# Patient Record
Sex: Male | Born: 1940
Health system: Southern US, Community
[De-identification: ages and names within clinical notes are randomized; demographics above are authoritative.]

## PROBLEM LIST (undated history)

## (undated) DIAGNOSIS — Z9889 Other specified postprocedural states: Secondary | ICD-10-CM

## (undated) DIAGNOSIS — K222 Esophageal obstruction: Secondary | ICD-10-CM

## (undated) DIAGNOSIS — T4145XA Adverse effect of unspecified anesthetic, initial encounter: Secondary | ICD-10-CM

## (undated) DIAGNOSIS — Z8719 Personal history of other diseases of the digestive system: Secondary | ICD-10-CM

## (undated) DIAGNOSIS — I4510 Unspecified right bundle-branch block: Secondary | ICD-10-CM

## (undated) DIAGNOSIS — Z955 Presence of coronary angioplasty implant and graft: Secondary | ICD-10-CM

## (undated) DIAGNOSIS — K219 Gastro-esophageal reflux disease without esophagitis: Secondary | ICD-10-CM

## (undated) DIAGNOSIS — Z860100 Personal history of colon polyps, unspecified: Secondary | ICD-10-CM

## (undated) DIAGNOSIS — G4733 Obstructive sleep apnea (adult) (pediatric): Secondary | ICD-10-CM

## (undated) DIAGNOSIS — T8859XA Other complications of anesthesia, initial encounter: Secondary | ICD-10-CM

## (undated) DIAGNOSIS — I10 Essential (primary) hypertension: Secondary | ICD-10-CM

## (undated) DIAGNOSIS — R112 Nausea with vomiting, unspecified: Secondary | ICD-10-CM

## (undated) DIAGNOSIS — R399 Unspecified symptoms and signs involving the genitourinary system: Secondary | ICD-10-CM

## (undated) DIAGNOSIS — I2510 Atherosclerotic heart disease of native coronary artery without angina pectoris: Secondary | ICD-10-CM

## (undated) DIAGNOSIS — Z8679 Personal history of other diseases of the circulatory system: Secondary | ICD-10-CM

## (undated) DIAGNOSIS — N400 Enlarged prostate without lower urinary tract symptoms: Secondary | ICD-10-CM

## (undated) DIAGNOSIS — K589 Irritable bowel syndrome without diarrhea: Secondary | ICD-10-CM

## (undated) DIAGNOSIS — I2109 ST elevation (STEMI) myocardial infarction involving other coronary artery of anterior wall: Secondary | ICD-10-CM

## (undated) DIAGNOSIS — M1990 Unspecified osteoarthritis, unspecified site: Secondary | ICD-10-CM

## (undated) DIAGNOSIS — E785 Hyperlipidemia, unspecified: Secondary | ICD-10-CM

## (undated) HISTORY — DX: Obstructive sleep apnea (adult) (pediatric): G47.33

## (undated) HISTORY — PX: CORONARY ANGIOPLASTY WITH STENT PLACEMENT: SHX49

## (undated) HISTORY — PX: CARDIOVASCULAR STRESS TEST: SHX262

## (undated) HISTORY — DX: Atherosclerotic heart disease of native coronary artery without angina pectoris: I25.10

## (undated) HISTORY — DX: Essential (primary) hypertension: I10

## (undated) HISTORY — DX: Unspecified osteoarthritis, unspecified site: M19.90

## (undated) HISTORY — DX: Gastro-esophageal reflux disease without esophagitis: K21.9

## (undated) HISTORY — PX: CHOLECYSTECTOMY: SHX55

## (undated) HISTORY — PX: APPENDECTOMY: SHX54

---

## 2000-11-05 ENCOUNTER — Encounter: Payer: Self-pay | Admitting: *Deleted

## 2000-11-05 ENCOUNTER — Emergency Department (HOSPITAL_COMMUNITY): Admission: EM | Admit: 2000-11-05 | Discharge: 2000-11-05 | Payer: Self-pay | Admitting: *Deleted

## 2000-12-28 ENCOUNTER — Encounter: Payer: Self-pay | Admitting: Neurosurgery

## 2000-12-28 ENCOUNTER — Encounter: Admission: RE | Admit: 2000-12-28 | Discharge: 2000-12-28 | Payer: Self-pay | Admitting: Neurosurgery

## 2001-01-22 ENCOUNTER — Encounter: Payer: Self-pay | Admitting: Neurosurgery

## 2001-01-22 ENCOUNTER — Encounter: Admission: RE | Admit: 2001-01-22 | Discharge: 2001-01-22 | Payer: Self-pay | Admitting: Neurosurgery

## 2001-02-04 ENCOUNTER — Encounter: Payer: Self-pay | Admitting: Neurosurgery

## 2001-02-04 ENCOUNTER — Encounter: Admission: RE | Admit: 2001-02-04 | Discharge: 2001-02-04 | Payer: Self-pay | Admitting: Neurosurgery

## 2001-04-28 ENCOUNTER — Ambulatory Visit (HOSPITAL_COMMUNITY): Admission: RE | Admit: 2001-04-28 | Discharge: 2001-04-28 | Payer: Self-pay | Admitting: Internal Medicine

## 2001-11-24 ENCOUNTER — Encounter: Payer: Self-pay | Admitting: Emergency Medicine

## 2001-11-24 ENCOUNTER — Inpatient Hospital Stay (HOSPITAL_COMMUNITY): Admission: EM | Admit: 2001-11-24 | Discharge: 2001-11-27 | Payer: Self-pay | Admitting: Emergency Medicine

## 2001-11-25 ENCOUNTER — Encounter: Payer: Self-pay | Admitting: *Deleted

## 2002-07-20 ENCOUNTER — Encounter: Payer: Self-pay | Admitting: Orthopedic Surgery

## 2002-07-27 ENCOUNTER — Encounter: Payer: Self-pay | Admitting: Orthopedic Surgery

## 2002-07-27 ENCOUNTER — Inpatient Hospital Stay (HOSPITAL_COMMUNITY): Admission: RE | Admit: 2002-07-27 | Discharge: 2002-08-01 | Payer: Self-pay | Admitting: Orthopedic Surgery

## 2002-07-27 HISTORY — PX: TOTAL KNEE ARTHROPLASTY: SHX125

## 2003-09-07 ENCOUNTER — Inpatient Hospital Stay (HOSPITAL_COMMUNITY): Admission: RE | Admit: 2003-09-07 | Discharge: 2003-09-12 | Payer: Self-pay | Admitting: Orthopedic Surgery

## 2003-09-07 HISTORY — PX: TOTAL HIP ARTHROPLASTY: SHX124

## 2003-12-26 ENCOUNTER — Encounter: Admission: RE | Admit: 2003-12-26 | Discharge: 2003-12-26 | Payer: Self-pay | Admitting: Neurosurgery

## 2004-01-14 ENCOUNTER — Encounter: Admission: RE | Admit: 2004-01-14 | Discharge: 2004-01-14 | Payer: Self-pay | Admitting: Neurosurgery

## 2004-01-28 ENCOUNTER — Encounter: Admission: RE | Admit: 2004-01-28 | Discharge: 2004-01-28 | Payer: Self-pay | Admitting: Neurosurgery

## 2004-02-01 ENCOUNTER — Ambulatory Visit (HOSPITAL_COMMUNITY): Admission: RE | Admit: 2004-02-01 | Discharge: 2004-02-01 | Payer: Self-pay | Admitting: Pulmonary Disease

## 2004-02-09 ENCOUNTER — Ambulatory Visit (HOSPITAL_COMMUNITY): Admission: RE | Admit: 2004-02-09 | Discharge: 2004-02-09 | Payer: Self-pay | Admitting: Pulmonary Disease

## 2004-04-05 ENCOUNTER — Ambulatory Visit (HOSPITAL_COMMUNITY): Admission: RE | Admit: 2004-04-05 | Discharge: 2004-04-05 | Payer: Self-pay | Admitting: Internal Medicine

## 2004-04-05 HISTORY — PX: ESOPHAGOGASTRODUODENOSCOPY: SHX1529

## 2005-08-01 ENCOUNTER — Ambulatory Visit: Payer: Self-pay | Admitting: Cardiology

## 2005-09-13 ENCOUNTER — Ambulatory Visit: Payer: Self-pay

## 2006-03-31 ENCOUNTER — Encounter: Admission: RE | Admit: 2006-03-31 | Discharge: 2006-03-31 | Payer: Self-pay | Admitting: Neurosurgery

## 2006-07-17 ENCOUNTER — Ambulatory Visit: Payer: Self-pay | Admitting: Cardiology

## 2006-07-30 ENCOUNTER — Ambulatory Visit: Payer: Self-pay

## 2006-07-30 LAB — CONVERTED CEMR LAB
ALT: 24 units/L (ref 0–40)
AST: 28 units/L (ref 0–37)
Albumin: 3.5 g/dL (ref 3.5–5.2)
Alkaline Phosphatase: 93 units/L (ref 39–117)
BUN: 14 mg/dL (ref 6–23)
Bilirubin, Direct: 0.1 mg/dL (ref 0.0–0.3)
CO2: 28 meq/L (ref 19–32)
Calcium: 9.1 mg/dL (ref 8.4–10.5)
Chloride: 108 meq/L (ref 96–112)
Cholesterol: 109 mg/dL (ref 0–200)
Creatinine, Ser: 1.4 mg/dL (ref 0.4–1.5)
GFR calc Af Amer: 66 mL/min
GFR calc non Af Amer: 54 mL/min
Glucose, Bld: 103 mg/dL — ABNORMAL HIGH (ref 70–99)
HDL: 38 mg/dL — ABNORMAL LOW (ref >39.0)
LDL Cholesterol: 59 mg/dL (ref 0–99)
Potassium: 4.2 meq/L (ref 3.5–5.1)
Sodium: 140 meq/L (ref 135–145)
Total Bilirubin: 0.4 mg/dL (ref 0.3–1.2)
Total CHOL/HDL Ratio: 2.9
Total Protein: 6.9 g/dL (ref 6.0–8.3)
Triglycerides: 59 mg/dL (ref 0–149)
VLDL: 12 mg/dL (ref 0–40)

## 2006-07-31 ENCOUNTER — Ambulatory Visit: Payer: Self-pay

## 2006-12-05 ENCOUNTER — Ambulatory Visit (HOSPITAL_COMMUNITY): Admission: RE | Admit: 2006-12-05 | Discharge: 2006-12-05 | Payer: Self-pay | Admitting: Pulmonary Disease

## 2006-12-16 ENCOUNTER — Ambulatory Visit: Payer: Self-pay | Admitting: Orthopedic Surgery

## 2007-01-27 ENCOUNTER — Ambulatory Visit (HOSPITAL_COMMUNITY): Admission: RE | Admit: 2007-01-27 | Discharge: 2007-01-27 | Payer: Self-pay | Admitting: Pulmonary Disease

## 2007-01-29 ENCOUNTER — Ambulatory Visit: Payer: Self-pay | Admitting: Internal Medicine

## 2008-09-12 ENCOUNTER — Emergency Department (HOSPITAL_COMMUNITY): Admission: EM | Admit: 2008-09-12 | Discharge: 2008-09-12 | Payer: Self-pay | Admitting: Emergency Medicine

## 2008-10-20 DIAGNOSIS — I2510 Atherosclerotic heart disease of native coronary artery without angina pectoris: Secondary | ICD-10-CM

## 2008-10-20 DIAGNOSIS — Z9861 Coronary angioplasty status: Secondary | ICD-10-CM

## 2008-10-20 DIAGNOSIS — I4892 Unspecified atrial flutter: Secondary | ICD-10-CM

## 2008-10-20 DIAGNOSIS — Z87898 Personal history of other specified conditions: Secondary | ICD-10-CM | POA: Insufficient documentation

## 2008-10-20 DIAGNOSIS — M129 Arthropathy, unspecified: Secondary | ICD-10-CM | POA: Insufficient documentation

## 2008-10-20 DIAGNOSIS — E7800 Pure hypercholesterolemia, unspecified: Secondary | ICD-10-CM

## 2008-10-20 DIAGNOSIS — E785 Hyperlipidemia, unspecified: Secondary | ICD-10-CM | POA: Insufficient documentation

## 2008-10-20 DIAGNOSIS — M4800 Spinal stenosis, site unspecified: Secondary | ICD-10-CM

## 2008-10-20 DIAGNOSIS — I10 Essential (primary) hypertension: Secondary | ICD-10-CM | POA: Insufficient documentation

## 2008-10-20 DIAGNOSIS — R1319 Other dysphagia: Secondary | ICD-10-CM

## 2008-10-20 DIAGNOSIS — K449 Diaphragmatic hernia without obstruction or gangrene: Secondary | ICD-10-CM | POA: Insufficient documentation

## 2008-10-20 DIAGNOSIS — K219 Gastro-esophageal reflux disease without esophagitis: Secondary | ICD-10-CM

## 2008-10-21 ENCOUNTER — Ambulatory Visit: Payer: Self-pay | Admitting: Cardiology

## 2008-10-21 ENCOUNTER — Encounter: Payer: Self-pay | Admitting: Cardiology

## 2008-10-21 DIAGNOSIS — R0602 Shortness of breath: Secondary | ICD-10-CM

## 2008-10-21 LAB — CONVERTED CEMR LAB
ALT: 27 units/L (ref 0–53)
AST: 29 units/L (ref 0–37)
Albumin: 3.4 g/dL — ABNORMAL LOW (ref 3.5–5.2)
Alkaline Phosphatase: 100 units/L (ref 39–117)
BUN: 12 mg/dL (ref 6–23)
Bilirubin, Direct: 0.1 mg/dL (ref 0.0–0.3)
CO2: 28 meq/L (ref 19–32)
Calcium: 9 mg/dL (ref 8.4–10.5)
Chloride: 106 meq/L (ref 96–112)
Cholesterol: 229 mg/dL — ABNORMAL HIGH (ref 0–200)
Creatinine, Ser: 1.3 mg/dL (ref 0.4–1.5)
Direct LDL: 161.3 mg/dL
GFR calc non Af Amer: 70.85 mL/min (ref >60)
Glucose, Bld: 88 mg/dL (ref 70–99)
HDL: 47.3 mg/dL (ref >39.00)
Potassium: 4.7 meq/L (ref 3.5–5.1)
Sodium: 139 meq/L (ref 135–145)
Total Bilirubin: 0.6 mg/dL (ref 0.3–1.2)
Total CHOL/HDL Ratio: 5
Total Protein: 6.7 g/dL (ref 6.0–8.3)
Triglycerides: 83 mg/dL (ref 0.0–149.0)
VLDL: 16.6 mg/dL (ref 0.0–40.0)

## 2008-10-27 ENCOUNTER — Telehealth (INDEPENDENT_AMBULATORY_CARE_PROVIDER_SITE_OTHER): Payer: Self-pay | Admitting: *Deleted

## 2008-10-28 ENCOUNTER — Ambulatory Visit: Payer: Self-pay

## 2008-11-01 ENCOUNTER — Encounter: Payer: Self-pay | Admitting: Cardiology

## 2008-11-11 ENCOUNTER — Ambulatory Visit: Payer: Self-pay | Admitting: Internal Medicine

## 2008-11-12 DIAGNOSIS — R1013 Epigastric pain: Secondary | ICD-10-CM

## 2008-11-12 DIAGNOSIS — K3189 Other diseases of stomach and duodenum: Secondary | ICD-10-CM | POA: Insufficient documentation

## 2009-02-10 ENCOUNTER — Telehealth (INDEPENDENT_AMBULATORY_CARE_PROVIDER_SITE_OTHER): Payer: Self-pay

## 2009-03-10 ENCOUNTER — Ambulatory Visit: Payer: Self-pay | Admitting: Internal Medicine

## 2009-03-28 ENCOUNTER — Encounter: Payer: Self-pay | Admitting: Internal Medicine

## 2009-06-02 ENCOUNTER — Telehealth (INDEPENDENT_AMBULATORY_CARE_PROVIDER_SITE_OTHER): Payer: Self-pay

## 2009-06-02 ENCOUNTER — Encounter: Payer: Self-pay | Admitting: Internal Medicine

## 2009-06-30 ENCOUNTER — Telehealth (INDEPENDENT_AMBULATORY_CARE_PROVIDER_SITE_OTHER): Payer: Self-pay

## 2009-07-08 ENCOUNTER — Telehealth: Payer: Self-pay | Admitting: Internal Medicine

## 2009-07-11 ENCOUNTER — Ambulatory Visit: Payer: Self-pay | Admitting: Internal Medicine

## 2009-07-11 DIAGNOSIS — R109 Unspecified abdominal pain: Secondary | ICD-10-CM | POA: Insufficient documentation

## 2009-07-14 ENCOUNTER — Ambulatory Visit: Payer: Self-pay | Admitting: Internal Medicine

## 2009-07-21 ENCOUNTER — Ambulatory Visit (HOSPITAL_COMMUNITY): Admission: RE | Admit: 2009-07-21 | Discharge: 2009-07-21 | Payer: Self-pay | Admitting: Pulmonary Disease

## 2009-08-16 ENCOUNTER — Telehealth (INDEPENDENT_AMBULATORY_CARE_PROVIDER_SITE_OTHER): Payer: Self-pay | Admitting: *Deleted

## 2009-10-06 ENCOUNTER — Telehealth (INDEPENDENT_AMBULATORY_CARE_PROVIDER_SITE_OTHER): Payer: Self-pay

## 2009-11-21 ENCOUNTER — Telehealth (INDEPENDENT_AMBULATORY_CARE_PROVIDER_SITE_OTHER): Payer: Self-pay | Admitting: *Deleted

## 2010-01-16 ENCOUNTER — Telehealth (INDEPENDENT_AMBULATORY_CARE_PROVIDER_SITE_OTHER): Payer: Self-pay | Admitting: *Deleted

## 2010-03-21 ENCOUNTER — Telehealth (INDEPENDENT_AMBULATORY_CARE_PROVIDER_SITE_OTHER): Payer: Self-pay

## 2010-04-20 ENCOUNTER — Telehealth (INDEPENDENT_AMBULATORY_CARE_PROVIDER_SITE_OTHER): Payer: Self-pay

## 2010-06-15 ENCOUNTER — Telehealth (INDEPENDENT_AMBULATORY_CARE_PROVIDER_SITE_OTHER): Payer: Self-pay

## 2010-06-27 ENCOUNTER — Telehealth (INDEPENDENT_AMBULATORY_CARE_PROVIDER_SITE_OTHER): Payer: Self-pay

## 2010-07-14 ENCOUNTER — Emergency Department (HOSPITAL_COMMUNITY)
Admission: EM | Admit: 2010-07-14 | Discharge: 2010-07-14 | Payer: Self-pay | Source: Home / Self Care | Admitting: Emergency Medicine

## 2010-07-17 LAB — DIFFERENTIAL
Basophils Absolute: 0 10*3/uL (ref 0.0–0.1)
Basophils Relative: 0 % (ref 0–1)
Eosinophils Absolute: 0.2 10*3/uL (ref 0.0–0.7)
Eosinophils Relative: 5 % (ref 0–5)
Lymphocytes Relative: 26 % (ref 12–46)
Lymphs Abs: 1.2 10*3/uL (ref 0.7–4.0)
Monocytes Absolute: 0.4 10*3/uL (ref 0.1–1.0)
Monocytes Relative: 9 % (ref 3–12)
Neutro Abs: 2.7 10*3/uL (ref 1.7–7.7)
Neutrophils Relative %: 59 % (ref 43–77)

## 2010-07-17 LAB — POCT CARDIAC MARKERS
CKMB, poc: 10.2 ng/mL (ref 1.0–8.0)
CKMB, poc: 6.8 ng/mL (ref 1.0–8.0)
Myoglobin, poc: 291 ng/mL (ref 12–200)
Myoglobin, poc: 215 ng/mL (ref 12–200)
Troponin i, poc: <0.05 ng/mL (ref 0.00–0.09)
Troponin i, poc: <0.05 ng/mL (ref 0.00–0.09)

## 2010-07-17 LAB — CBC
HCT: 44.2 % (ref 39.0–52.0)
Hemoglobin: 14.9 g/dL (ref 13.0–17.0)
MCH: 27.1 pg (ref 26.0–34.0)
MCHC: 33.7 g/dL (ref 30.0–36.0)
MCV: 80.5 fL (ref 78.0–100.0)
Platelets: 166 10*3/uL (ref 150–400)
RBC: 5.49 MIL/uL (ref 4.22–5.81)
RDW: 17.3 % — ABNORMAL HIGH (ref 11.5–15.5)
WBC: 4.5 10*3/uL (ref 4.0–10.5)

## 2010-07-17 LAB — BASIC METABOLIC PANEL
BUN: 10 mg/dL (ref 6–23)
CO2: 25 mEq/L (ref 19–32)
Calcium: 8.9 mg/dL (ref 8.4–10.5)
Chloride: 103 mEq/L (ref 96–112)
Creatinine, Ser: 1.26 mg/dL (ref 0.4–1.5)
GFR calc Af Amer: >60 mL/min (ref >60)
GFR calc non Af Amer: 57 mL/min — ABNORMAL LOW (ref >60)
Glucose, Bld: 125 mg/dL — ABNORMAL HIGH (ref 70–99)
Potassium: 4 mEq/L (ref 3.5–5.1)
Sodium: 135 mEq/L (ref 135–145)

## 2010-07-20 ENCOUNTER — Encounter (INDEPENDENT_AMBULATORY_CARE_PROVIDER_SITE_OTHER): Payer: Self-pay | Admitting: *Deleted

## 2010-08-01 NOTE — Assessment & Plan Note (Signed)
Summary: CRD/GU  pt returned iftobt and it was negative  Allergies: 1)  ! Codeine  Other Orders: Immuno-chemical Fecal Occult (16109)

## 2010-08-01 NOTE — Progress Notes (Signed)
Summary: request for Nexium  Phone Note Call from Patient   Caller: Patient Summary of Call: Pt left VM requesting samples of Nexium. #30 samples at front for pick-up. Initial call taken by: Cloria Spring LPN,  October 06, 2009 1:43 PM

## 2010-08-01 NOTE — Progress Notes (Signed)
Summary: samples for pt  Phone Note Call from Patient   Summary of Call: Gregory Wilson said the RMR told him he could come by to pick up Nexium samples. I put 6 boxes of 5 in the sample drawer up front. Pt will come by in the morning to pick up. Initial call taken by: Diana Eves,  August 16, 2009 2:52 PM

## 2010-08-01 NOTE — Assessment & Plan Note (Signed)
Summary: STOMACH PROBLEMS/SS   Visit Type:  Follow-up Visit Primary Care Provider:  hawkins  Chief Complaint:  follow up- doing good as long as he takes nexium.  History of Present Illness: Mr. Neisler has done well on Nexium for gastroesophageal reflux disease. If he  misses even a single day he has recurrent symptoms. He remained significantly over his ideal body weight at 311 pounds but he is down 8 pounds from what he weighed in 2012.  Is not having any dysphagia symptoms. He was found to have a Schatzki's ring back in 2005 which was dilated. He status post triple drug therapy for H. pylori. His last colonoscopy was in 2002. He is due for routine screening in 2012. He recently turned returned a stool sample which was negative on the immunofecal occult blood test test.  Current Problems (verified): 1)  Abdominal Pain  (ICD-789.00) 2)  Dyspepsia  (ICD-536.8) 3)  Hiatal Hernia  (ICD-553.3) 4)  Hypercholesterolemia  (ICD-272.0) 5)  Acid Reflux Disease  (ICD-530.81) 6)  Dyspnea  (ICD-786.05) 7)  Arthritis  (ICD-716.90) 8)  Hiatal Hernia  (ICD-553.3) 9)  Benign Prostatic Hypertrophy, Hx of  (ICD-V13.8) 10)  Hypercholesterolemia  (ICD-272.0) 11)  Other Dysphagia  (ICD-787.29) 12)  Gerd  (ICD-530.81) 13)  Hyperlipidemia  (ICD-272.4) 14)  Hypertension  (ICD-401.9) 15)  Atrial Flutter  (ICD-427.32) 16)  Cad  (ICD-414.00) 17)  Degenerative Joint Disease  (ICD-715.90)  Current Medications (verified): 1)  Arthrotec 75 75-200 Mg-Mcg Tabs (Diclofenac-Misoprostol) .Marland Kitchen.. 1 Tab By Mouth Once Daily 2)  Lipitor 80 Mg Tabs (Atorvastatin Calcium) .... Take One Tablet By Mouth Daily. 3)  Metoprolol Succinate 100 Mg Xr24h-Tab (Metoprolol Succinate) .... Take One Tablet By Mouth Daily 4)  Avapro 150 Mg Tabs (Irbesartan) .... Once Daily 5)  Antivert 25mg  .... As Needed 6)  Darvocet 100mg  .... As Needed 7)  Ultram 100mg  .... As Needed 8)  Hydrochlorothiazide 25 Mg Tabs (Hydrochlorothiazide) .... Take  1/2 Tablet Every Day 9)  Aspirin Es Back and Body .... Take 1 To 2 Tablets Every Day 10)  Nexium 40 Mg Cpdr (Esomeprazole Magnesium) .... Take 1 Tablet By Mouth Once A Day 11)  Beta Prostate .... Take 1 Tablet By Mouth Two Times A Day  Allergies (verified): 1)  ! Codeine  Past History:  Past Medical History: Last updated: 10/21/2008 Current Problems:  ARTHRITIS (ICD-716.90) HIATAL HERNIA (ICD-553.3) BENIGN PROSTATIC HYPERTROPHY, HX OF (ICD-V13.8) HYPERCHOLESTEROLEMIA (ICD-272.0) OTHER DYSPHAGIA (VHQ-469.62) GERD (ICD-530.81) HYPERTENSION (ICD-401.9) Brief ATRIAL FLUTTER on treadmill(ICD-427.32) CAD (ICD-414.00) DEGENERATIVE JOINT DISEASE (ICD-715.90)  Past Surgical History: Last updated: 11/10/2008 status post left hip 2005 left knee replacement 2004  cholecystectomy.   bilateral shoulder replacements.  EGD and Maloney dilation followed by biopsy.  colonoscopy   status post triple-drug therapy.  Family History: Last updated: 10/20/2008  No known family history of colorectal carcinoma, no liver  or chronic GI problems.  Mother deceased at age 74 secondary to  hypertension, coronary artery disease, and renal failure.  Father deceased  at age 39 with history significant for lung carcinoma, although he was a  smoker.  He has two live sisters, one with history of breast carcinoma and  one with history of hypertension.  Social History: Last updated: 10/20/2008 retired from [employer]. Tobacco Use - No.  Alcohol Use - yes  Risk Factors: Smoking Status: never (10/20/2008)  Vital Signs:  Patient profile:   70 year old male Height:      74 inches Weight:  311 pounds BMI:     40.07 Temp:     97.7 degrees F oral Pulse rate:   76 / minute BP sitting:   138 / 88  (right arm) Cuff size:   large  Vitals Entered By: Hendricks Limes LPN (July 14, 2009 9:41 AM)  Physical Exam  General:  very pleasant obese gentleman resting comfortably Eyes:  no  scleral icterus or pallor Lungs:  clear to auscultation Heart:  regular rate and rhythm without murmur gallop rub Abdomen:  nondistended positive bowel sounds soft nontender without mass or organomegaly  Impression & Recommendations: Impression: 70 year old morbidly obese gentleman with gastroesophageal reflux disease. Symptoms well-controlled on Nexium 40 mg once daily. Benefits of this approach outweighed any theoretical risks.  Recommendations: Continue Nexium 40 mg orally daily. Samples provided. Prescription provided. Reviewed antireflux lifestyle/diet. I suggested that if Mr. Stafford could lose 30 pounds over the next 12-15 months this would go along way combating  his reflux symptoms as well would   benefit his health over all.  Colorectal cancer screening. He'll be due for routine screening 2012. Plan to see her back in office in one year for recheck and to set up a colonoscopy.  Appended Document: Orders Update-charge    Clinical Lists Changes  Orders: Added new Service order of Est. Patient Level III (59563) - Signed

## 2010-08-01 NOTE — Progress Notes (Signed)
Summary: samples  Phone Note Call from Patient   Summary of Call: pt called to get Nexium samples, Samule Dry for 5 boxes of 5. Pt coming tomorrow morning. Initial call taken by: Diana Eves,  January 16, 2010 11:49 AM

## 2010-08-01 NOTE — Progress Notes (Signed)
Summary: phone note/ samples of Nexium  Phone Note Call from Patient   Caller: Patient Summary of Call: Pt requested samples of Nexium. # 20 samples of Nexium 40 mg at front for pick-up. Initial call taken by: Cloria Spring LPN,  April 20, 2010 3:22 PM

## 2010-08-01 NOTE — Progress Notes (Signed)
Summary: nexium samples  Phone Note Call from Patient   Caller: Patient Summary of Call: Pt called and requested samples of Nexium. # 20 left at front for pick up. Initial call taken by: Cloria Spring LPN,  March 21, 2010 11:09 AM

## 2010-08-01 NOTE — Progress Notes (Signed)
Summary: samples  Phone Note Call from Patient   Summary of Call: Pt called requesting Nexium samples, I have 6 boxes of 5 up front for pt/

## 2010-08-01 NOTE — Progress Notes (Signed)
Summary: LMOM we need the ifob sample, please call  Phone Note Outgoing Call   Call placed by: Doris Call placed to: Patient Summary of Call: [facility] we need the sample, please call at once. Initial call taken by: Cloria Spring LPN,  July 08, 2009 11:45 AM     Appended Document: LMOM we need the ifob sample, please call Letter mailed for pt to return iFOB also.

## 2010-08-03 NOTE — Progress Notes (Signed)
Summary: pt requests samples of Nexium  Phone Note Call from Patient   Caller: Patient Summary of Call: Pt called and requested samples of Nexium. Left # 20 at the front desk for him.  Initial call taken by: Cloria Spring LPN,  June 27, 2010 1:48 PM

## 2010-08-03 NOTE — Progress Notes (Signed)
Summary: phone note/pt requests Nexium  Phone Note Call from Patient   Caller: Patient Summary of Call: Pt called and asked for samples of Nexium. I told him that we do not have any. He wants to know if we have samples of anything we could let him have. I told him i will check and see what provider recommends. Initial call taken by: Cloria Spring LPN,  June 15, 2010 11:45 AM     Appended Document: phone note/pt requests Nexium he could try dexilant. two week trial  Appended Document: phone note/pt requests Nexium Called and no answer.  Appended Document: phone note/pt requests Nexium Pt informed. Dexilant #15 at front for pick-up.

## 2010-08-03 NOTE — Letter (Signed)
Summary: Recall Office Visit  [facility]  [address]   [place], [place] [address]   Phone: [phone]  Fax: [phone]      July 20, 2010   PHILOPATEER STRINE [address] [place], [place]  [address] October 20, 1940   Dear Mr. BARTUNEK,   According to our records, it is time for you to schedule a follow-up office visit with [place].   At your convenience, please call [phone] to schedule an office visit. If you have any questions, concerns, or feel that this letter is in error, we would appreciate your call.   Sincerely,    Diana Eves  [facility] Ph: [phone]   Fax: [phone]

## 2010-08-09 ENCOUNTER — Ambulatory Visit: Payer: Self-pay | Admitting: Cardiology

## 2010-08-17 ENCOUNTER — Telehealth (INDEPENDENT_AMBULATORY_CARE_PROVIDER_SITE_OTHER): Payer: Self-pay

## 2010-08-23 NOTE — Progress Notes (Signed)
Summary: nexium samples  Phone Note Call from Patient Call back at Home Phone [phone]   Caller: Patient Summary of Call: pt called requesting Nexium samples- left #20 at front desk and informed pt that he needed to schedule a return OV. He agreed to speak with front office and schedule.  Initial call taken by: Hendricks Limes LPN,  August 17, 2010 10:47 AM     Appended Document: nexium samples APPOINTMENT MADE FOR [facility] 09/12/10 WITH ANNA

## 2010-09-05 ENCOUNTER — Encounter: Payer: Self-pay | Admitting: Cardiology

## 2010-09-06 ENCOUNTER — Encounter: Payer: Self-pay | Admitting: Gastroenterology

## 2010-09-12 ENCOUNTER — Encounter: Payer: Self-pay | Admitting: Gastroenterology

## 2010-09-12 ENCOUNTER — Encounter: Payer: Self-pay | Admitting: Internal Medicine

## 2010-09-12 ENCOUNTER — Ambulatory Visit (INDEPENDENT_AMBULATORY_CARE_PROVIDER_SITE_OTHER): Payer: Medicare PPO | Admitting: Gastroenterology

## 2010-09-12 DIAGNOSIS — R1319 Other dysphagia: Secondary | ICD-10-CM

## 2010-09-12 DIAGNOSIS — Z1211 Encounter for screening for malignant neoplasm of colon: Secondary | ICD-10-CM

## 2010-09-12 DIAGNOSIS — K219 Gastro-esophageal reflux disease without esophagitis: Secondary | ICD-10-CM

## 2010-09-19 ENCOUNTER — Encounter: Payer: Self-pay | Admitting: Cardiology

## 2010-09-19 ENCOUNTER — Ambulatory Visit (INDEPENDENT_AMBULATORY_CARE_PROVIDER_SITE_OTHER): Payer: Medicare PPO | Admitting: Cardiology

## 2010-09-19 DIAGNOSIS — I2510 Atherosclerotic heart disease of native coronary artery without angina pectoris: Secondary | ICD-10-CM

## 2010-09-19 DIAGNOSIS — I4892 Unspecified atrial flutter: Secondary | ICD-10-CM

## 2010-09-19 DIAGNOSIS — E785 Hyperlipidemia, unspecified: Secondary | ICD-10-CM

## 2010-09-19 DIAGNOSIS — I10 Essential (primary) hypertension: Secondary | ICD-10-CM

## 2010-09-19 MED ORDER — IRBESARTAN 300 MG PO TABS: 300.0000 mg | ORAL_TABLET | Freq: Every day | ORAL | Status: DC

## 2010-09-19 NOTE — Assessment & Plan Note (Signed)
Summary: 1 YR F/U GERD/GU/LAW   Vital Signs:  Patient profile:   70 year old male Height:      71.5 inches Weight:      311 pounds BMI:     42.93 Temp:     98.2 degrees F oral Pulse rate:   68 / minute BP sitting:   132 / 92  (left arm)  Vitals Entered By: Carolan Clines LPN (September 12, 2010 9:36 AM)  Visit Type:  Follow-up Visit Primary Care Provider:  hawkins   History of Present Illness: Gregory Wilson is a pleasant 69 year old male who presents today in f/u for GERD, as well as to make an appt for routine screening colonoscopy. Last was in 2002, normal. Also hx of H.pylori, s/p treatment. His insurance does not cover Nexium, yet this is the only PPI that works for him. Taking daily. Does have hx of Schatzki's ring s/p dilation in 2005. Reports intermittent dysphagia for about 6-8 mos, not severe. No abdominal pain.  Has a BM 2x/week. Not on high fiber diet. Not really on bowel regimen. No melena or brbpr.  Wt is stable at 311, same as Jan 2011.   Current Medications (verified): 1)  Meloxicam 7.5 Mg Tabs (Meloxicam) .... Take One Once Daily 2)  Lipitor 80 Mg Tabs (Atorvastatin Calcium) .... Take One Tablet By Mouth Daily. 3)  Avapro 150 Mg Tabs (Irbesartan) .... Once Daily 4)  Antivert 25mg  .... As Needed 5)  Darvocet 100mg  .... As Needed 6)  Ultram 100mg  .... As Needed 7)  Hydrochlorothiazide 25 Mg Tabs (Hydrochlorothiazide) .... Take 1/2 Tablet Every Day 8)  Aspirin Es Back and Body .... Take 1 To 2 Tablets Every Day 9)  Nexium 40 Mg Cpdr (Esomeprazole Magnesium) .... Take 1 Tablet By Mouth Once A Day 10)  Beta Prostate .... Take 1 Tablet By Mouth Two Times A Day 11)  Tamsulosin Hcl 0.4 Mg Caps (Tamsulosin Hcl) .... Take One Two Times A Day  Allergies: 1)  ! Codeine  Past History:  Family History: Last updated: 10/20/2008  No known family history of colorectal carcinoma, no liver  or chronic GI problems.  Mother deceased at age 32 secondary to  hypertension, coronary artery  disease, and renal failure.  Father deceased  at age 67 with history significant for lung carcinoma, although he was a  smoker.  He has two live sisters, one with history of breast carcinoma and  one with history of hypertension.  Past Medical History: Reviewed history from 10/21/2008 and no changes required. Current Problems:  ARTHRITIS (ICD-716.90) HIATAL HERNIA (ICD-553.3) BENIGN PROSTATIC HYPERTROPHY, HX OF (ICD-V13.8) HYPERCHOLESTEROLEMIA (ICD-272.0) OTHER DYSPHAGIA (ZOX-096.04) GERD (ICD-530.81) HYPERTENSION (ICD-401.9) Brief ATRIAL FLUTTER on treadmill(ICD-427.32) CAD (ICD-414.00) DEGENERATIVE JOINT DISEASE (ICD-715.90)  Past Surgical History: status post left hip 2005 left knee replacement 2004  cholecystectomy.  EGD and Maloney dilation followed by biopsy.  colonoscopy   status post triple-drug therapy.  Social History: Married, 2 children retired from [employer]. Tobacco Use - No.  Alcohol Use - yes, holidays  Review of Systems General:  Denies fever, chills, and anorexia. Eyes:  Denies blurring, irritation, and discharge. ENT:  Complains of difficulty swallowing; denies sore throat and hoarseness. CV:  Denies chest pains and syncope. Resp:  Denies dyspnea at rest and wheezing. GI:  See HPI. GU:  Denies urinary burning and urinary frequency. MS:  Denies joint pain / LOM, joint swelling, and joint stiffness. Derm:  Denies rash, itching, and dry skin. Neuro:  Denies  weakness and syncope. Psych:  Denies depression and anxiety. Endo:  Denies cold intolerance and heat intolerance.  Physical Exam  General:  Well developed, well nourished, no acute distress. Head:  Normocephalic and atraumatic. Eyes:  sclera without icterus Mouth:  No deformity or lesions, dentition normal. Neck:  Supple; no masses or thyromegaly. Lungs:  Clear throughout to auscultation. Heart:  Regular rate and rhythm; no murmurs, rubs,  or bruits. Abdomen:  obese, +BS, soft,  non-tender, non-distended. No HSM, no rebound or guarding noted.  Msk:  Symmetrical with no gross deformities. Normal posture. Pulses:  Normal pulses noted. Extremities:  No clubbing, cyanosis, edema or deformities noted. Neurologic:  Alert and  oriented x4;  grossly normal neurologically. Skin:  Intact without significant lesions or rashes. Psych:  Alert and cooperative. Normal mood and affect.   Impression & Recommendations:  Problem # 1:  ACID REFLUX DISEASE (ICD-65.36)  70 year old African-American male with hx of chronic GERD, Schatzki's ring s/p dilation in 2005. On nexium daily with good control of reflux-symptoms. recurrent dysphagia x 6-8 mos, likely r/t Schatzki's ring.    Continue Nexium, samples given EGD/ED with Dr. Jena Gauss in near future: the R/B/A have been discussed with pt in detail; stated understanding and has no further questions  Orders: Est. Patient Level II (46962)  Problem # 2:  OTHER DYSPHAGIA (XBM-841.32)  See # 1  Orders: Est. Patient Level II (44010)  Problem # 3:  SCREENING COLORECTAL-CANCER (ICD-V76.51)  due for screening colonoscopy, last in 2002-->nl. c/o constipation. No melena or brbpr.   Fiber supplementation Miralax as needed TCS with Dr. Jena Gauss in near future: the R/B/A have been discussed in detail; pt states understanding and desires to proceed. EGD/ED at same time.   Orders: Est. Patient Level II (27253)  Patient Instructions: 1)  Fiber supplement (such as metamucil, benefiber) 2)  Miralax as needed for constipation 3)  The medication list was reviewed and reconciled.  All changed / newly prescribed medications were explained.  A complete medication list was provided to the patient / caregiver.    Orders Added: 1)  Est. Patient Level II [66440]

## 2010-09-19 NOTE — Assessment & Plan Note (Signed)
Continue statin. Check lipids and liver. 

## 2010-09-19 NOTE — Assessment & Plan Note (Signed)
Blood pressure elevated. Increase Avapro to 300 mg daily. Check potassium and renal function in one week.

## 2010-09-19 NOTE — Assessment & Plan Note (Signed)
Previous bout of atrial flutter on treadmill. Continue aspirin. He declined Coumadin in the past.

## 2010-09-19 NOTE — Letter (Signed)
Summary: TCS/EGD/ED ORDER  TCS/EGD/ED ORDER   Imported By: Ave Filter 09/12/2010 11:43:28  _____________________________________________________________________  External Attachment:    Type:   Image     Comment:   External Document

## 2010-09-19 NOTE — Patient Instructions (Signed)
INCREASE AVAPRO TO 300MG  ONCE DAILY  RETURN TO [address] IN ONE WEEK FASTING FOR LAB WORK  FOLLOW UP APPOINTMENT WITH DR [name] IN ONE YEAR.YOU WILL RECEIVE A LETTER WHEN THAT SCHEDULE IS AVAILABLE

## 2010-09-19 NOTE — Progress Notes (Signed)
HPI: 70 year old male with past medical history of coronary disease who returns for followup. I last saw him in April of 2010. The patient has had prior PCI of his LAD. Last myoview in April of 2010 showed an ejection fraction of 65% and normal perfusion. Since I last saw him, the patient denies any dyspnea on exertion, orthopnea, PND, pedal edema, palpitations, syncope or chest pain.   Current Outpatient Prescriptions  Medication Sig Dispense Refill  . aspirin-acetaminophen-caffeine (EXCEDRIN MIGRAINE) 250-250-65 MG per tablet Take 1 tablet by mouth. 1-2 tablets every day       . atorvastatin (LIPITOR) 80 MG tablet Take 80 mg by mouth daily.        . diclofenac-misoprostol (ARTHROTEC 75) 75-0.2 MG per tablet Take 1 tablet by mouth daily.        Marland Kitchen esomeprazole (NEXIUM) 40 MG capsule Take 40 mg by mouth daily before breakfast.        . hydrochlorothiazide 25 MG tablet Take 25 mg by mouth. Take 1/2 tablet every day       . irbesartan (AVAPRO) 150 MG tablet Take 150 mg by mouth daily.        . meclizine (ANTIVERT) 50 MG tablet Take 50 mg by mouth. 1/2 tablet (25mg ) as needed       . metoprolol (TOPROL-XL) 100 MG 24 hr tablet Take 100 mg by mouth daily.        . Propoxyphene N-Acetaminophen (DARVOCET-N 100 PO) Take by mouth as needed.        Marland Kitchen Specialty Vitamins Products ([employer] PROSTATE HEALTH FORMULA PO) Take by mouth 2 (two) times daily.        . traMADol (ULTRAM-ER) 100 MG 24 hr tablet Take 100 mg by mouth daily as needed.           Past Medical History  Diagnosis Date  . Arthritis   . Hiatal hernia   . History of benign prostatic hypertrophy   . Hypercholesterolemia   . Dysphagia   . GERD (gastroesophageal reflux disease)   . Hypertension   . Atrial flutter     BRIEF/ON TREADMILL  . CAD (coronary artery disease)   . Degenerative joint disease     Past Surgical History  Procedure Date  . Hip surgery 2005  . Replacement total knee 2004    Left  . Cholecystectomy   . Total shoulder  replacement     bilateral  . Upper gastrointestinal endoscopy     amd Maloney dilation followed by biopsy  . Status post triple drug therapy     History   Social History  . Marital Status: Married    Spouse Name: N/A    Number of Children: N/A  . Years of Education: N/A   Occupational History  . Not on file.   Social History Main Topics  . Smoking status: Never Smoker   . Smokeless tobacco: Not on file  . Alcohol Use: No  . Drug Use: No  . Sexually Active: Not on file   Other Topics Concern  . Not on file   Social History Narrative   Retired from [employer]    ROS: Problems with vertigo but no fevers or chills, productive cough, hemoptysis, dysphasia, odynophagia, melena, hematochezia, dysuria, hematuria, rash, seizure activity, orthopnea, PND, pedal edema, claudication. Remaining systems are negative.  Physical Exam: Well-developed well-nourished in no acute distress.  Skin is warm and dry.  HEENT is normal.  Neck is supple. No thyromegaly.  Chest is  clear to auscultation with normal expansion.  Cardiovascular exam is regular rate and rhythm.  Abdominal exam nontender or distended. No masses palpated. Extremities show trace edema. neuro grossly intact  ECG Normal sinus rhythm, right bundle branch block.

## 2010-09-19 NOTE — Assessment & Plan Note (Signed)
Continue present medications. Plan myoview when he returns in one year.

## 2010-09-25 ENCOUNTER — Encounter: Payer: Self-pay | Admitting: Cardiology

## 2010-09-28 ENCOUNTER — Other Ambulatory Visit (INDEPENDENT_AMBULATORY_CARE_PROVIDER_SITE_OTHER): Payer: Medicare PPO | Admitting: *Deleted

## 2010-09-28 DIAGNOSIS — I10 Essential (primary) hypertension: Secondary | ICD-10-CM

## 2010-09-28 DIAGNOSIS — Z79899 Other long term (current) drug therapy: Secondary | ICD-10-CM

## 2010-09-28 DIAGNOSIS — E7800 Pure hypercholesterolemia, unspecified: Secondary | ICD-10-CM

## 2010-09-28 LAB — LIPID PANEL
Cholesterol: 185 mg/dL (ref 0–200)
HDL: 51.4 mg/dL (ref >39.00)
VLDL: 15 mg/dL (ref 0.0–40.0)

## 2010-09-28 LAB — BASIC METABOLIC PANEL
BUN: 11 mg/dL (ref 6–23)
Calcium: 9 mg/dL (ref 8.4–10.5)
GFR: 78 mL/min (ref >60.00)
Glucose, Bld: 87 mg/dL (ref 70–99)
Potassium: 4.2 mEq/L (ref 3.5–5.1)
Sodium: 144 mEq/L (ref 135–145)

## 2010-09-28 LAB — HEPATIC FUNCTION PANEL
ALT: 21 U/L (ref 0–53)
AST: 29 U/L (ref 0–37)
Bilirubin, Direct: 0.1 mg/dL (ref 0.0–0.3)
Total Protein: 6.8 g/dL (ref 6.0–8.3)

## 2010-10-02 ENCOUNTER — Encounter: Payer: Self-pay | Admitting: *Deleted

## 2010-10-23 ENCOUNTER — Telehealth: Payer: Self-pay | Admitting: Cardiology

## 2010-10-23 NOTE — Telephone Encounter (Signed)
Spoke with pt, he called to report his insurance changed his avapro to irbesartan 300mg  once daily. Will make dr [name] aware. Deliah Goody

## 2010-10-23 NOTE — Telephone Encounter (Signed)
Pt insurance company changed his meds and he wants to make sure Dr. [name] is aware of this and ok with it

## 2010-10-23 NOTE — Telephone Encounter (Signed)
Ok  Gregory Wilson  

## 2010-11-07 ENCOUNTER — Telehealth: Payer: Self-pay

## 2010-11-07 NOTE — Telephone Encounter (Signed)
agree

## 2010-11-07 NOTE — Telephone Encounter (Signed)
Pt called requesting nexium samples #30 given

## 2010-11-14 NOTE — Assessment & Plan Note (Signed)
NAME:  Gregory Wilson, Gregory Wilson               CHART#:  [id number]   DATE:  01/29/2007                       DOB:  02-17-1941   The patient is a 70 year old African-American male who returns to see me  today after not having been seen for nearly 3 years to complain of  intermittent abdominal bloating quite a bit.  He does have some reflux  symptoms which Nexium 40 mg daily significant improves but does not  completely abolish.  He is constipated, he has 1 bowel movement every 3  days to weekly.  He has not had any blood per rectum or melena.  He has  gained 20 pounds in the past 6 years.  He does not have any odynophagia  or dysphagia.  No true early satiety.  No nausea or vomiting.  No melena  or hematemesis.  His last colonoscopy was in 2002, this was a normal  exam.  He is due for routine screening in 2012.  He tells me that he has  been complaining of a left flank knot for which he is seeing Dr.  Juanetta Gosling.  In fact, he has had an abdominal and chest CT back on the July  28 which demonstrates no chest abnormalities.  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  He does rarely consumes  alcohol and he does not use tobacco.  Last EGD was in October 2005 which  at the time he was found to have a Schatzki's ring which was dilated.  He is again is not having any dysphagia.   PAST MEDICAL HISTORY:  Degenerative joint disease status post left hip,  left knee replacement.  Status post cholecystectomy.  He tells me he has  seen his neurosurgeon and orthopedist and they both recommended  bilateral shoulder replacements.   CURRENT MEDICATIONS:  1. Toprol XL 100 mg daily  2. Avapro 150 mg daily  3. Lipitor 80 mg daily  4. Arthrotec 75 mg b.i.d.  5. HCTZ 25 mg one-half tablet daily  6. Nexium 40 mg daily  7. Aspirin 81 mg daily   ALLERGIES:  Codeine.   FAMILY HISTORY:  Father died of lung cancer,  mother died with MI.  Two  sisters had breast cancer.   SOCIAL HISTORY:  The patient is married, retired from [employer].  Again no tobacco and only occasional alcohol.   REVIEW OF SYSTEMS:  No chest pain or dyspnea on exertion.  No fever,  chills.  Otherwise as in history of present illness.   PHYSICAL EXAMINATION:  GENERAL:  A pleasant, obese, 70 year old  gentleman resting comfortably.  VITAL SIGNS:  Weight 319, height 6 foot 1 inch.  Temp 99, BP is 120/90,  pulse 60.  SKIN:  Warm and dry.  HEENT:  No sclerae icterus.  Conjunctivae are pink.  Oral cavity: No  lesions.  CHEST/LUNGS:  Clear to auscultation.  CARDIAC:  Regular rate and rhythm, without murmur, gallop or rub.  ABDOMEN:  Obese, positive bowel sounds, soft, nontender.  No obvious  mass or organomegaly.  EXTREMITY EXAM:  No edema.  RECTAL EXAM:  Good sphincter tone.  No mass in rectal vault.  Brown  stool is hemoccult negative.   IMPRESSION:  The patient is a pleasant 70 year old gentleman with  nonspecific symptoms of abdominal bloating and constipation in a setting  of progressive weight gain.  He has gastroesophageal reflux disease  poorly controlled and he is relatively constipated.  Not mentioned  above, he showed me the knot he was complaining of and this is no more  than a bony protuberance along his left costal margin.   RECOMMENDATIONS:  I recommended weight loss.  We will start him on some  Aciphex 20 mg orally daily in lieu of Nexium samples given. We will also  ask him to start some Benefiber 1 tablespoon daily and start MiraLax 17  grams orally at bedtime.  He is to keep a stool diary.  In addition, we  will give him some Mylanta, probiotic supplement once daily and plan to  see [place] back in 1 month.       Jonathon Bellows, M.D.  Electronically Signed     RMR/MEDQ  D:  01/29/2007  T:  01/30/2007  Job:  [id number]   cc:   Ramon Dredge L. Juanetta Gosling, M.D.

## 2010-11-17 NOTE — Cardiovascular Report (Signed)
. [facility]  Patient:    EDU, ON Visit Number: [id number] MRN: [id number]          Service Type: MED Location: [phone] Attending Physician:  Junious Silk Dictated by:   Everardo Beals Juanda Chance, M.D. [facility] Proc. Date: 11/26/01 Admit Date:  11/24/2001 Discharge Date: 11/27/2001   CC:         Madolyn Frieze. [name], M.D. [facility]  Damaris Schooner, M.D.   Cardiac Catheterization  INDICATIONS:  Mr. Bertha is 70 years old and was admitted to the hospital with neck and chest pain felt to have been unstable angina.  He was studied by Rollene Rotunda, M.D. [facility] yesterday and found to have a 70% lesion in the LAD and what was felt to be an 80% lesion in the diagonal branch.  After reviewing the films with Dr. Riley Kill, they felt coronary intervention was indicated.  DESCRIPTION OF PROCEDURE:  The procedure was performed via the left femoral artery using the arterial sheath and a JL4 7 [place] guiding catheter. We initially used a Forte marker wire.  The patient was given Angiomax bolus and infusion.  We passed the marker wire down the LAD without difficulty.  We decided to stent beyond the diagonal branch with a 3.0 x 13 mm Cypher stent and we deployed this with one inflation of 15 atmospheres for 40 seconds.  We then postdilated the distal and midaspects of this stent with a 3.5 x 8 mm Quantum Maverick performing two inflations at 14 and 10 atmospheres for 38 and 27 seconds. Following stenting and postdilatation, the flow decreased from TIMI 3 to TIMI 2 flow and this was treated with intracoronary Verapamil.  We then approached the diagonal branch.  We rewired the branch with the same wire and used a 2.75 x 6 mm cutting balloon.  We performed three inflations at 10 atmospheres for 47 seconds.  This resulted in a suboptimal opening of the diagonal branch to the LAD and also degredated the flow from TIMI 3 back to TIMI 2 flow requiring intracoronary  Verapamil.  We made the decision to stent the ostium and used a 2.75 x 8 mm Express and deployed this with one inflation of 14 atmospheres for 60 seconds.  The patient also received repeated doses of intracoronary Verapamil.  He tolerated the procedure well and left the laboratory in satisfactory condition.  RESULTS:  Initially the stenosis in the proximal LAD was estimated at 70%. Following stenting this improved to less than 10%.  Initially the stenosis in the diagonal branch of the LAD was estimated at 70 to 80%.  Following stenting, this improved to less than 10%.  CONCLUSION:  Successful percutaneous coronary intervention of the LAD and diagonal branch with improvement in the LAD stenosis from 70% to less than 10% with a Cypher stent and improvement in the ostial lesion in the diagonal branch of the LAD from 80% to less than 10% with an Express stent.  DISPOSITION:  The patient was returned to the postanesthetic unit for further observation. Dictated by:   Everardo Beals Juanda Chance, M.D. [facility] Attending Physician:  Junious Silk DD:  11/26/01 TD:  11/27/01 Job: [id number]

## 2010-11-17 NOTE — Discharge Summary (Signed)
NAME:  Gregory Wilson, Gregory Wilson NO.:  [id number]   MEDICAL RECORD NO.:  [id number]                   PATIENT TYPE:  INP   LOCATION:  0468                                 FACILITY:  [facility]   PHYSICIAN:  Georges Lynch. Darrelyn Hillock, M.D.             DATE OF BIRTH:  1941/04/27   DATE OF ADMISSION:  07/27/2002  DATE OF DISCHARGE:  08/01/2002                                 DISCHARGE SUMMARY   ADMISSION DIAGNOSES:  1. Osteoarthritis left knee.  2. Hypertension.  3. Cardiovascular disease.  4. Gastroesophageal reflux disease.   DISCHARGE DIAGNOSES:  1. Osteoarthritis left knee status post left total knee arthroplasty.  2. Hypertension.  3. Cardiovascular disease.  4. Gastroesophageal reflux disease.   PROCEDURE:  This patient was taken to the operating room on July 27, 2002  to undergo a left total knee arthroplasty.  Surgeon Georges Lynch. Darrelyn Hillock, M.D.  Assistant Ronnell Guadalajara, M.D. and Ebbie Ridge. Paitsel, P.A.  Hemovac drain was  placed at the time of surgery.  The surgery was performed under general  anesthesia.   CONSULTS:  Jamison Neighbor, M.D.   BRIEF HISTORY:  This patient is a 70 year old male who had been having left  knee pain for the past four to five years.  The patient's symptoms have  progressed to the point that conservative treatment with nonsteroidal anti-  inflammatory pain medications no longer relieve his pain.  His pain has  become more severe over the past several months.  He is having difficulty  ambulating and the patient has been aware that he needs a knee replacement  for a few years.  The patient has elected to proceed with a left total knee  arthroplasty.  The patient was admitted to [facility] for same.   LABORATORY DATA:  Pre admission CBC:  WBC 5.3, RBC 5.48, hemoglobin 15.5,  hematocrit 45.8, platelet count 207,000.  Pre admission routine chemistry  completely normal.  Pre admission urinalysis was normal.  Blood type B+.  Negative antibody screen.  The patient's urinalysis was repeated on July 30, 2002.  Large amount of blood, small amount of leukocyte esterase.  Pre  admission chest x-ray negative for active disease.  Pre admission x-ray of  left knee revealed marked degenerative changes with the greatest joint space  narrowing noted in the medial compartment, marked degenerative changes noted  in the patellofemoral region.  Postoperative x-ray of left knee revealed  satisfactory placement of left total knee prosthesis.   HOSPITAL COURSE:  The patient was admitted to [facility] and taken  to the operating room.  He underwent the above stated procedure without  complication.  The patient tolerated the procedure well and was allowed to  return to the recovery room and then to the orthopedic floor to continue his  postoperative care.  Hemovac drain was placed at the time of surgery.  The  drain was pulled  on postoperative day one.  The patient was placed on PC  analgesia for pain control, weaned over to p.o. analgesics over the next few  days.  Hemoglobin and hematocrit were followed throughout hospitalization.  The patient did have a decrease in his hemoglobin to 9.6, but stabilized at  10.0 prior to discharge.  The patient did not require blood transfusions.  The patient progressed very well.  Was going to be discharged home on  postoperative day four but he developed urinary retention.  Catheter had to  be replaced.  The following day the catheter was removed once again with  difficulty in the patient unable to void so the catheter was replaced.  A  consult was called with Jamison Neighbor, M.D.  The patient's urinary  retention resolved and the patient was discharged home on postoperative day  number five.   DISPOSITION:  The patient discharged home on August 01, 2002.   DISCHARGE MEDICATIONS:  1. Mepergan Fortis one to two q.4-6h. as needed for pain.  2. Robaxin 500 mg one q.4-6h. as  needed for muscle spasm.  3. Coumadin 10 mg tablets one and a half tablets daily.  4. Flomax 0.4 mg daily after a meal for urinary retention.   DIET:  As tolerated.   ACTIVITY:  Total knee precautions.  Full weightbearing with walker.  [facility] for home therapy and nursing.   WOUND CARE:  The patient is to keep the wound dry and clean.   FOLLOW UP:  The patient scheduled to follow up with Windy Fast A. Gioffre, M.D.  two weeks from the date of surgery.  The patient was discharged home with a  Foley catheter in place.  Schedule a follow-up appointment with Jamison Neighbor, M.D. in two days.   CONDITION ON DISCHARGE:  Improved.     Ebbie Ridge. Paitsel, P.A.                     Ronald A. Darrelyn Hillock, M.D.    Tilden Dome  D:  09/02/2002  T:  09/02/2002  Job:  [id number]

## 2010-11-17 NOTE — Discharge Summary (Signed)
NAME:  EUAL, LINDSTROM                        ACCOUNT NO.:  [id number]   MEDICAL RECORD NO.:  [id number]                   PATIENT TYPE:  INP   LOCATION:  0482                                 FACILITY:  [facility]   PHYSICIAN:  Georges Lynch. Darrelyn Hillock, M.D.             DATE OF BIRTH:  Jun 09, 1941   DATE OF ADMISSION:  09/07/2003  DATE OF DISCHARGE:  09/12/2003                                 DISCHARGE SUMMARY   ADMISSION DIAGNOSES:  1. Degenerative arthritis, left hip.  2. Hypertension.  3. Gastroesophageal reflux disease.  4. Hypercholesterolemia.   DISCHARGE DIAGNOSES:  1. Degenerative arthritis, left hip.  2. Status post left total hip arthroplasty.  3. Hypertension.  4. Gastroesophageal reflux disease.  5. Hypercholesterolemia.   PROCEDURE:  The patient was taken to the operating room on September 07, 2003, to  undergo a left total hip arthroplasty. Surgeon was Windy Fast A. Darrelyn Hillock, M.D.;  assistant was Madlyn Frankel. Charlann Boxer, M.D.; surgery was performed under general  anesthesia.   CONSULTATIONS:  PT, OT.   BRIEF HISTORY:  This patient is a 70 year old male who has had left hip pain  for the past two years. He has had increasing pain over the past two months.  He is having difficulty ambulating. He is now requiring a cane for support.  He has had used Arthrotec in the past, but this is no longer helping his  symptoms. The patient was evaluated in the office and his x-ray revealed  severe degenerative changes of the left hip and the patient elects to  proceed with left total hip arthroplasty.   LABORATORY DATA:  Admission CBC revealed WBC of 5.6, RBC 5.04, hemoglobin  13.9, hematocrit 42.4, platelet count 230,000, PT 13.3, INR 1.0, PTT 31.  Preadmission chemistry revealed sodium of 141, potassium 3.9, chloride 109,  CO2 25, glucose 95, BUN 13, creatinine 1.2, calcium 8.8, and albumin 3.9.  Preadmission urinalysis was normal. Patient's blood type is B positive,  negative antibody screen. I am  unable to locate preadmission chest x-ray or  EKG in the patient's medical record. The patient's preoperative x-ray of his  left hip reveals severe degenerative changes.   HOSPITAL COURSE:  The patient was taken to the operating room and underwent  the above stated procedure without complications. He tolerated the procedure  well and was allowed to return to the recovery room and then to the  postoperative floor to continue his postoperative care.  The patient's  hemoglobin and hematocrit were followed throughout his hospitalization. He  did have postoperative decrease in his hemoglobin of 29.1, but that  stabilized prior to discharge and he did not require blood transfusion. PT  and OT were consulted for gait training ambulation. The patient was able to  ambulate with the aid of a walker greater than 100 feet by postoperative day  five, and the patient was discharged home.   DISCHARGE MEDICATIONS:  1. Trinsicon one tablet daily.  2. Coumadin 7.5 mg daily.  3. Percocet 10/650 one or two q.6h. p.r.n. pain.  4. Robaxin 500 mg one q.6h. p.r.n. muscle spasms. The patient is to resume     his routine medications.   ACTIVITY:  Touchdown weightbearing with walker.   WOUND CARE:  He is to keep the wound dry and clean.   SPECIAL INSTRUCTIONS:  The patient is to observe total hip precautions.  [facility] for home care. Monitoring of PT-INR.   FOLLOWUP:  The patient is scheduled to follow up with Dr. Darrelyn Hillock in two  weeks from the date of surgery.   CONDITION ON DISCHARGE:  Improved.     Ebbie Ridge. Paitsel, P.A.                     Ronald A. Darrelyn Hillock, M.D.    Tilden Dome  D:  10/11/2003  T:  10/11/2003  Job:  [id number]

## 2010-11-17 NOTE — Op Note (Signed)
NAME:  Gregory Wilson, Gregory Wilson              ACCOUNT NO.:  [id number]   MEDICAL RECORD NO.:  [id number]          PATIENT TYPE:  AMB   LOCATION:  DAY                           FACILITY:  [facility]   PHYSICIAN:  R. Roetta Sessions, M.D. DATE OF BIRTH:  1941/04/29   DATE OF PROCEDURE:  04/05/2004  DATE OF DISCHARGE:                                 OPERATIVE REPORT   PROCEDURE:  EGD and Maloney dilation followed by biopsy.   INDICATIONS FOR PROCEDURE:  The patient is a 70 year old gentleman with  chronic gastroesophageal reflux disease and new onset of esophageal  dysphagia.  EGD is now being done.  This approach has been discussed with  the patient at length.  Potential risks, benefits, and alternatives have  been reviewed, questions answered.  He is agreeable.   PROCEDURE NOTE:  O2 saturations, blood pressure, pulse, and respirations  were monitored throughout the entire procedure.   CONSCIOUS SEDATION:  1.  Versed 4 mg IV.  2.  Demerol 100 mg IV in divided doses.  3.  Prophylactic antibiotics, ampicillin 2 g, IV gentamicin 120 mg IV.   INSTRUMENT:  Olympus video chip system.   FINDINGS:  Examination of the tubular esophagus revealed a questionable  subtle Schatzki's ring at the EG junction.  Otherwise, esophageal mucosa  appeared normal.  EG junction easily traversed.   STOMACH:  The gastric cavity was emptied, insufflated well with air.  Thorough examination of the gastric mucosa, including retroflexed view of  the proximal stomach and esophagogastric junction demonstrated only a small  hiatal hernia.  Pylorus patent and easily traversed.  Examination of the  bulb revealed multiple adenomatous-appearing nodules of the bulb (see  photos).  Otherwise, bulb and second portion appeared normal.   THERAPY/DIAGNOSTIC MANEUVERS PERFORMED:  1.  A 58 French Maloney dilator was passed to full insertion.  A look back      revealed no apparent complication related to passage of the dilator.  2.  Biopsies  of the nodules in the bulb were taken for histologic study.   The patient tolerated the procedure well and was reacted in endoscopy.   IMPRESSION:  1.  Questionable subtle Schatzki's ring, status post dilation as described      above.  Otherwise, normal esophagus.  2.  Small hiatal hernia, otherwise normal stomach.  3.  Multiple edematous-appearing bulbar nodules of uncertain significance,      biopsied.  4.  Otherwise, the bulb and second portion appeared normal.   RECOMMENDATIONS:  1.  Continue Nexium 40 mg orally b.i.d.  2.  Follow up on path.  3.  Plan to see him back in 3-4 weeks for a recheck.     Gregory Wilson   RMR/MEDQ  D:  04/05/2004  T:  04/05/2004  Job:  [id number]   cc:   Ramon Dredge L. Juanetta Gosling, M.D.  [address]  [place]  [place] [address]  Fax: [phone]

## 2010-11-17 NOTE — Assessment & Plan Note (Signed)
[facility]                            CARDIOLOGY OFFICE NOTE   NAME:Bjorklund, DAMIEAN LUKES                     MRN:          [id number]  DATE:07/17/2006                            DOB:          Aug 09, 1940    Mr. Schollmeyer returns for followup today. He has a history of coronary  disease. Since I last saw him, he is doing well. There is no significant  dyspnea on exertion, orthopnea or PND. There is occasional mild pedal  edema. He has not had exertional chest pain. He does occasionally feel a  discomfort in his chest when he moves his left upper extremity. He is  exercising routinely.   MEDICATIONS:  1. Arthrotec.  2. Nexium 40 mg p.o. daily.  3. Hydrochlorothiazide 12.5 mg p.o. daily.  4. Avapro 150 mg p.o. daily.  5. Lipitor 80 mg p.o. daily.  6. Toprol 100 mg p.o. daily.  7. Aspirin.   PHYSICAL EXAMINATION:  Shows a blood pressure of 130/92 and his pulse is  61.  NECK: Is supple with no bruits.  CHEST: Is clear.  CARDIOVASCULAR: Regular rate and rhythm.  EXTREMITIES: Show no edema.   His electrocardiogram shows a sinus rhythm at a rate of 61. The axis is  normal. There is a right bundle branch block. There are no ST changes  noted.   DIAGNOSES:  1. Coronary artery disease.  2. History of short runs of atrial flutter on prior stress test.  3. Hypertension.  4. Hyperlipidemia.  5. Gastroesophageal reflux disease.   PLAN:  Mr. Auld is doing well since I last saw him. He occasionally  has vague left chest pain that sounds more musculoskeletal. However, it  has been approximately 2-1/2 years since his previous stress test. We  will proceed with a stress Myoview for risk stratification. I will also  increase his Avapro to 300 mg p.o. daily for better blood pressure  control. When he returns for a stress test, we will check a CMET to  follow his liver and renal function as well as lipids and  adjust as indicated. He will continue with risk  factor modification  including diet and exercise. He does not smoke. I will see him back in  12 months.     Madolyn Frieze [name], MD, [facility]  Electronically Signed    BSC/MedQ  DD: 07/17/2006  DT: 07/17/2006  Job #: [id number]   cc:   Ramon Dredge L. Juanetta Gosling, M.D.

## 2010-11-17 NOTE — H&P (Signed)
NAME:  Gregory Wilson, Gregory Wilson                        ACCOUNT NO.:  [id number]   MEDICAL RECORD NO.:  [id number]                   PATIENT TYPE:  INP   LOCATION:  NA                                   FACILITY:  [facility]   PHYSICIAN:  Georges Lynch. Gioffre, M.D.             DATE OF BIRTH:  [birth year]   DATE OF ADMISSION:  DATE OF DISCHARGE:                                HISTORY & PHYSICAL   CHIEF COMPLAINT:  Left knee pain x4 to 5 years.   HISTORY OF PRESENT ILLNESS:  The patient has had knee pain for about four to  five years.  He has had progression of his symptoms.  He has failed  treatment with non-steroidal anti-inflammatory medications.  His pain has  become more severe over the past several months.  He is having difficulty  ambulating, and has been aware that he was in need for a knee replacement  for the past two years.  The patient has elected to proceed with the total  knee replacement.   ALLERGIES:  CODEINE causes nausea.   MEDICATIONS:  1. Altace 10 mg daily.  2. Hydrochlorothiazide 25 mg 1/2 tablet daily.  3. Toprol XL 100 mg daily.  4. Arthrotek 75 mg b.i.d.  5. Nexium 40 mg daily.  6. Lipitor 40 mg daily.  7. Aspirin one daily.   PRIMARY CARE PHYSICIAN:  1. Cardiologist is Dr. [name].  The patient has had medical surgical     clearance by Dr. [name].  2. Primary care physician is Dr. Kari Baars in [place].   PAST MEDICAL HISTORY:  1. Hypertension.  2. Cardiovascular disease.  3. Gastroesophageal reflux disease.  4. Arthritis.   FAMILY HISTORY:  Significant for mother who had heart disease, hypertension.  Father who had lung cancer.   SOCIAL HISTORY:  The patient is married.  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  Does not  smoke or drink alcohol.  He has two children.  His wife will be his primary  caregiver after surgery.  He lives in a single story dwelling with no  stairs.   REVIEW OF SYMPTOMS:  GENERAL:  Denies weight change, fever, chills, fatigue.  HEENT:  Denies  headache, visual changes, tinnitus, hearing loss, sore  throat, hoarseness.  CARDIOVASCULAR:  Denies chest pain, palpitations,  shortness of breath, orthopnea.  PULMONARY:  Denies dyspnea, wheezing,  cough, sputum production, or hemoptysis.  GASTROINTESTINAL:  Denies  dysphagia, nausea, vomiting, hematemesis, or abdominal pain.  GENITOURINARY:  Denies dysuria, frequency, urgency, incontinence, hematuria.  ENDOCRINE:  Denies polyuria, polydipsia, appetite change, heat or cold intolerance.  MUSCULOSKELETAL:  The patient does have left knee pain mostly with range of  motion.  NEUROLOGIC:  Denies dizziness, vertigo, syncope, seizures.  SKIN:  Denies itching, rashes, masses, or moles.   PHYSICAL EXAMINATION:  VITAL SIGNS:  Temperature 97.1, pulse 58,  respirations 18, blood pressure 124/78 in right arm sitting.  GENERAL:  Well-developed, well-nourished  70 year old black male in no acute  distress.  HEENT:  Pupils equal, round, reactive to light.  Extraocular movements were  intact.  Pharynx clear.  Tympanic membranes intact.  NECK:  Supple without masses, no carotid bruits detected.  CHEST:  Clear to auscultation bilaterally.  No wheezes, rhonchi, or rales  noted.  HEART:  Regular rate and rhythm without murmurs, rubs, or gallops.  ABDOMEN:  Positive bowel sounds, soft, flat, nontender, no organomegaly or  abnormal masses.  EXTREMITIES:  Left knee reveals decreased range of motion, pain with extreme  range of motion, a lot of crepitus with flexion and extension.  SKIN:  Warm and dry.   LABORATORY DATA:  X-ray of his left knee shows complete collapse of the  medial joint space.   IMPRESSION:  Severe degenerative arthritis of the left knee.   PLAN:  The patient is to be admitted to [facility] on 07/[facility], to  undergo a left total knee arthroplasty.      Ebbie Ridge. Paitsel, P.A.                     Ronald A. Darrelyn Hillock, M.D.    ZOX/WRUE  D:  07/23/2002  T:  07/23/2002  Job:   [id number]

## 2010-11-17 NOTE — Discharge Summary (Signed)
[place]  Patient:    Gregory Wilson, Gregory Wilson Visit Number: [id number] MRN: [id number]          Service Type: MED Location: [id number] Attending Physician:  Junious Silk Dictated by:   Brita Romp, P.A.C. Admit Date:  11/24/2001 Discharge Date: 11/27/2001   CC:         Damaris Schooner, M.D.  Madolyn Frieze [name], M.D. [facility]   Discharge Summary  DISCHARGE DIAGNOSES: 1. Coronary artery disease, status post cardiac catheterization with    percutaneous coronary intervention x2. 2. Hypertension. 3. Hyperlipidemia. 4. Arthritis. 5. Gastroesophageal reflux disease.  HISTORY OF PRESENT ILLNESS:  [name] is a 70 year old male with a past history of right bundle branch block.  He had been seen in the office the prior week for an episode of chest pain and was scheduled for an outpatient Cardiolite.  However, he had recurrent symptoms and presented to [facility] emergency room.  He was seen and admitted by Madolyn Frieze. [name], M.D. [facility]  He felt that while the patient had a mixed picture of both typical and atypical symptoms of coronary artery disease, that it was best to plan for cardiac catheterization.  HOSPITAL COURSE:  On May 27, the patient was taken to the catheterization lab by Rollene Rotunda, M.D. [facility]  Catheterization results:  1) Left main coronary artery; normal.  2) Left anterior descending; 70% lesion in the midvessel after the first diagonal, diagonal large ostial 80%.  3) Circumflex normal. 4) Right coronary artery; large and normal.  5) Left ventriculogram; ejection fraction 65%, no wall motion abnormalities.  Rollene Rotunda, M.D. [facility] planned to review the films with his colleagues and likely get an intervention on the lesion to the LAD as well as circumflex later that day.  The following day, the patient was taken back to the catheterization lab by Dr. Juanda Chance.  The lesion in the left anterior descending was  reduced down to less than 10% residual and covered with a Cypher stent.  The lesion in the diagonal was reduced down to 7% and also covered with a stent.  Dr. Juanda Chance recommended continued treatment with Plavix for approximately six months.  The next day, the patient was doing well and had no chest pain or shortness of breath.  His groin was stable and there was no hematoma or bruit.  Dr. [name] felt he was then stable for discharge.  DISCHARGE MEDICATIONS: 1. Enteric-coated aspirin 325 mg q.d. 2. Nexium 40 mg q.d. 3. Toprol XL 100 mg q.d. 4. Plavix 75 mg q.d. 5. Altace 5 mg q.d. 6. Hydrochlorothiazide 12.5 mg q.d. 7. Zocor 40 mg q.p.m. 8. Arthrotec as previously taken. 9. The patient was instructed to discontinue taking Procardia as well as    Lopressor HCT.  LABORATORY DATA:  Total cholesterol 210, triglycerides 90, HDL 54, LDL 138, total cholesterol to HDL ratio 3.9.  White count 5.2, hemoglobin 14.4, hematocrit 41.8, platelets 192, sodium 136, potassium 3.3 (repleted), chloride 102, CO2 28, BUN 7, creatinine 1.2, glucose 97.  AST 27, ALT 24, alkaline phosphatase 72, total bilirubin 0.5, magnesium 1.9.  Chest x-ray showed a tortuous thoracic aorta, but no acute disease. Electrocardiogram shows normal sinus rhythm at 69 with right bundle branch block.  PR 198, QRS 168, QTC 460, axis 32.  There are T wave inversions in the lateral leads.  DISCHARGE INSTRUCTIONS:  The patient is to avoid driving, heavy lifting, or tub baths for two days.  He  is to follow a low fat, low cholesterol diet.  He is to watch the catheterization site for any pain, bleeding, or swelling, and call the [facility] for any of these problems.  FOLLOW-UP:  He is to follow up with a P.A. visit in the office on June 12, at 10:30 a.m.  He is to follow up with Madolyn Frieze. [name], M.D. [facility] on July 17, at 9:15 a.m. Dictated by:   Brita Romp, P.A.C. Attending Physician:  Junious Silk DD:   11/27/01 TD:  11/28/01 Job: [id number]

## 2010-11-17 NOTE — Op Note (Signed)
NAME:  CHRISTOPHE, RISING NO.:  [id number]   MEDICAL RECORD NO.:  [id number]                   PATIENT TYPE:  INP   LOCATION:  X006                                 FACILITY:  [facility]   PHYSICIAN:  Georges Lynch. Darrelyn Hillock, M.D.             DATE OF BIRTH:  1940-08-25   DATE OF PROCEDURE:  09/07/2003  DATE OF DISCHARGE:                                 OPERATIVE REPORT   SURGEON:  Georges Lynch. Darrelyn Hillock, M.D.   ASSISTANT:  Madlyn Frankel. Charlann Boxer, M.D.   PREOPERATIVE DIAGNOSIS:  Severe degenerative arthritis, left hip.   POSTOPERATIVE DIAGNOSIS:  Severe degenerative arthritis, left hip.   OPERATION/PROCEDURE:  Left total hip arthroplasty utilizing the [employer]ics  ceramic total hip, porous-coated.   COMPONENTS:  The sizes used was a 54 mm Trident cup.  The insert was a 32 mm  ceramic 0-degree insert.  The femoral component was a size 9 Secur-Fit stem  and the acetabular shell was a affixed to the acetabulum with one screw.  We  also utilized a +0 32 mm diameter ceramic ball.   DESCRIPTION OF PROCEDURE:  Under general anesthesia, with the patient on the  right side, left side up, she first had 1 g of IV Ancef.  Routine orthopedic  prep and draping of the left hip was carried out.  Incision was made over  the lateral aspect of the left hip, bleeders identified and cauterized.  At  this time self-retaining retractors were inserted and the incision was  carried down through greater trochanteric region.  The iliotibial band was  released.  I then dissected a gluteal muscle by blunt dissection.  Great  care was taken to protect the sciatic nerve at all times.  At this time I  detached the external rotators.  I then incised the capsule, dislocated the  hip and amputated the femoral head at the appropriate neck length.  Following that, we then reamed and rasped the femoral canal up to a size 9  Secur-Fit stem.  The distal tip was reamed to a 15.5 mm diameter.  After the  femoral side  was completed, then we went on and reamed the acetabulum up to  54 mm cup.  I then utilized the bur to bur down the inner wall of the  acetabulum so that we could have a better fit for our cup insert.  Thoroughly irrigated out the area.  We inserted our permanent cup and then  our trial insert.  Went through a range of motion and had excellent  stability and excellent motion.  We then affixed the permanent PSL cup with  one cancellous screw.  Following this, we then inserted our permanent  ceramic insert, a 0-degree, 32 mm diameter insert.  We then inserted our  femoral component and had a nice fixation of the porous-coated Secur-Fit,  size 9 femoral component.  After this was done, we then inserted our +0  ceramic head and then reduced the hip, took the hip through motion and again  we had excellent stability and excellent leg lengths.  Great care was taken  during the procedure.  We tried to give him appropriate neck length.  We  thoroughly irrigated out the area.  We then closed the wound with in layered  usual fashion.  Sterile dressings were applied.                                               Ronald A. Darrelyn Hillock, M.D.    RAG/MEDQ  D:  09/07/2003  T:  09/07/2003  Job:  [id number]

## 2010-11-17 NOTE — H&P (Signed)
[place]  Patient:    Gregory Wilson, HAFF Visit Number: [id number] MRN: [id number]          Service Type: MED Location: 2000 2032 01 Attending Physician:  Junious Silk Dictated by:   Madolyn Frieze. [name], M.D. [facility] Admit Date:  11/24/2001                           History and Physical  HISTORY OF PRESENT ILLNESS:  Gregory Wilson is a 70 year old male with a past medical history of right bundle branch block, hypertension, gastroesophageal reflux disease, and status post cholecystectomy, who presents for chest pain. The patient was seen by myself in the [place] office in 1999 secondary to an abnormal electrocardiogram.  He had a stress Cardiolite at that time by report that was normal, although I do not have those records available.  Over the past six months, he describes occasional chest pain.  These symptoms typically occur early in the morning.  He describes it as a sharp sensation and it radiates to the neck.  There is associated nausea, but there is no diaphoresis or shortness of breath.  The pain is not pleuritic or positional and not related to food.  Of note, he does not have exertional symptoms.  He was seen in the office last week for an episode and a Cardiolite was scheduled. However, he had recurrent symptoms this morning and presented to the emergency room.  He is presently pain-free.  Of note, he does not have dyspnea on exertion, orthopnea, PND, pedal edema, palpitations, or syncope.  PAST MEDICAL HISTORY:  Significant for hypertension, but he denies any diabetes mellitus or hyperlipidemia.  He does have a history of arthritis.  He is status post cholecystectomy, as well as appendectomy.  He has a history of gastroesophageal reflux disease and takes Nexium.  PRESENT MEDICATIONS:  Procardia, Lopressor, Nexium, Arthrotec, and aspirin, doses unknown.  ALLERGIES:  He is allergic to CODEINE.  SOCIAL HISTORY:  He does not smoke  nor does he consume alcohol.  FAMILY HISTORY:  Negative for coronary artery disease (he did have a father who had myocardial infarction at age 2).  REVIEW OF SYSTEMS:  There are no headaches, fevers, or chills.  There is no productive cough or hemoptysis.  There is no dysphagia, odynophagia, melena, or hematochezia.  There is no dysuria or hematuria.  There are no rashes or seizure activity.  There is no orthopnea, PND, or pedal edema.  The remaining systems are negative.  PHYSICAL EXAMINATION:  Blood pressure 142/82, pulse 57.  He is afebrile.  GENERAL APPEARANCE:  He is well developed, well nourished, and in no acute distress.  SKIN:  Warm and dry.  HEENT:  Unremarkable with normal eyelids.  NECK:  Supple with normal upstrokes bilaterally.  There are no bruits noted. There is no jugular venous distention and no thyromegaly noted.  CHEST:  Clear to auscultation.  Normal expansion.  CARDIOVASCULAR:  Regular rate and rhythm with a normal S1 and S2.  There are no murmurs, rubs, or gallops noted.  ABDOMEN:  Nontender and nondistended.  Positive bowel sounds.  No hepatosplenomegaly.  No masses appreciated.  There is no abdominal bruit.  The patient does have a prior incision from his cholecystectomy.  EXTREMITIES:  He has 2+ femoral pulses bilaterally with no bruits.  No edema. No can palpate no cords.  He has 2+ dorsalis pedis pulses bilaterally.  NEUROLOGIC:  Grossly  intact.  LABORATORY DATA:  His electrocardiogram shows a sinus bradycardia at a rate of 51.  There are no significant ST changes.  His chest x-ray shows no acute disease, although it does have a tortuous aorta.  His white blood cell count is 5.9 with a hemoglobin of 14.3, a hematocrit of 42.6, and a platelet count of 206.  His sodium is 137 with a potassium of 3.5, chloride 106, and CO2 25.  His BUN and creatinine are 16 and 1.3, respectively.  His liver functions are normal.  His initial CK is 419 with an MB  of 7.2.  His troponin I is normal at 0.03.  DIAGNOSES: 1. Chest pain. 2. Hypertension. 3. History of gastroesophageal reflux disease. 4. Arthritis.  PLAN:  [name] presents for evaluation of chest pain.  The etiology is uncertain.  He has some typical and atypical features for coronary artery disease.  We will admit and continue to cycle enzymes.  I do not feel that his CK-MB at present is diagnostic as the ratio is small and his troponin is negative.  We will treat with aspirin and continue with his beta blockade, as well as heparin.  He is also being treated with IV nitroglycerin.  The risks and benefits of cardiac catheterization have been explained to Mr. Ernster. He agrees to proceed.  We will plan on proceeding on Nov 25, 2001.  We will continue with his Nexium for the possibility of GI etiology.  Of note, the patient does not know the doses of his medications and we will ask his wife to bring those in for review. Dictated by:   Madolyn Frieze. [name], M.D. [facility] Attending Physician:  Junious Silk DD:  11/24/01 TD:  11/24/01 Job: [id number]

## 2010-11-17 NOTE — Op Note (Signed)
[facility]  Patient:    Gregory Wilson, Gregory Wilson Visit Number: [id number] MRN: [id number]          Service Type: END Location: DAY Attending Physician:  Jonathon Bellows Dictated by:   Roetta Sessions, M.D. Proc. Date: 04/28/01 Admit Date:  04/28/2001   CC:         Damaris Schooner, M.D.                           Operative Report  PROCEDURE:  Esophagogastroduodenoscopy with biopsy followed by screening colonoscopy.  ENDOSCOPIST:  Roetta Sessions, M.D.  INDICATIONS FOR PROCEDURE:  Patient is a a 70 year old gentleman with vague upper abdominal discomfort, belching, flatulence, chronically constipated. Upper GI series recently demonstrated a polypoid lesion of the duodenal bulb. EGD is now being done to further evaluate the abnormality on upper GI series; also, colonoscopy is now being done as a screening maneuver.  This approach has been discussed with [name] previously.  Potential risks, benefits and alternatives have been reviewed and questions answered; he is agreeable. Please see the documentation in the medical record for more information.  PROCEDURE NOTE:  O2 saturation, blood pressure, pulse and respirations were monitored throughout the entirety of both procedures.  CONSCIOUS SEDATION FOR BOTH PROCEDURES:  Versed 4 mg IV, Demerol 75 mg IV in divided doses.  INSTRUMENTS:  Olympus video chip gastroscope and colonoscope.  ESOPHAGOGASTRODUODENOSCOPY FINDINGS:  Examination of the tubular esophagus revealed no mucosal abnormalities.  EG junction was easily traversed.  Stomach:  The gastric cavity was empty and insufflated well with air. Thorough examination of the gastric mucosa including a retroflexed view of the proximal stomach and esophagogastric junction demonstrated no abnormalities.  Pyloric channel:  Patent and easily traversed.  Duodenum:  Examination of the bulb revealed over a dozen sessile polyps, the smallest being 1 to 2 mm, the  largest being approximately 5 cm; please see photos.  These appeared to be benign lesions.  The duodenum at this level was widely patent.  Mucosa otherwise appeared normal.  Second portion appeared normal.  Therapeutic/diagnostic maneuvers performed:  These polyps were biopsied for histologic study.  The patient tolerated the procedure well and was prepared for colonoscopy.  Digital rectal exam revealed no abnormalities.  ENDOSCOPIC FINDINGS:  Prep was good.  Rectum:  Examination of the rectal mucosa including a retroflexed view of the anal verge revealed no abnormalities.  Colon:  Colonic mucosa was surveyed from the rectosigmoid junction through the low transverse, right colon, to the area of the appendiceal orifice, ileocecal valve and cecum.  These structures were well-seen and photographed.  No colonic mucosal abnormalities were noted upon the advancing the scope to the cecum.  From the level of the cecum and ileocecal valve, the scope was slowly withdrawn and all previously mentioned mucosal surfaces were again seen and no abnormalities were observed.  The patient tolerated both procedures well and was reacted at endoscopy.  IMPRESSION: 1. Esophagogastroduodenoscopy:  Multiple duodenal bulbar polyps of doubtful    clinical significance, biopsied.  The remainder of upper gastrointestinal    tract appeared normal. 2. Colonoscopy findings:  Normal rectum, normal colon.  RECOMMENDATIONS: 1. Will follow up on pathology, draw H. pylori serologies today. 2. Further recommendations to follow. Dictated by:   Roetta Sessions, M.D. Attending Physician:  Jonathon Bellows DD:  04/28/01 TD:  04/29/01 Job: [id number]

## 2010-11-17 NOTE — H&P (Signed)
NAME:  Gregory Wilson, Gregory Wilson NO.:  [id number]   MEDICAL RECORD NO.:  [id number]           PATIENT TYPE:   LOCATION:                                FACILITY:  [facility]   PHYSICIAN:  R. Roetta Sessions, M.D. DATE OF BIRTH:  1941/02/28   DATE OF ADMISSION:  DATE OF DISCHARGE:  LH                                HISTORY & PHYSICAL   REASON FOR CONSULTATION:  EGD with possible esophageal dilatation,  dysphagia, refractory GERD.   HISTORY OF PRESENT ILLNESS:  Gregory Wilson is a 70 year old African-Americanan  male who was last seen in our office December 2002 by Gregory Wilson.  He has had  a history of GERD and underwent EGD prior to that in October, which was  normal except for over a dozen sessile polyps anywhere from 1 mm to 5 cm.  Polyp was biopsied, which was negative.  He also underwent screening  colonoscopy at that time, which was normal.  He was found to be H. pylori  positive and is status post triple-drug therapy.  He has been on Nexium 40  mg daily since EGD and done quite well except for over the last few months,  he has noted left upper quadrant pressure, which radiates around to the left  midback and left shoulder blade.  He states it feels like gas pains.  He  saw his primary care physician.  He has undergone negative chest x-ray as  well as a negative stress test from his cardiologist, Gregory Wilson.  Pressure typically lasts a few minutes.  He notes pain changes with  movement.  He denies any November.  He also notes dysphagia over the last  month.  He notes he is having to chew his food into finer pieces to prevent  choking sensation.  He also notes frequent throat-clearing.  He underwent  esophagogram on February 09, 2004.  There was mild diffuse impairment of the  esophageal motility and an otherwise normal exam.  He also had CT of the  chest and neck on February 01, 2004, which revealed a small hiatal hernia,  evidence for previous granulomatous disease with a calcified  granuloma seen  within the right lower lung lobe.  Otherwise normal.   He does report history of constipation, uses enemas approximately once a  month to facilitate defecation.  Typically has bowel movements twice a week.  Denies any rectal bleeding, melena, or history of diarrhea.   PAST MEDICAL HISTORY:  1.  Hypertension.  2.  H. pylori positive, status post triple-drug therapy.  3.  EGD as described in HPI in 2002.  4.  Hypercholesterolemia.  5.  Benign prostatic hypertrophy.  6.  Small hiatal hernia.  7.  Arthritis and degenerative joint disease.  8.  Coronary artery disease, status post stent placement in May 2003.  9.  Left hip replacement in 2005.  10. Left knee replacement in 2004.   CURRENT MEDICATIONS:  1.  Arthrotec 75 mg b.i.d.  2.  Nexium 40 mg daily.  3.  ECASA 500 mg daily.  4.  Avapro 150 mg daily.  5.  Toprol XL 100 mg daily.  6.  HCTZ 25 mg one-half tablet daily.  7.  Endocet 10/650 mg daily.  8.  Lipitor 40 mg daily.  9.  Flomax 0.4 mg daily.  10. Avodart unknown dose daily.   ALLERGIES:  CODEINE.   FAMILY HISTORY:  No known family history of colorectal carcinoma, no liver  or chronic GI problems.  Mother deceased at age 39 secondary to  hypertension, coronary artery disease, and renal failure.  Father deceased  at age 78 with history significant for lung carcinoma, although he was a  smoker.  He has two live sisters, one with history of breast carcinoma and  one with history of hypertension.   SOCIAL HISTORY:  Gregory Wilson has been married for 40 years.  He history two  grown, healthy children.  He has been retired since June.  He denies any  tobacco or alcohol or drug use.   REVIEW OF SYSTEMS:  CONSTITUTIONAL:  Weight is up 10 pounds in the last  three months since retirement.  Appetite has been good.  He is complaining  of some early satiety.  He denies any fever or chills.  CARDIOVASCULAR:  Denies any chest pain or palpitations.  He is being  followed by  cardiologist, Gregory Wilson in [place], with recent negative cardiac  workup.  PULMONARY:  Denies any shortness of breath, dyspnea, cough, or  hemoptysis.  GASTROINTESTINAL:  See HPI.   PHYSICAL EXAMINATION:  VITAL SIGNS:  Weight 307.5 pounds, height 74 inches.  Temperature 98.3, blood pressure 120/80, pulse 60.  GENERAL:  Gregory Wilson is an obese 70 year old African-American male who is  alert and oriented and pleasant, cooperative, in no acute distress.  HEENT:  Sclerae are clear, anicteric.  Conjunctivae pink.  Oropharynx pink  and moist without any lesions.  NECK:  Supple without any mass or thyromegaly.  CARDIAC:  Regular rate and rhythm without any murmurs, thrills, rubs, or  gallops.  CHEST:  Lungs clear to auscultation bilaterally.  ABDOMEN:  Protuberant.  He does have a well-healed midline scar from  previous cholecystectomy.  Positive bowel sounds x4.  No bruits auscultated.  Soft, nontender, nondistended, without palpable mass or hepatosplenomegaly.  No rebound tenderness or guarding.  EXTREMITIES:  Good pedal pulses bilaterally, no edema.  SKIN:  Brown, warm and dry, without rash or jaundice, except for annular  hyperpigmented area to right forehead.   ASSESSMENT:  Gregory Wilson is a 70 year old African-American male with  history of chronic gastroesophageal reflux disease, who now presents with  new-onset solid food dysphagia.  He is also having abdominal bloating and  gas as well as some left upper quadrant pressure which radiates through to  his back, most likely falls in the realm of atypical dyspepsia.  He also  notes history significant for constipation as well.  As far as his dysphagia  is concerned, further workup is definitely warranted to rule out development  of esophageal web, ring, or stricture, which could be manipulated.  His left upper quadrant tenderness may be related to refractory gastroesophageal  reflux disease symptoms.  He has been on  Nexium for awhile, and this may  need to be changed.  It is interesting that his pain decreases with movement  and only lasts a few minutes.  Would wonder whether this pain is related to  musculoskeletal origin as well.   RECOMMENDATIONS:  1.  He is to increase his Nexium 40 mg to b.i.d.  We have given him #14  samples.  2.  Will schedule EGD with possible esophageal dilatation by Gregory Wilson in      the near future.  I have discussed this procedure including risks and      benefits, which include but are not limited to bleeding, infection,      perforation, and drug reaction.  He agrees with this plan and consent      will be obtained.  He is asked to hold his aspirin three days prior to      the procedure.  3.  He can used Gas-X over-the-counter p.r.n.  4.  Recommended Colace stool softener one to two capsules daily for      constipation.  5.  Further recommendations pending procedure.   We would like to thank Dr. Juanetta Gosling for allowing [place] to participate in the  care of Mr. Bougie.      ________________________________________  Nicholas Lose, N.P.  ___________________________________________  R. Roetta Sessions, M.D.    KC/MEDQ  D:  03/27/2004  T:  03/27/2004  Job:  [id number]   cc:   Ramon Dredge L. Juanetta Gosling, M.D.  [address]  [place]  [place] [address]  Fax: [phone]

## 2010-11-17 NOTE — Op Note (Signed)
NAME:  BOOKERT, GUZZI NO.:  [id number]   MEDICAL RECORD NO.:  [id number]                   PATIENT TYPE:  INP   LOCATION:  X002                                 FACILITY:  [facility]   PHYSICIAN:  Georges Lynch. Gioffre, M.D.             DATE OF BIRTH:  Feb 20, 1941   DATE OF PROCEDURE:  07/27/2002  DATE OF DISCHARGE:                                 OPERATIVE REPORT   PREOPERATIVE DIAGNOSIS:  Severe degenerative arthritis of the left knee.   POSTOPERATIVE DIAGNOSIS:  Severe degenerative arthritis of the left knee.   SURGEON:  Georges Lynch. Darrelyn Hillock, M.D.   ASSISTANT:  Ronnell Guadalajara, M.D. and Ebbie Ridge. Paitsel, P.A.   OPERATION:  Left total knee arthroplasty.  The size used was a size 26  patella, size 9 left posterior cruciate sacrificing femoral component, size  11 tibial tray with a 12-mm thickness flex insert.  I did use vancomycin in  the cement.   PROCEDURE:  Under general anesthesia, routine orthopedic prep and draping of  the left lower extremity was carried out.  The leg was exsanguinated with an  Esmarch technique and was elevated at 375 mmHg.  The incision was made over  the anterior aspect of the left knee, bleeders identified and cauterized.  Two flaps were created and sutured in place.  I then carried out a knee and  parapatellar incision reflected bilaterally.  We removed a large spur from  the patella.  We also removed all the spurs from the femur and tibia.  We  removed the anterior and posterior cruciate ligaments and the mediolateral  menisci and we did a thorough capsulectomy as well.  We did also did a  synovectomy.  The capsule was extremely tight posteriorly.  We released that  as well.  Following this, we introduced our drill into the intercondylar  notch and made our initial drill hole.  We inserted a guide rod and then  removed 10-mm thickness off the distal femur.  Following this, a 2  Jake__________ size 9 left femur was inserted and we  carried out our  anterior, posterior, and chamfer cuts in the usual fashion.  We then cut our  tibia, removed 6-mm thickness off the affected medial side of the tibia.  We  utilized intramedullary guide rod to do this.  Following that, we then went  on and cut our patellar groove cut and our intercondylar notch cut from the  femur and went through trials at this point.  We selected a size 9 left  femoral component and a size 11 femoral tray with a 12-mm thickness flex  insert.  We had good stability, good function.  We then cut our patella,  removed 10-mm thickness off the articular surface of the patella, and three  drill holes were made in the articular surface of the patella.  We cut the  patella for a size 26 patella.  Following this, we then removed the trial  components and then cut our keel cut in the proximal tibia.  We thoroughly  waterpicked out the knee, dried the knee out, and entered all three  components in simultaneously.  Once the cement was hardened we removed all  loose pieces of cement.  We then waterpicked the knee out again and inserted  our 12-mm thickness tibial flex tibial insert and then went through range of  motion of the knee.  We released the technique prior to inserting our  permanent tibial insert.  We then cauterized all bleeders that were visible.  We thoroughly irrigated out the knee again and then inserted our 12-mm  thickness tibial insert.  The knee was taken through motion and had excellent stability.  The patella  tracked well.  We inserted a Hemovac drain, closed the knee in layers in the  usual fashion.  Sterile dressings were applied.  The patient had 1 gram of  IV Ancef preop.  Left the operating room in satisfactory condition.                                               Ronald A. Darrelyn Hillock, M.D.    RAG/MEDQ  D:  07/27/2002  T:  07/27/2002  Job:  [id number]   cc:   Ramon Dredge L. Juanetta Gosling, M.D.  [address]  [place]  [place] [address]  Fax:  [phone]   Coletta Memos, M.D.  [address]  [place]  [place] [address]  Fax: [phone]

## 2010-11-17 NOTE — Cardiovascular Report (Signed)
[place]  Patient:    Gregory Wilson, Gregory Wilson Visit Number: [id number] MRN: [id number]          Service Type: MED Location: [id number] Attending Physician:  Junious Silk Dictated by:   Rollene Rotunda, M.D. [facility] Proc. Date: 11/25/01 Admit Date:  11/24/2001   CC:         Damaris Schooner, M.D.   Cardiac Catheterization  PROCEDURE:  Left heart catheterization/coronary arteriography.  CARDIOLOGIST:  Rollene Rotunda, M.D. [facility]  INDICATION:  Evaluate patient with chest pain suggestive of unstable angina (411.1)  PROCEDURAL NOTE:  Left heart catheterization was performed via the right femoral artery.  The artery was cannulated using anterior wall puncture.  WEe chose this access because the patient has difficulty with his right knee and thought he might have to move his leg.  A #6 French arterial sheath was inserted via the modified Seldinger technique.  Preformed Judkins and a pigtail catheter were utilized.  The patient tolerated the procedure well and left the lab in stable condition.  Of note, he had a very anterior takeoff of the right coronary artery, and this was cannulated using a left Amplatz 1 catheter.  RESULTS:  HEMODYNAMICS:  LV: 117/20, AO: 117/69.  CORONARIES:  The left main was normal.  Left anterior descending artery:  The LAD had mid 70% stenosis after a large first diagonal.  This first diagonal had ostial 80% stenosis.  Circumflex:  The circumflex was normal.  Right coronary artery:  The right coronary artery was large and normal.  LEFT VENTRICULOGRAM:  A left ventriculogram was obtained in the RAO projection.  The EF was 65% with normal wall motion.  CONCLUSION:  High-grade single-vessel coronary artery disease.  PLAN:  I will review the films with Dr. Juanda Chance in the a.m.  I would suggest percutaneous revascularization of both the ostial diagonal and LAD lesions if he agrees.  The patient will continue with  aggressive secondary risk factor modification. Dictated by:   Rollene Rotunda, M.D. [facility] Attending Physician:  Junious Silk DD:  11/25/01 TD:  11/26/01 Job: [id number]

## 2010-11-17 NOTE — H&P (Signed)
NAME:  Gregory Wilson, Gregory Wilson                        ACCOUNT NO.:  [id number]   MEDICAL RECORD NO.:  [id number]                   PATIENT TYPE:  INP   LOCATION:  NA                                   FACILITY:  [facility]   PHYSICIAN:  Georges Lynch. Gioffre, M.D.             DATE OF BIRTH:  05/30/1941   DATE OF ADMISSION:  09/07/2003  DATE OF DISCHARGE:                                HISTORY & PHYSICAL   HISTORY:  The patient has had left hip pain for the past 2 years.  The pain  has increased over the past 2 months.  He is having difficulty ambulating.  He is requiring a cane for support.  The Arthrotec is no longer helping his  symptoms.  The patient was evaluated in the office, and his x-ray revealed  severe degenerative changes of his left hip, and the patient elects to  proceed with a left total hip arthroplasty for pain control.   ALLERGIES:  CODEINE.   PRIMARY CARE Levis Nazir:  Dr. Kari Baars in [place].   PAST MEDICAL HISTORY:  1. Hypertension.  2. Gastroesophageal reflux disease.  3. Hypercholesterolemia.  4. Degenerative arthritis.   CURRENT MEDICATIONS:  1. Lipitor 40 mg daily.  2. Avapro 150 mg daily.  3. Arthrotec 75 mg b.i.d.  4. Nexium 40 mg daily.  5. Toprol-XL 100 mg daily.  6. Aspirin 325 mg daily.   PREVIOUS SURGICAL HISTORY:  The patient had a left total knee arthroplasty  in January 2004.   FAMILY HISTORY:  Mother died of lung cancer.   REVIEW OF SYSTEMS:  GENERAL:  Denies weight change, fever, chills, fatigue.  HEENT:  Denies headache, visual changes, tinnitus, hearing loss, sore  throat.  CARDIOVASCULAR:  Denies chest pain, palpitations, shortness of  breath, orthopnea.  PULMONARY:  Denies dyspnea, wheezing, cough, sputum  production, hemoptysis.  GENITOURINARY:  Denies dysuria, frequency, urgency,  hematuria.  ENDOCRINE:  Denies polyuria, polydipsia, appetite change, heat  or cold intolerance.  MUSCULOSKELETAL:  The patient has pain in his left  hip.   NEUROLOGIC:  Denies dizziness, vertigo, syncope, seizures.  SKIN:  Denies itching, rashes, masses, or moles.   PHYSICAL EXAM:  VITAL SIGNS:  Temperature 98.4, pulse 78, respirations 15,  blood pressure 120/80 right arm sitting.  GENERAL:  This is a 70 year old male in no acute distress.  HEENT:  PERRL.  EOMs intact.  Pharynx clear.  TMs intact.  NECK:  Supple.  Without masses.  CHEST:  Clear to auscultation bilaterally.  No wheezing, rales, or rhonchi  noted.  HEART:  Regular rate and rhythm.  Without murmur, gallop, or rub.  ABDOMEN:  Positive bowel sounds.  Soft and nontender.  No organomegaly or  abnormal masses.  EXTREMITIES:  Examination of the his left hip reveals he has an antalgic  gait, decreased range of motion, painful range of motion.  SKIN:  Warm and dry.   X-ray of his left  hip reveals degenerative arthritis, positive crescent  sign.   IMPRESSION:  Degenerative arthritis left hip.   PLAN:  The patient is to be admitted to [facility] on September 07, 2003, to undergo a left total hip arthroplasty.     Ebbie Ridge. Paitsel, P.A.                     Ronald A. Darrelyn Hillock, M.D.    Tilden Dome  D:  09/03/2003  T:  09/03/2003  Job:  [id number]

## 2010-12-25 ENCOUNTER — Telehealth: Payer: Self-pay

## 2010-12-25 NOTE — Telephone Encounter (Signed)
Patient called want samples of nexium left 20 at front desk

## 2011-01-19 ENCOUNTER — Telehealth: Payer: Self-pay

## 2011-01-19 NOTE — Telephone Encounter (Signed)
Patient called wanting samples on Nexium 40 mg. Left 4 boxes up front for him.

## 2011-02-26 NOTE — Progress Notes (Signed)
Patient called wanting samples of nexium. Put some up front for him.

## 2011-03-06 ENCOUNTER — Telehealth: Payer: Self-pay | Admitting: Cardiology

## 2011-03-06 MED ORDER — ATORVASTATIN CALCIUM 80 MG PO TABS: 80.0000 mg | ORAL_TABLET | Freq: Every day | ORAL | Status: DC

## 2011-03-06 NOTE — Telephone Encounter (Signed)
Pt was wondering if he can take generic lipitor

## 2011-03-06 NOTE — Telephone Encounter (Signed)
Spoke with pt, okay given for pt to take generic. Script sent to [facility]

## 2011-04-03 ENCOUNTER — Telehealth: Payer: Self-pay | Admitting: Cardiology

## 2011-04-03 NOTE — Telephone Encounter (Signed)
Generic lipitor refill, pt out , fax to [phone] right source asap

## 2011-04-04 ENCOUNTER — Other Ambulatory Visit: Payer: Self-pay | Admitting: *Deleted

## 2011-04-04 MED ORDER — ATORVASTATIN CALCIUM 80 MG PO TABS: 80.0000 mg | ORAL_TABLET | Freq: Every day | ORAL | Status: DC

## 2011-04-12 ENCOUNTER — Other Ambulatory Visit: Payer: Self-pay | Admitting: *Deleted

## 2011-04-12 MED ORDER — ATORVASTATIN CALCIUM 80 MG PO TABS: 80.0000 mg | ORAL_TABLET | Freq: Every day | ORAL | Status: DC

## 2011-04-16 ENCOUNTER — Encounter: Payer: Self-pay | Admitting: Physician Assistant

## 2011-04-16 ENCOUNTER — Ambulatory Visit (INDEPENDENT_AMBULATORY_CARE_PROVIDER_SITE_OTHER): Payer: Medicare PPO | Admitting: Physician Assistant

## 2011-04-16 DIAGNOSIS — R5381 Other malaise: Secondary | ICD-10-CM

## 2011-04-16 DIAGNOSIS — R002 Palpitations: Secondary | ICD-10-CM

## 2011-04-16 DIAGNOSIS — I4892 Unspecified atrial flutter: Secondary | ICD-10-CM

## 2011-04-16 DIAGNOSIS — R5383 Other fatigue: Secondary | ICD-10-CM

## 2011-04-16 DIAGNOSIS — I10 Essential (primary) hypertension: Secondary | ICD-10-CM

## 2011-04-16 DIAGNOSIS — I2510 Atherosclerotic heart disease of native coronary artery without angina pectoris: Secondary | ICD-10-CM

## 2011-04-16 DIAGNOSIS — E785 Hyperlipidemia, unspecified: Secondary | ICD-10-CM

## 2011-04-16 LAB — CREATININE, SERUM
Creatinine, Ser: 1.21
GFR calc Af Amer: >60

## 2011-04-16 MED ORDER — METOPROLOL SUCCINATE ER 50 MG PO TB24: 50.0000 mg | ORAL_TABLET | Freq: Every day | ORAL | Status: DC

## 2011-04-16 NOTE — Assessment & Plan Note (Signed)
Patient has history of drug-eluting stents to the LAD and diagonal in 2003. His last stress Myoview was in April 2010 and was normal. He now complains of lack of energy decreased exercise tolerance and palpitations. We will order a stress Myoview to rule out ischemic component.

## 2011-04-16 NOTE — Assessment & Plan Note (Signed)
Patient has history of brief episode of atrial flutter while on the treadmill. He has had palpitations most recently last evening while working on the computer. They lasted only 5 minutes and resolves. He is in normal sinus rhythm. We will reassess with a stress Myoview.

## 2011-04-16 NOTE — Patient Instructions (Addendum)
Your physician recommends that you schedule a follow-up appointment in: 1 month with Dr [name] Your physician has requested that you have en exercise stress myoview. For further information please visit [email or web address] Please follow instruction sheet, as given. Your physician has recommended you make the following change in your medication: START Metoprolol Succ 50 mg daily Please follow a 2 gram sodium diet   2 Gram Low Sodium Diet A 2 gram sodium diet restricts the amount of sodium in the diet to no more than 2 grams (g) or 2000 milligrams (mg) daily. Limiting the amount of sodium is often used to help lower blood pressure. It is important if you have heart, liver, or kidney problems. Many foods contain sodium for flavor and sometimes as a preservative. When the amount of sodium in a diet needs to be low, it is important to know what to look for when choosing foods and drinks. The following includes some information and guidelines to help make it easier for you to adapt to a low sodium diet. QUICK TIPS  Do not add salt to food.   Avoid convenience items and fast food.   Choose unsalted snack foods.   Buy lower sodium products, often labeled as "lower sodium" or "no salt added."   Check food labels to learn how much sodium is in 1 serving.   When eating at a restaurant, ask that your food be prepared with less salt or none, if possible.  READING FOOD LABELS FOR SODIUM INFORMATION The nutrition facts label is a good place to find how much sodium is in foods. Look for products with no more than 500 to 600 mg of sodium per meal and no more than 150 mg per serving. Remember that 2 g = 2000 mg. The food label may also list foods as:  Sodium-free: Less than 5 mg in a serving.   Very low sodium: 35 mg or less in a serving.   Low-sodium: 140 mg or less in a serving.   Light in sodium: 50% less sodium in a serving. For example, if a food that usually has 300 mg of sodium is changed to  become light in sodium, it will have 150 mg of sodium.   Reduced sodium: 25% less sodium in a serving. For example, if a food that usually has 400 mg of sodium is changed to reduced sodium, it will have 300 mg of sodium.  CHOOSING FOODS AVOID CHOOSE  Grains: Salted crackers and snack items. Some cereals, including instant hot cereals. Bread stuffing and biscuit mixes. Seasoned rice or pasta mixes. Grains: Unsalted snack items. Low-sodium cereals, oats, puffed wheat and rice, shredded wheat. English muffins and bread. Pasta.  Meats: Salted, canned, smoked, spiced, or pickled meats, including fish and poultry. Bacon, ham, sausage, cold cuts, hot dogs, anchovies. Meats: Low-sodium canned tuna and salmon. Fresh or frozen meat, poultry, and fish.  Dairy: Processed cheese and spreads. Cottage cheese. Buttermilk and condensed milk. Regular cheese. Dairy: Milk. Low-sodium cottage cheese. Yogurt. Sour cream. Low-sodium cheese.  Fruits and Vegetables: Regular canned vegetables. Regular canned tomato sauce and paste. Frozen vegetables in sauces. Olives. Rosita Fire. Relishes. Sauerkraut. Fruits and Vegetables: Low-sodium canned vegetables. Low-sodium tomato sauce and paste. Fresh and frozen vegetables. Fresh and frozen fruit.  Condiments: Canned and packaged gravies. Worcestershire sauce. Tartar sauce. Barbecue sauce. Soy sauce. Steak sauce. Ketchup. Onion, garlic, and table salt. Meat flavorings and tenderizers. Condiments: Fresh and dried herbs and spices. Low-sodium varieties of mustard and ketchup. Lemon juice. Tabasco sauce.  Horseradish.  SAMPLE 2 GRAM SODIUM MEAL PLAN Meal Foods Sodium (mg)  Breakfast 1 cup low-fat milk 2 slices whole-wheat toast 1 tablespoon heart-healthy margarine 1 hard-boiled egg 1 small orange [phone]  Lunch 1 cup raw carrots  cup hummus 1 cup low-fat milk  cup red grapes 1 whole-wheat pita bread 76 [phone]    Dinner 1 cup whole-wheat pasta 1 cup low-sodium tomato sauce 3 ounces lean ground beef 1 small side salad (1 cup raw spinach leaves,  cup cucumber,  cup yellow bell pepper) with 1 teaspoon olive oil and 1 teaspoon red wine vinegar 2 73 57 25  Snack 1 container low-fat vanilla yogurt 3 graham cracker squares 107 127  Nutrient Analysis Calories: 2033 Protein: 77 g Carbohydrate: 282 g Fat: 72 g Sodium: 1971 mg Document Released: 06/18/2005 Document Re-Released: 12/06/2009 [facility] Patient Information 2011 [place], [place].

## 2011-04-16 NOTE — Progress Notes (Signed)
HPI: This is a 70 year old male patient with history of coronary artery disease status post Cypher stent to the LAD and Express stent to the diagonal and 2003. His last Myoview in April 2010 showed an ejection fraction of 65% with normal perfusion. He was last seen by Dr. [name] in March 2012 and was doing well.  Patient comes in today complaining of his heart racing for about 5 minutes last night. He's had this in the past but this is the first time in a while that it happened. He does have a history of atrial flutter that was brief while on the treadmill. The patient also can planes of lack of energy for the past 2-3 months. He stopped exercising last January. He also feels a sensation of something squirting in his left chest. He says it may be coming from his stomach but he's not sure. He denies any chest tightness, pressure, dyspnea, dyspnea on exertion, neck pain, dizziness, or presyncope.  Patient also admits to eating a lot of salt. He said he put salt on everything he eats.  Allergies  Allergen Reactions  . Codeine     Current Outpatient Prescriptions on File Prior to Visit  Medication Sig Dispense Refill  . aspirin 325 MG tablet Take 325 mg by mouth daily.        Marland Kitchen atorvastatin (LIPITOR) 80 MG tablet Take 1 tablet (80 mg total) by mouth daily.  90 tablet  4  . esomeprazole (NEXIUM) 40 MG capsule Take 40 mg by mouth daily before breakfast.        . hydrochlorothiazide 25 MG tablet Take 25 mg by mouth. Take 1/2 tablet every day       . irbesartan (AVAPRO) 300 MG tablet Take 1 tablet (300 mg total) by mouth daily.  30 tablet  12  . meclizine (ANTIVERT) 50 MG tablet Take 50 mg by mouth. 1/2 tablet (25mg ) as needed       . meloxicam (MOBIC) 7.5 MG tablet Take 7.5 mg by mouth daily.        . Tamsulosin HCl (FLOMAX) 0.4 MG CAPS Take by mouth daily.        . traMADol (ULTRAM-ER) 100 MG 24 hr tablet Take 100 mg by mouth daily as needed.          Past Medical History  Diagnosis Date  .  Arthritis   . Hiatal hernia   . History of benign prostatic hypertrophy   . Hypercholesterolemia   . Dysphagia   . GERD (gastroesophageal reflux disease)   . Hypertension   . Atrial flutter     BRIEF/ON TREADMILL  . CAD (coronary artery disease)   . Degenerative joint disease     Past Surgical History  Procedure Date  . Hip surgery 2005    left hip  . Replacement total knee 2004    Left  . Cholecystectomy   . Total shoulder replacement     bilateral  . Upper gastrointestinal endoscopy     amd Maloney dilation followed by biopsy  . Status post triple drug therapy     Family History  Problem Relation Age of Onset  . Hypertension Mother   . Coronary artery disease Mother   . Kidney failure Mother   . Cancer Father     Lung  . Cancer Sister     breast carcinoma  . Hypertension Sister     History   Social History  . Marital Status: Married    Spouse Name: N/A  Number of Children: N/A  . Years of Education: N/A   Occupational History  . Not on file.   Social History Main Topics  . Smoking status: Never Smoker   . Smokeless tobacco: Not on file  . Alcohol Use: No  . Drug Use: No  . Sexually Active: Not on file   Other Topics Concern  . Not on file   Social History Narrative   Retired from [employer]    ROS: See HPI Eyes: Negative Ears:Negative for hearing loss, tinnitus Cardiovascular: Negative for dyspnea, dyspnea on exertion, near-syncope, orthopnea, paroxysmal nocturnal dyspnia and syncope,edema, claudication, cyanosis,.  Respiratory:   Negative for cough, hemoptysis, shortness of breath, sleep disturbances due to breathing, sputum production and wheezing.   Endocrine: Negative for cold intolerance and heat intolerance.  Hematologic/Lymphatic: Negative for adenopathy and bleeding problem. Does not bruise/bleed easily.  Musculoskeletal: Negative.   Gastrointestinal: Negative for nausea, vomiting, reflux, abdominal pain, diarrhea,  constipation.    Neurological: patient complains of left facial numbness and headaches when he wakes up in the morning but it resolves quickly Allergic/Immunologic: Negative for environmental allergies.   PHYSICAL EXAM:Obese, in no acute distress. Neck: No JVD, HJR, Bruit, or thyroid enlargement Lungs: No tachypnea, clear without wheezing, rales, or rhonchi Cardiovascular: RRR, PMI not displaced, heart sounds normal, positive S4, no bruit, thrill, or heave. Abdomen: BS normal. Soft without organomegaly, masses, lesions or tenderness. Extremities: without cyanosis, clubbing or edema. Good distal pulses bilateral SKin: Warm, no lesions or rashes  Musculoskeletal: No deformities Neuro: no focal signs  Pulse 76  Ht 6\' 1"  (1.854 m)  Wt 324 lb (146.965 kg)  BMI 42.75 kg/m2  BJY:NWGNFA sinus rhythm with right bundle branch block, no acute change

## 2011-04-16 NOTE — Assessment & Plan Note (Signed)
Patient was scheduled to have fasting lipid panel today her primary care's office. He will reschedule.

## 2011-04-16 NOTE — Assessment & Plan Note (Signed)
Patient's blood pressure is elevated today. His metoprolol XL 100 mg was stopped last office visit. We will resume this at 50 mg daily. I will also give him a 2 g sodium diet to follow.

## 2011-04-19 ENCOUNTER — Encounter: Payer: Self-pay | Admitting: *Deleted

## 2011-04-30 ENCOUNTER — Ambulatory Visit (HOSPITAL_COMMUNITY): Payer: Medicare PPO | Attending: Cardiology | Admitting: Radiology

## 2011-04-30 DIAGNOSIS — I4510 Unspecified right bundle-branch block: Secondary | ICD-10-CM

## 2011-04-30 DIAGNOSIS — R5381 Other malaise: Secondary | ICD-10-CM | POA: Insufficient documentation

## 2011-04-30 DIAGNOSIS — R002 Palpitations: Secondary | ICD-10-CM | POA: Insufficient documentation

## 2011-04-30 DIAGNOSIS — R9431 Abnormal electrocardiogram [ECG] [EKG]: Secondary | ICD-10-CM

## 2011-04-30 MED ORDER — REGADENOSON 0.4 MG/5ML IV SOLN
0.4000 mg | Freq: Once | INTRAVENOUS | Status: AC
  Administered 2011-04-30: 0.4 mg via INTRAVENOUS

## 2011-04-30 MED ORDER — TECHNETIUM TC 99M TETROFOSMIN IV KIT
33.0000 | PACK | Freq: Once | INTRAVENOUS | Status: AC | PRN
  Administered 2011-04-30: 33 via INTRAVENOUS

## 2011-04-30 NOTE — Progress Notes (Signed)
[facility] SITE 3 NUCLEAR MED [address] [place] [address] [phone]  Cardiology Nuclear Med Study  Gregory Wilson is a 70 y.o. male [id number] Dec 28, 1940   Nuclear Med Background Indication for Stress Test:  Evaluation for Ischemia, Stent Patency and PTCA Patency History: '03 Angioplasty: LAD, '03 Heart Catheterization: '03 EF 65%, 10/28/08 (-) scar/ischemia Myocardial Perfusion Study and '03 Stents: LAD Cardiac Risk Factors: Family History - CAD, Hypertension, Lipids and RBBB  Symptoms:  Chest Pain, Fatigue, Fatigue with Exertion, Palpitations, Rapid HR and SOB   Nuclear Pre-Procedure Caffeine/Decaff Intake:  None NPO After: 8:00pm   Lungs:  clear IV 0.9% NS with Angio Cath:  20g  IV Site: R Antecubital  IV Started by:  Stanton Kidney, EMT-P  Chest Size (in):  54 Cup Size: n/a  Height: 6\' 1"  (1.854 m)  Weight:  312 lb (141.522 kg)  BMI:  Body mass index is 41.16 kg/(m^2). Tech Comments:  Toprol held this morning, per pt.    Nuclear Med Study 1 or 2 day study: 2 day  Stress Test Type:  Eugenie Birks  Reading MD: Marca Ancona, MD  Order Authorizing Provider:  B.Crenshaw  Resting Radionuclide: Technetium 53m Tetrofosmin  Resting Radionuclide Dose: 33 mCi  05/02/11  Stress Radionuclide:  Technetium 46m Tetrofosmin  Stress Radionuclide Dose: 33 mCi  04/30/11          Stress Protocol Rest HR: 68 Stress HR: 99  Rest BP: 136/82 Stress BP: 151/79  Exercise Time (min): n/a METS: n/a   Predicted Max HR: 151 bpm % Max HR: 65.56 bpm Rate Pressure Product: 21308   Dose of Adenosine (mg):  n/a Dose of Lexiscan: 0.4 mg  Dose of Atropine (mg): n/a Dose of Dobutamine: n/a mcg/kg/min (at max HR)  Stress Test Technologist: Milana Na, EMT-P  Nuclear Technologist:  Doyne Keel, CNMT     Rest Procedure:  Myocardial perfusion imaging was performed at rest 45 minutes following the intravenous administration of Technetium 60m Tetrofosmin. Rest ECG:  NSR-RBBB  Stress Procedure:  The patient received IV Lexiscan 0.4 mg over 15-seconds.  Technetium 77m Tetrofosmin injected at 30-seconds.  There were no significant changes and sob with Lexiscan.  Quantitative spect images were obtained after a 45 minute delay. Stress ECG: No significant change from baseline ECG  QPS Raw Data Images:  Normal; no motion artifact; normal heart/lung ratio. Stress Images:  Mildly decreased uptake over basilar inferolateral wall Rest Images:  Mildly decreased uptake over basilar inferolateral wall Subtraction (SDS):  Mild soft tissue attenaution over inferolateral wall. No ischemia. Transient Ischemic Dilatation (Normal <1.22):  .82 Lung/Heart Ratio (Normal <0.45):  .31   Quantitative Gated Spect Images QGS EDV:  90 ml QGS ESV:  29 ml QGS cine images:  NL LV Function; NL Wall Motion QGS EF: 67%  Impression Exercise Capacity:  Lexiscan with no exercise. BP Response:  n/a Clinical Symptoms:  n/a ECG Impression:  No significant ECG changes with Lexiscan. Comparison with Prior Nuclear Study: No images to compare  Overall Impression:  Normal stress nuclear study.   Daniel Bensimhon

## 2011-05-01 ENCOUNTER — Encounter: Payer: Self-pay | Admitting: Gastroenterology

## 2011-05-01 ENCOUNTER — Ambulatory Visit (INDEPENDENT_AMBULATORY_CARE_PROVIDER_SITE_OTHER): Payer: Medicare PPO | Admitting: Gastroenterology

## 2011-05-01 DIAGNOSIS — R1310 Dysphagia, unspecified: Secondary | ICD-10-CM

## 2011-05-01 DIAGNOSIS — K5900 Constipation, unspecified: Secondary | ICD-10-CM

## 2011-05-01 MED ORDER — ESOMEPRAZOLE MAGNESIUM 40 MG PO CPDR: 40.0000 mg | DELAYED_RELEASE_CAPSULE | Freq: Every day | ORAL | Status: DC

## 2011-05-01 NOTE — Patient Instructions (Signed)
We have set you up for an upper endoscopy and colonoscopy with Dr. Jena Gauss.   Please see the high fiber diet handout. You may take Miralax 1 capful daily as needed.   Continue to take Nexium daily. Samples have been provided.

## 2011-05-01 NOTE — Progress Notes (Signed)
Referring Provider: No ref. provider found Primary Care Physician:  Gregory Maudlin, MD Primary Gastroenterologist: Dr. Jena Gauss   Chief Complaint  Patient presents with  . Follow-up    HPI:   Gregory Wilson is a pleasant 70 year old male who was last seen in March 2012 and scheduled for an endoscopy and colonoscopy. For some reason, there was a miscommunication and it was never completed. He has a history of GERD, constipation, H.pylori s/p treatment, and subtle Schatzki's ring with dilation in 2005. He presents today with intermittent vague dysphagia, specifically with large pills and tough textures. He finds he has to chew his food multiple times. He denies abdominal pain, uncontrolled reflux, nausea, or vomiting. He also has noted intermittent constipation that is chronic; he averages about 2 BMs per week. He is not on a bowel regimen, and he is hesitant to use Miralax. He does not want to add "any more medications". He denies any melena or hematochezia.    Past Medical History  Diagnosis Date  . Arthritis   . Hiatal hernia   . History of benign prostatic hypertrophy   . Hypercholesterolemia   . Dysphagia   . GERD (gastroesophageal reflux disease)   . Hypertension   . Atrial flutter     BRIEF/ON TREADMILL  . CAD (coronary artery disease)   . Degenerative joint disease   . S/P endoscopy Oct 2005    RMR: questionable Schatzki's ring, s/p 66 F dilation, multiple nodules of bulb with path of mild chronic active duodenitis   . Helicobacter pylori ab+ 2002    s/p treatment   . S/P colonoscopy 2002    RMR: normal    Past Surgical History  Procedure Date  . Hip surgery 2005    left hip  . Replacement total knee 2004    Left  . Cholecystectomy   . Total shoulder replacement     bilateral    Current Outpatient Prescriptions  Medication Sig Dispense Refill  . aspirin 325 MG tablet Take 325 mg by mouth daily.        Marland Kitchen atorvastatin (LIPITOR) 80 MG tablet Take 1 tablet (80 mg  total) by mouth daily.  90 tablet  4  . esomeprazole (NEXIUM) 40 MG capsule Take 40 mg by mouth daily before breakfast.        . hydrochlorothiazide 25 MG tablet Take 25 mg by mouth. Take 1/2 tablet every day       . irbesartan (AVAPRO) 300 MG tablet Take 1 tablet (300 mg total) by mouth daily.  30 tablet  12  . meclizine (ANTIVERT) 50 MG tablet Take 50 mg by mouth. 1/2 tablet (25mg ) as needed       . meloxicam (MOBIC) 7.5 MG tablet Take 7.5 mg by mouth 2 (two) times daily.       . metoprolol (TOPROL XL) 50 MG 24 hr tablet Take 1 tablet (50 mg total) by mouth daily.  30 tablet  2  . Tamsulosin HCl (FLOMAX) 0.4 MG CAPS Take by mouth daily.        . traMADol (ULTRAM-ER) 100 MG 24 hr tablet Take 100 mg by mouth daily as needed.          Allergies as of 05/01/2011 - Review Complete 05/01/2011  Allergen Reaction Noted  . Codeine Nausea And Vomiting 10/28/2008    Family History  Problem Relation Age of Onset  . Hypertension Mother   . Coronary artery disease Mother   . Kidney failure Mother   .  Cancer Father     Lung  . Cancer Sister     breast carcinoma  . Hypertension Sister   . Colon cancer Neg Hx     History   Social History  . Marital Status: Married    Spouse Name: N/A    Number of Children: N/A  . Years of Education: N/A   Social History Main Topics  . Smoking status: Never Smoker   . Smokeless tobacco: None  . Alcohol Use: No  . Drug Use: No  . Sexually Active: None   Other Topics Concern  . None   Social History Narrative   Retired from [employer]    Review of Systems: Gen: Denies fever, chills, anorexia. Denies fatigue, weakness, weight loss.  CV: Denies chest pain, syncope, peripheral edema, and claudication. Resp: Denies dyspnea at rest, cough, wheezing, coughing up blood, and pleurisy. GI: SEE HPI Derm: Denies rash, itching, dry skin Psych: Denies depression, anxiety, memory loss, confusion. No homicidal or suicidal ideation.  Heme: Denies  bruising, bleeding, and enlarged lymph nodes.  Physical Exam: BP 140/80  Pulse 62  Temp(Src) 97.6 F (36.4 C) (Temporal)  Ht 6\' 1"  (1.854 m)  Wt 320 lb 9.6 oz (145.423 kg)  BMI 42.30 kg/m2 General:   Alert and oriented. No distress noted. Pleasant and cooperative.  Head:  Normocephalic and atraumatic. Eyes:  Conjuctiva clear without scleral icterus. Mouth:  Oral mucosa pink and moist. Good dentition. No lesions. Neck:  Supple, without mass or thyromegaly. Heart:  S1, S2 present without murmurs, rubs, or gallops. Regular rate and rhythm. Abdomen:  +BS, soft, non-tender and non-distended. No rebound or guarding. No HSM or masses noted. Midline scar well-healed.  Msk:  Symmetrical without gross deformities. Normal posture. Extremities:  Without edema. Neurologic:  Alert and  oriented x4;  grossly normal neurologically. Skin:  Intact without significant lesions or rashes. Cervical Nodes:  No significant cervical adenopathy. Psych:  Alert and cooperative. Normal mood and affect.

## 2011-05-01 NOTE — Assessment & Plan Note (Signed)
70 year old male with hx of subtle Schatzki's ring s/p dilation in 2005 by Dr. Jena Gauss. Now with intermittent vague dysphagia for close to one year. GERD controlled well on Nexium daily. He has tried and failed Prilosec, Prevacid, and Aciphex in the past. He has no other upper GI symptoms. We will proceed with an EGD/ED in near future as well as provide additional samples of Nexium. His insurance company has refused to pay for it thus far. I will send another prescription and hopefully be able to appeal this decision due to the multiple medications he has tried previously.  Proceed with upper endoscopy/dilation in the near future with Dr. Jena Gauss. The risks, benefits, and alternatives have been discussed in detail with patient. They have stated understanding and desire to proceed.  Nexium 40 mg daily, #14 samples provided and rx sent

## 2011-05-01 NOTE — Progress Notes (Signed)
Cc to PCP 

## 2011-05-01 NOTE — Assessment & Plan Note (Signed)
Chronic constipation with need for updated screening colonoscopy, last done in 2002. He is hesitant to add anything such as Miralax to his bowel regimen, as he states he is on "so many meds" already. We will proceed with provision of high fiber diet as well as instructions to take Miralax 1 capful on any given day needed for bowel movement.   Proceed with TCS with Dr. Jena Gauss in near future: the risks, benefits, and alternatives have been discussed with the patient in detail. The patient states understanding and desires to proceed.

## 2011-05-02 ENCOUNTER — Ambulatory Visit (HOSPITAL_COMMUNITY): Payer: Medicare PPO | Attending: Cardiology | Admitting: Radiology

## 2011-05-02 DIAGNOSIS — R0989 Other specified symptoms and signs involving the circulatory and respiratory systems: Secondary | ICD-10-CM

## 2011-05-02 MED ORDER — TECHNETIUM TC 99M TETROFOSMIN IV KIT
33.0000 | PACK | Freq: Once | INTRAVENOUS | Status: AC | PRN
  Administered 2011-05-02: 33 via INTRAVENOUS

## 2011-05-03 ENCOUNTER — Other Ambulatory Visit: Payer: Self-pay | Admitting: Gastroenterology

## 2011-05-03 DIAGNOSIS — Z139 Encounter for screening, unspecified: Secondary | ICD-10-CM

## 2011-05-03 HISTORY — PX: ESOPHAGOGASTRODUODENOSCOPY: SHX1529

## 2011-05-28 MED ORDER — SODIUM CHLORIDE 0.45 % IV SOLN
Freq: Once | INTRAVENOUS | Status: AC
  Administered 2011-05-29: 1000 mL via INTRAVENOUS

## 2011-05-29 ENCOUNTER — Other Ambulatory Visit: Payer: Self-pay | Admitting: Internal Medicine

## 2011-05-29 ENCOUNTER — Encounter (HOSPITAL_COMMUNITY)
Admission: RE | Disposition: A | Discharge status: Home / Self Care | Payer: Self-pay | Source: Ambulatory Visit | Attending: Internal Medicine

## 2011-05-29 ENCOUNTER — Ambulatory Visit (HOSPITAL_COMMUNITY)
Admission: RE | Admit: 2011-05-29 | Discharge: 2011-05-29 | Disposition: A | Discharge status: Home / Self Care | Payer: Medicare PPO | Source: Ambulatory Visit | Attending: Internal Medicine | Admitting: Internal Medicine

## 2011-05-29 ENCOUNTER — Encounter (HOSPITAL_COMMUNITY): Payer: Self-pay | Admitting: *Deleted

## 2011-05-29 DIAGNOSIS — R1310 Dysphagia, unspecified: Secondary | ICD-10-CM | POA: Insufficient documentation

## 2011-05-29 DIAGNOSIS — Z79899 Other long term (current) drug therapy: Secondary | ICD-10-CM | POA: Insufficient documentation

## 2011-05-29 DIAGNOSIS — Z1211 Encounter for screening for malignant neoplasm of colon: Secondary | ICD-10-CM

## 2011-05-29 DIAGNOSIS — Z139 Encounter for screening, unspecified: Secondary | ICD-10-CM

## 2011-05-29 DIAGNOSIS — D126 Benign neoplasm of colon, unspecified: Secondary | ICD-10-CM | POA: Insufficient documentation

## 2011-05-29 DIAGNOSIS — Z7982 Long term (current) use of aspirin: Secondary | ICD-10-CM | POA: Insufficient documentation

## 2011-05-29 DIAGNOSIS — K449 Diaphragmatic hernia without obstruction or gangrene: Secondary | ICD-10-CM | POA: Insufficient documentation

## 2011-05-29 DIAGNOSIS — E785 Hyperlipidemia, unspecified: Secondary | ICD-10-CM | POA: Insufficient documentation

## 2011-05-29 DIAGNOSIS — I10 Essential (primary) hypertension: Secondary | ICD-10-CM | POA: Insufficient documentation

## 2011-05-29 DIAGNOSIS — K219 Gastro-esophageal reflux disease without esophagitis: Secondary | ICD-10-CM

## 2011-05-29 HISTORY — PX: COLONOSCOPY: SHX5424

## 2011-05-29 SURGERY — COLONOSCOPY
Anesthesia: Moderate Sedation

## 2011-05-29 MED ORDER — MIDAZOLAM HCL 5 MG/5ML IJ SOLN
INTRAMUSCULAR | Status: AC
  Filled 2011-05-29: qty 10

## 2011-05-29 MED ORDER — MEPERIDINE HCL 100 MG/ML IJ SOLN
INTRAMUSCULAR | Status: AC
  Filled 2011-05-29: qty 1

## 2011-05-29 MED ORDER — BUTAMBEN-TETRACAINE-BENZOCAINE 2-2-14 % EX AERO
INHALATION_SPRAY | CUTANEOUS | Status: DC | PRN
  Administered 2011-05-29: 2 via TOPICAL

## 2011-05-29 MED ORDER — MIDAZOLAM HCL 5 MG/5ML IJ SOLN
INTRAMUSCULAR | Status: DC | PRN
  Administered 2011-05-29: 2 mg via INTRAVENOUS
  Administered 2011-05-29: 1 mg via INTRAVENOUS
  Administered 2011-05-29: 2 mg via INTRAVENOUS

## 2011-05-29 MED ORDER — STERILE WATER FOR IRRIGATION IR SOLN
Status: DC | PRN
  Administered 2011-05-29: 11:00:00

## 2011-05-29 MED ORDER — MEPERIDINE HCL 100 MG/ML IJ SOLN
INTRAMUSCULAR | Status: DC | PRN
  Administered 2011-05-29: 25 mg via INTRAVENOUS
  Administered 2011-05-29: 50 mg via INTRAVENOUS

## 2011-05-29 NOTE — H&P (Signed)
  I have seen & examined the patient prior to the procedure(s) today and reviewed the history and physical/consultation.  There have been no changes.  After consideration of the risks, benefits, alternatives and imponderables, the patient has consented to the procedure(s).   

## 2011-05-30 ENCOUNTER — Ambulatory Visit: Payer: Medicare PPO | Admitting: Cardiology

## 2011-06-06 ENCOUNTER — Encounter (HOSPITAL_COMMUNITY): Payer: Self-pay | Admitting: Internal Medicine

## 2011-06-07 ENCOUNTER — Encounter: Payer: Self-pay | Admitting: Internal Medicine

## 2011-06-11 ENCOUNTER — Ambulatory Visit: Payer: Medicare PPO | Admitting: Cardiology

## 2011-07-17 ENCOUNTER — Ambulatory Visit: Payer: Medicare PPO | Admitting: Cardiology

## 2011-07-24 ENCOUNTER — Other Ambulatory Visit: Payer: Self-pay | Admitting: *Deleted

## 2011-07-24 ENCOUNTER — Other Ambulatory Visit: Payer: Self-pay | Admitting: Cardiology

## 2011-07-24 MED ORDER — METOPROLOL SUCCINATE ER 50 MG PO TB24: 50.0000 mg | ORAL_TABLET | Freq: Every day | ORAL | Status: DC

## 2011-07-24 NOTE — Telephone Encounter (Signed)
Pt needs refill called into [facility] on [address] in [place] [phone]  Pt is out of meds completely and Pharm has contacted [place] and we have not responded

## 2011-07-30 ENCOUNTER — Ambulatory Visit: Payer: Medicare PPO | Admitting: Cardiology

## 2011-08-06 ENCOUNTER — Telehealth: Payer: Self-pay | Admitting: Cardiology

## 2011-08-06 NOTE — Telephone Encounter (Signed)
Spoke with pt, questions regarding metoprolol dosage answered.

## 2011-08-06 NOTE — Telephone Encounter (Signed)
Pt calling re metoprolol has two different dosages

## 2011-08-24 ENCOUNTER — Telehealth: Payer: Self-pay | Admitting: Cardiology

## 2011-08-24 MED ORDER — METOPROLOL TARTRATE 25 MG PO TABS: 25.0000 mg | ORAL_TABLET | Freq: Two times a day (BID) | ORAL | Status: DC

## 2011-08-24 NOTE — Telephone Encounter (Signed)
Pt rtn call 

## 2011-08-24 NOTE — Telephone Encounter (Signed)
New msg Pt wants to talk to you about metoprolol succinate. He said there has been mix up with insurance co And he cant get any more until April. He only has 1 pill left Please call

## 2011-08-24 NOTE — Telephone Encounter (Signed)
Spoke with pt, he had received metoprolol er in the mail and there was miscommunication with our office and the pt threw the metoprolol away. His insurance will now not approve any med for him until April. Called and spoke with the [facility] and we will change the metoprolol from succ to metoprolol tart 25 mg bid and the insurance will cover. Pt made aware.

## 2011-09-06 ENCOUNTER — Encounter: Payer: Self-pay | Admitting: Cardiology

## 2011-09-06 ENCOUNTER — Ambulatory Visit (INDEPENDENT_AMBULATORY_CARE_PROVIDER_SITE_OTHER): Payer: Medicare PPO | Admitting: Cardiology

## 2011-09-06 VITALS — BP 157/97 | HR 66 | Ht 73.0 in | Wt 330.0 lb

## 2011-09-06 DIAGNOSIS — I2510 Atherosclerotic heart disease of native coronary artery without angina pectoris: Secondary | ICD-10-CM

## 2011-09-06 MED ORDER — HYDROCHLOROTHIAZIDE 12.5 MG PO CAPS: 12.5000 mg | ORAL_CAPSULE | Freq: Every day | ORAL | Status: DC

## 2011-09-06 NOTE — Patient Instructions (Signed)
Your physician wants you to follow-up in: ONE YEAR You will receive a reminder letter in the mail two months in advance. If you don't receive a letter, please call our office to schedule the follow-up appointment.   START HCTZ 12.5 MG ONCE DAILY  Your physician recommends that you return for lab work in: ONE WEEK=[id number]/V58.69

## 2011-09-06 NOTE — Assessment & Plan Note (Signed)
Continue statin. Lipids and liver monitored by primary care. 

## 2011-09-06 NOTE — Assessment & Plan Note (Signed)
Continue aspirin and statin. Recent Myoview negative. 

## 2011-09-06 NOTE — Progress Notes (Signed)
HPI: Pleasant male with past medical history of coronary disease who returns for followup. The patient has had prior PCI of his LAD. Last myoview in Oct 2012 showed an ejection fraction of 67% and normal perfusion. Since I last saw him in March of 2012, the patient denies any dyspnea on exertion, orthopnea, PND, palpitations, syncope or chest pain. Occasional mild pedal edema.   Current Outpatient Prescriptions  Medication Sig Dispense Refill  . aspirin 325 MG tablet Take 325 mg by mouth daily.        Marland Kitchen atorvastatin (LIPITOR) 80 MG tablet Take 1 tablet (80 mg total) by mouth daily.  90 tablet  4  . esomeprazole (NEXIUM) 40 MG capsule Take 1 capsule (40 mg total) by mouth daily before breakfast.  31 capsule  11  . irbesartan (AVAPRO) 300 MG tablet Take 1 tablet (300 mg total) by mouth daily.  30 tablet  12  . meclizine (ANTIVERT) 50 MG tablet Take 50 mg by mouth. 1/2 tablet (25mg ) as needed       . meloxicam (MOBIC) 7.5 MG tablet Take 7.5 mg by mouth 2 (two) times daily.       . metoprolol tartrate (LOPRESSOR) 25 MG tablet Take 1 tablet (25 mg total) by mouth 2 (two) times daily.  60 tablet  11  . Tamsulosin HCl (FLOMAX) 0.4 MG CAPS Take by mouth daily.        . traMADol (ULTRAM-ER) 100 MG 24 hr tablet Take 100 mg by mouth daily as needed.           Past Medical History  Diagnosis Date  . Arthritis   . Hiatal hernia   . History of benign prostatic hypertrophy   . Hypercholesterolemia   . Dysphagia   . GERD (gastroesophageal reflux disease)   . Hypertension   . Atrial flutter     BRIEF/ON TREADMILL  . CAD (coronary artery disease)   . Degenerative joint disease   . S/P endoscopy Oct 2005    RMR: questionable Schatzki's ring, s/p 83 F dilation, multiple nodules of bulb with path of mild chronic active duodenitis   . Helicobacter pylori ab+ 2002    s/p treatment   . S/P colonoscopy 2002    RMR: normal  . Stented coronary artery 2003    2    Past Surgical History  Procedure  Date  . Hip surgery 2005    left hip  . Replacement total knee 2004    Left  . Cholecystectomy   . Colonoscopy 05/29/2011    Procedure: COLONOSCOPY;  Surgeon: Corbin Ade, MD;  Location: [facility];  Service: Endoscopy;  Laterality: N/A;  11:15    History   Social History  . Marital Status: Married    Spouse Name: N/A    Number of Children: N/A  . Years of Education: N/A   Occupational History  . Not on file.   Social History Main Topics  . Smoking status: Never Smoker   . Smokeless tobacco: Not on file  . Alcohol Use: No  . Drug Use: No  . Sexually Active: Not on file   Other Topics Concern  . Not on file   Social History Narrative   Retired from [employer]    ROS: Chronic back and shoulder pain but no fevers or chills, productive cough, hemoptysis, dysphasia, odynophagia, melena, hematochezia, dysuria, hematuria, rash, seizure activity, orthopnea, PND, claudication. Remaining systems are negative.  Physical Exam: Well-developed well-nourished in no acute  distress.  Skin is warm and dry.  HEENT is normal.  Neck is supple. No thyromegaly.  Chest is clear to auscultation with normal expansion.  Cardiovascular exam is regular rate and rhythm.  Abdominal exam nontender or distended. No masses palpated. Extremities show trace edema. neuro grossly intact  ECG sinus rhythm at a rate of 66. Right bundle branch block

## 2011-09-06 NOTE — Assessment & Plan Note (Signed)
Blood pressure elevated. Add HCTZ 12.5 mg daily. Patient will have his potassium and renal function checked early next week with his primary care physician.

## 2011-09-06 NOTE — Assessment & Plan Note (Signed)
Occurred transiently on the treadmill. Previously declined Coumadin.  

## 2011-09-11 ENCOUNTER — Encounter: Payer: Self-pay | Admitting: Cardiology

## 2011-09-23 ENCOUNTER — Other Ambulatory Visit: Payer: Self-pay | Admitting: Cardiology

## 2011-12-27 ENCOUNTER — Other Ambulatory Visit: Payer: Self-pay | Admitting: Cardiology

## 2011-12-27 MED ORDER — ATORVASTATIN CALCIUM 80 MG PO TABS: 80.0000 mg | ORAL_TABLET | Freq: Every day | ORAL | Status: DC

## 2012-03-25 ENCOUNTER — Other Ambulatory Visit: Payer: Self-pay | Admitting: Internal Medicine

## 2012-03-25 NOTE — Telephone Encounter (Signed)
Is asking for samples of Nexium can we give him any please advise

## 2012-08-22 ENCOUNTER — Other Ambulatory Visit: Payer: Self-pay | Admitting: Cardiology

## 2012-08-22 MED ORDER — METOPROLOL TARTRATE 25 MG PO TABS: 25.0000 mg | ORAL_TABLET | Freq: Two times a day (BID) | ORAL | Status: DC

## 2012-08-23 ENCOUNTER — Other Ambulatory Visit: Payer: Self-pay | Admitting: Cardiology

## 2012-08-25 ENCOUNTER — Other Ambulatory Visit: Payer: Self-pay | Admitting: Cardiology

## 2012-08-25 MED ORDER — METOPROLOL TARTRATE 25 MG PO TABS: 25.0000 mg | ORAL_TABLET | Freq: Two times a day (BID) | ORAL | Status: DC

## 2012-09-10 ENCOUNTER — Encounter: Payer: Self-pay | Admitting: Internal Medicine

## 2012-09-11 ENCOUNTER — Encounter: Payer: Self-pay | Admitting: Gastroenterology

## 2012-09-11 ENCOUNTER — Ambulatory Visit (INDEPENDENT_AMBULATORY_CARE_PROVIDER_SITE_OTHER): Payer: Medicare PPO | Admitting: Gastroenterology

## 2012-09-11 VITALS — BP 158/96 | HR 73 | Temp 98.4°F | Ht 73.0 in | Wt 326.8 lb

## 2012-09-11 DIAGNOSIS — R1310 Dysphagia, unspecified: Secondary | ICD-10-CM | POA: Insufficient documentation

## 2012-09-11 MED ORDER — ESOMEPRAZOLE MAGNESIUM 40 MG PO CPDR: 40.0000 mg | DELAYED_RELEASE_CAPSULE | Freq: Every day | ORAL | Status: DC

## 2012-09-11 NOTE — Patient Instructions (Addendum)
We have scheduled an xray of your esophagus to further evaluate your swallowing.  Continue to take Nexium daily.  We will see you back in 2 years!

## 2012-09-11 NOTE — Assessment & Plan Note (Signed)
72 year old male with history of GERD and dysphagia, requiring several dilations in the past and a possible Schatzki's ring noted. Last EGD/ED by Dr. Jena Gauss in Nov 2012 with empiric dilation. He returns with good control of GERD symptoms on Nexium daily. He notes sensation of choking and pill dysphagia. I question an underlying motility disorder. Will obtain a BPE and review. If necessary, may need repeat EGD/ED, as he has done well with this in the past.  BPE Nexium daily TCS in Nov 2016 Return in 2 years otherwise

## 2012-09-11 NOTE — Progress Notes (Signed)
Faxed to PCP

## 2012-09-11 NOTE — Progress Notes (Signed)
Referring Provider: Fredirick Maudlin, MD Primary Care Physician:  Fredirick Maudlin, MD Primary GI: Dr. Jena Gauss  Chief Complaint  Patient presents with  . Medication Refill    HPI:   72 year old male with history of possible Schatzki's ring, GERD, and constipation, now returning for routine follow-up and refills. He will be due for surveillance colonoscopy in Nov 2016. Last EGD in Nov 2012 with empiric dilation performed. He has tried and failed Prilosec, Prevacid, and Aciphex in the past.  Started drinking apple cider daily, which has significantly helped constipation. No rectal bleeding. No abdominal pain. No N/V. States he has to chew his food a little better than before. Sometimes pills get stuck in his throat. Noted improvement after last dilation. Needs refills on Nexium.   Past Medical History  Diagnosis Date  . Arthritis   . Hiatal hernia   . History of benign prostatic hypertrophy   . Hypercholesterolemia   . Dysphagia   . GERD (gastroesophageal reflux disease)   . Hypertension   . Atrial flutter     BRIEF/ON TREADMILL  . CAD (coronary artery disease)   . Degenerative joint disease   . S/P endoscopy Oct 2005    RMR: questionable Schatzki's ring, s/p 76 F dilation, multiple nodules of bulb with path of mild chronic active duodenitis   . Helicobacter pylori ab+ 2002    s/p treatment   . S/P colonoscopy 2002    RMR: normal  . Stented coronary artery 2003    2    Past Surgical History  Procedure Laterality Date  . Hip surgery  2005    left hip  . Replacement total knee  2004    Left  . Cholecystectomy    . Colonoscopy  05/29/2011    RMR: tubular adenoma  . Esophagogastroduodenoscopy  04/05/2004    RMR: Small hiatal hernia/Questionable subtle Schatzki's ring, status post dilation as described  . Esophagogastroduodenoscopy  05/2011    RMR: small hiatal hernia, empiric dilation with 16F and 34F Maloney    Current Outpatient Prescriptions  Medication Sig Dispense  Refill  . aspirin 325 MG tablet Take 325 mg by mouth daily.        Marland Kitchen atorvastatin (LIPITOR) 80 MG tablet Take 1 tablet (80 mg total) by mouth daily.  90 tablet  3  . esomeprazole (NEXIUM) 40 MG capsule Take 1 capsule (40 mg total) by mouth daily before breakfast.  31 capsule  11  . irbesartan (AVAPRO) 300 MG tablet take 1 tablet by mouth once daily  30 tablet  12  . meclizine (ANTIVERT) 50 MG tablet Take 50 mg by mouth. 1/2 tablet (25mg ) as needed       . meloxicam (MOBIC) 7.5 MG tablet Take 7.5 mg by mouth 2 (two) times daily.       . metoprolol tartrate (LOPRESSOR) 25 MG tablet Take 1 tablet (25 mg total) by mouth 2 (two) times daily.  60 tablet  4  . Tamsulosin HCl (FLOMAX) 0.4 MG CAPS Take by mouth daily.        . traMADol (ULTRAM-ER) 100 MG 24 hr tablet Take 100 mg by mouth daily as needed.        . hydrochlorothiazide (MICROZIDE) 12.5 MG capsule Take 1 capsule (12.5 mg total) by mouth daily.  90 capsule  4   No current facility-administered medications for this visit.    Allergies as of 09/11/2012 - Review Complete 09/11/2012  Allergen Reaction Noted  . Codeine Nausea And Vomiting 10/28/2008  Family History  Problem Relation Age of Onset  . Hypertension Mother   . Coronary artery disease Mother   . Kidney failure Mother   . Cancer Father     Lung  . Cancer Sister     breast carcinoma  . Hypertension Sister   . Colon cancer Neg Hx     History   Social History  . Marital Status: Married    Spouse Name: N/A    Number of Children: N/A  . Years of Education: N/A   Social History Main Topics  . Smoking status: Never Smoker   . Smokeless tobacco: None  . Alcohol Use: No  . Drug Use: No  . Sexually Active: None   Other Topics Concern  . None   Social History Narrative   Retired from [employer]    Review of Systems: Negative unless mentioned in HPI  Physical Exam: BP 158/96  Pulse 73  Temp(Src) 98.4 F (36.9 C) (Oral)  Ht 6\' 1"  (1.854 m)  Wt  326 lb 12.8 oz (148.236 kg)  BMI 43.13 kg/m2 General:   Alert and oriented. No distress noted. Pleasant and cooperative.  Head:  Normocephalic and atraumatic. Eyes:  Conjuctiva clear without scleral icterus. Mouth:  Oral mucosa pink and moist. Good dentition. No lesions. Heart:  S1, S2 present without murmurs, rubs, or gallops. Regular rate and rhythm. Abdomen:  +BS, soft, non-tender and non-distended. No rebound or guarding. No HSM or masses noted. Msk:  Symmetrical without gross deformities. Normal posture. Extremities:  Without edema. Neurologic:  Alert and  oriented x4;  grossly normal neurologically. Skin:  Intact without significant lesions or rashes. Psych:  Alert and cooperative. Normal mood and affect.

## 2012-09-15 ENCOUNTER — Other Ambulatory Visit: Payer: Self-pay

## 2012-09-15 MED ORDER — ESOMEPRAZOLE MAGNESIUM 40 MG PO CPDR: 40.0000 mg | DELAYED_RELEASE_CAPSULE | Freq: Every day | ORAL | Status: DC

## 2012-09-17 ENCOUNTER — Ambulatory Visit (HOSPITAL_COMMUNITY)
Admission: RE | Admit: 2012-09-17 | Discharge: 2012-09-17 | Disposition: A | Discharge status: Home / Self Care | Payer: Medicare PPO | Source: Ambulatory Visit | Attending: Gastroenterology | Admitting: Gastroenterology

## 2012-09-17 DIAGNOSIS — K2289 Other specified disease of esophagus: Secondary | ICD-10-CM | POA: Insufficient documentation

## 2012-09-17 DIAGNOSIS — K228 Other specified diseases of esophagus: Secondary | ICD-10-CM | POA: Insufficient documentation

## 2012-09-17 DIAGNOSIS — R1310 Dysphagia, unspecified: Secondary | ICD-10-CM | POA: Insufficient documentation

## 2012-09-17 DIAGNOSIS — K219 Gastro-esophageal reflux disease without esophagitis: Secondary | ICD-10-CM | POA: Insufficient documentation

## 2012-09-23 NOTE — Progress Notes (Signed)
Quick Note:  Significant GERD on BPE Distal esophagus with smooth narrowing but no obstruction to tablet. Mild motility issues, which may be culprit here. May offer EGD/ED with Dr. Jena Gauss in the near future for empiric dilation.  ______

## 2012-09-24 ENCOUNTER — Other Ambulatory Visit: Payer: Self-pay | Admitting: Cardiology

## 2012-09-24 NOTE — Progress Notes (Signed)
Patient stated that now is not a good time to schedule EGD/ED and it will be toward the end of April or first of May hes going to [place] to see his daughter and he will call us back to set up another opv to have this scheduled at this time

## 2012-11-29 IMAGING — CR DG CHEST 1V
2 series · 2 of 2 positions shown · non-contrast
Comparison: 01/27/2007

CLINICAL DATA: Dizziness, palpitations.

CHEST - 1 VIEW

[view not recorded (1 of 2)]
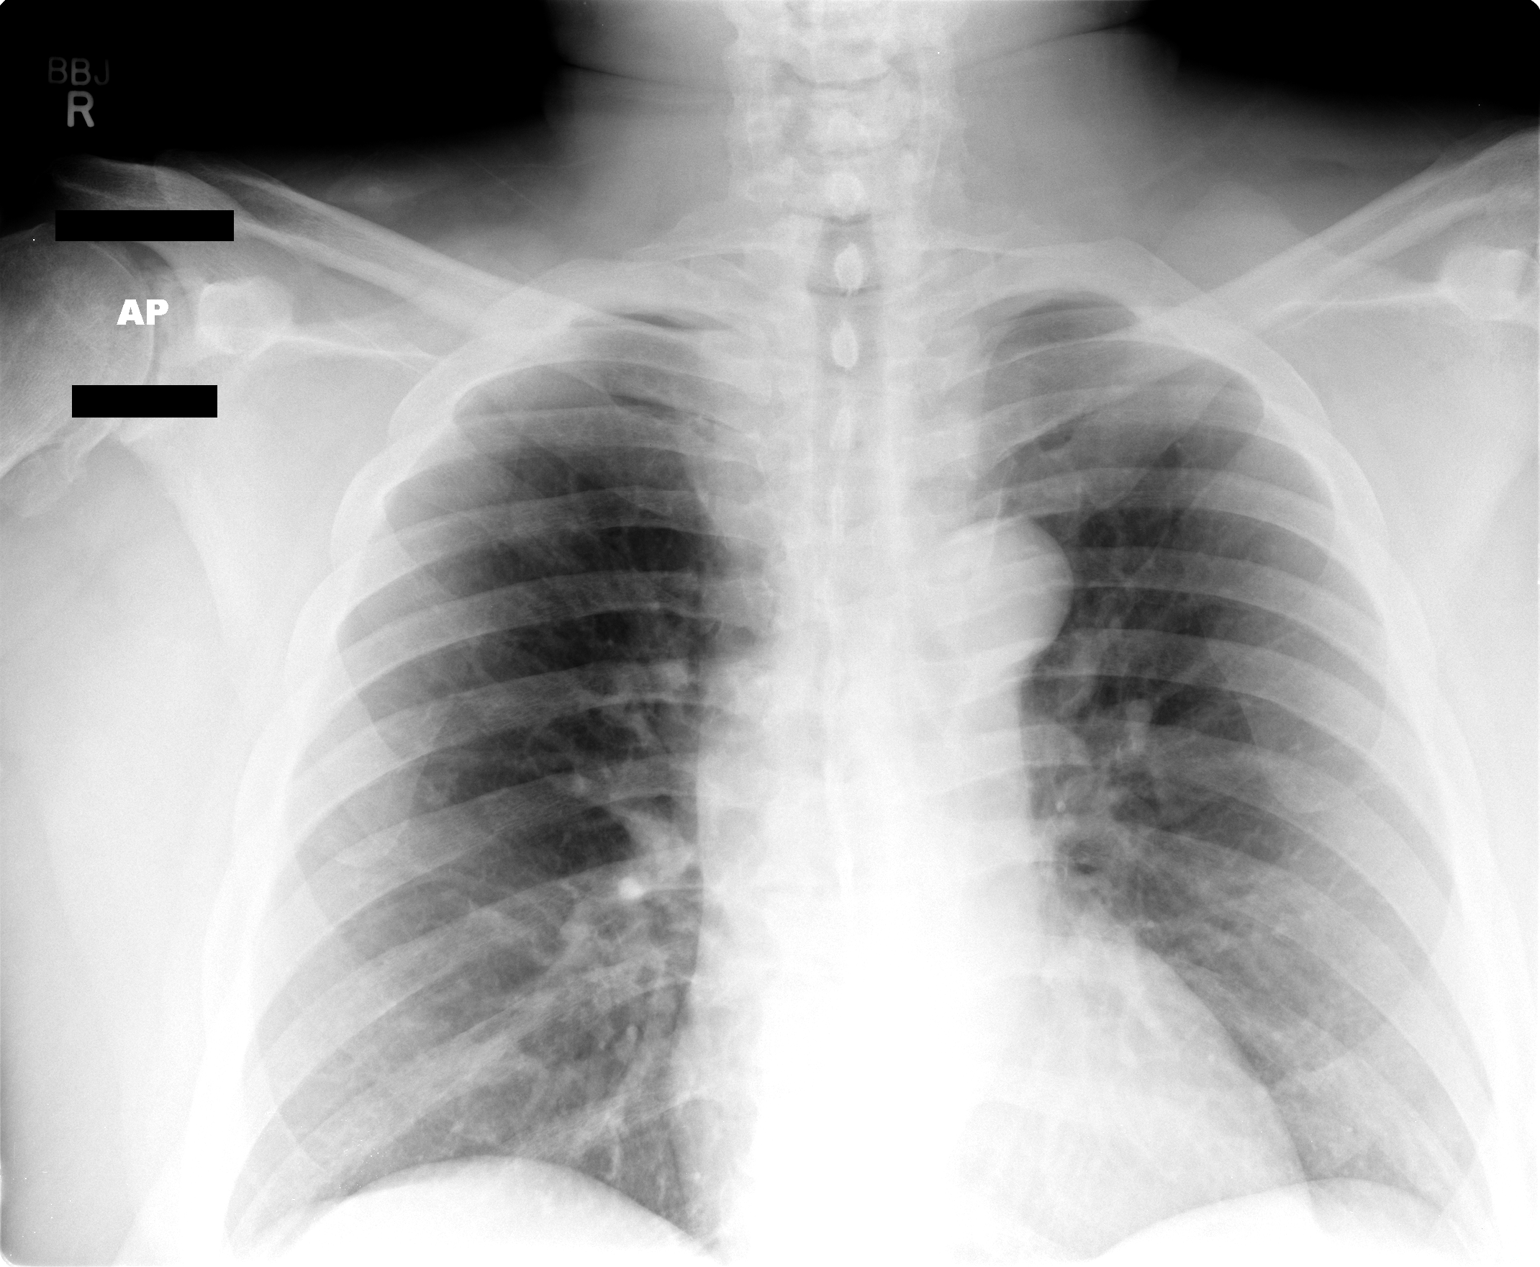

[view not recorded (2 of 2)]
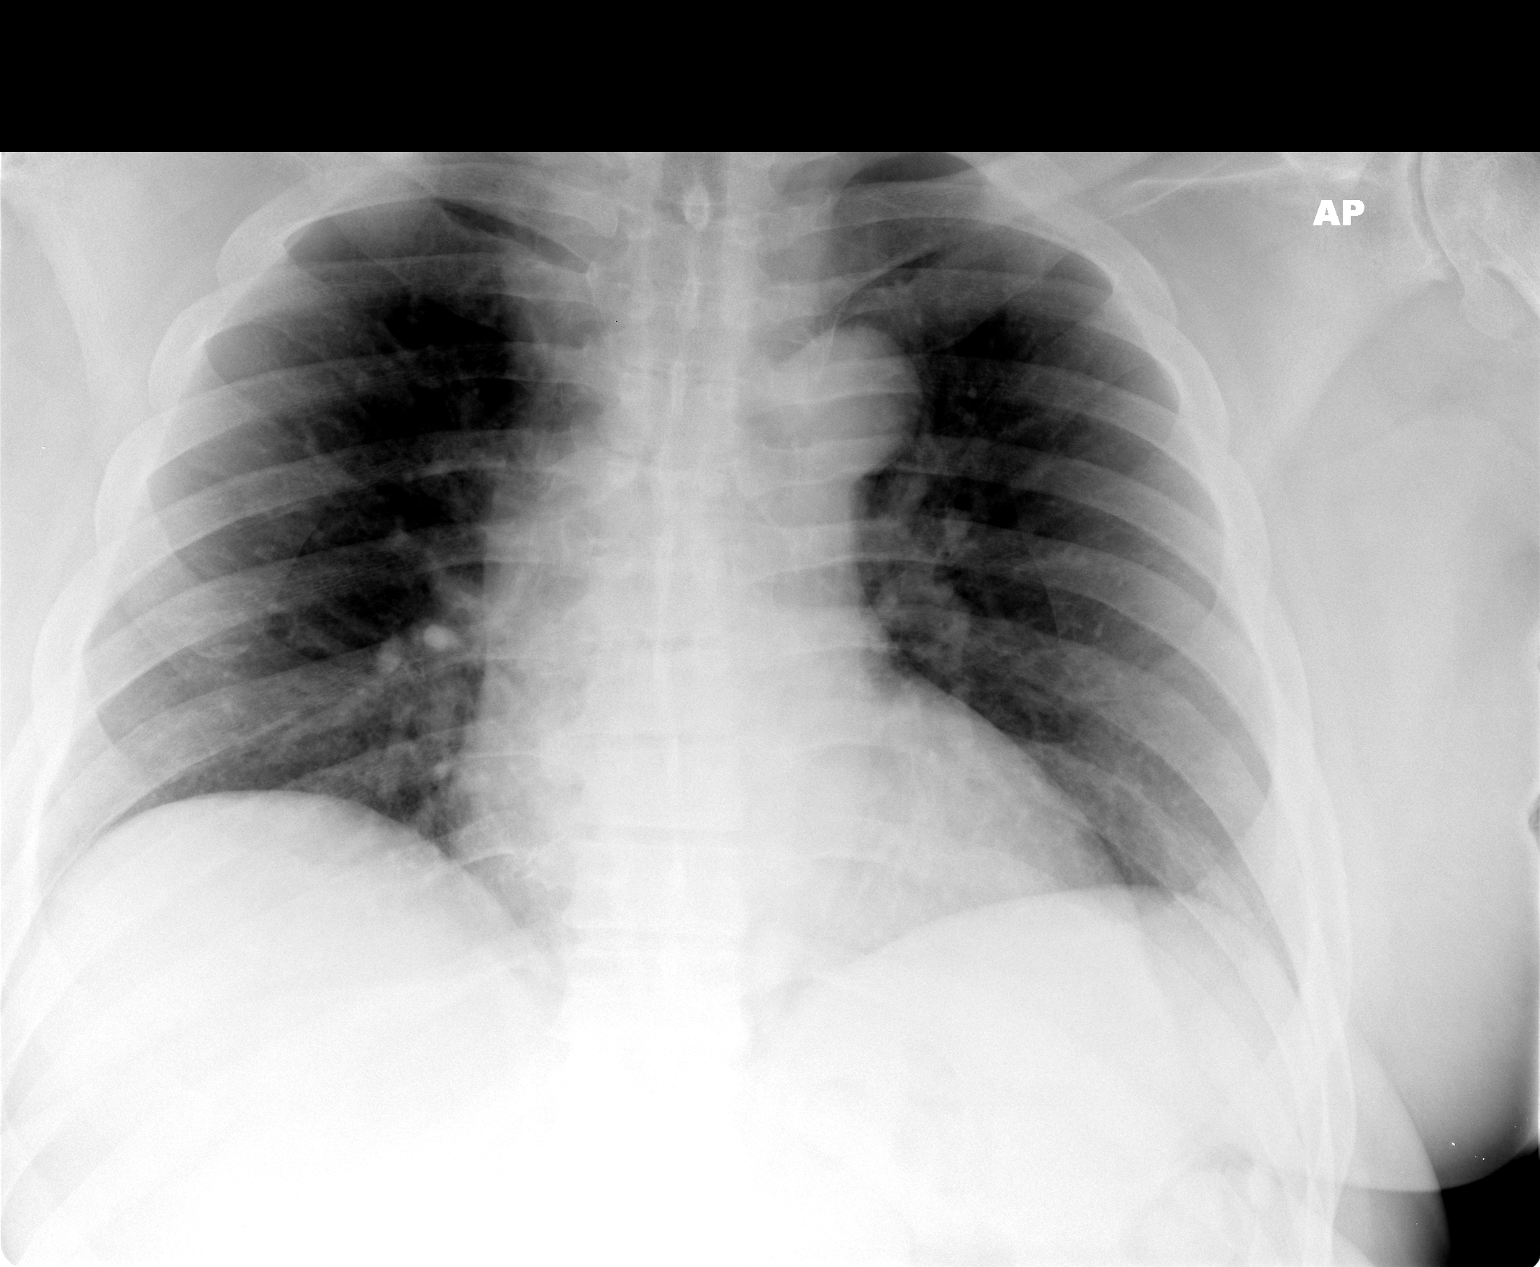

[2 of 2 positions shown; findings below may reference images not displayed]

FINDINGS: The heart size and mediastinal contours are within normal
limits.  Both lungs are clear.  The visualized skeletal structures
are unremarkable.
IMPRESSION: No active cardiopulmonary abnormalities.

## 2012-12-25 ENCOUNTER — Other Ambulatory Visit: Payer: Self-pay | Admitting: Internal Medicine

## 2012-12-25 ENCOUNTER — Encounter: Payer: Self-pay | Admitting: Gastroenterology

## 2012-12-25 ENCOUNTER — Ambulatory Visit (INDEPENDENT_AMBULATORY_CARE_PROVIDER_SITE_OTHER): Payer: Medicare PPO | Admitting: Gastroenterology

## 2012-12-25 VITALS — BP 150/86 | HR 69 | Temp 98.4°F | Ht 73.0 in | Wt 331.6 lb

## 2012-12-25 DIAGNOSIS — R1319 Other dysphagia: Secondary | ICD-10-CM

## 2012-12-25 DIAGNOSIS — R1310 Dysphagia, unspecified: Secondary | ICD-10-CM

## 2012-12-25 NOTE — Progress Notes (Signed)
CC PCP 

## 2012-12-25 NOTE — Progress Notes (Signed)
Referring Provider: Fredirick Maudlin, MD Primary Care Physician:  Fredirick Maudlin, MD Primary GI: Dr. Jena Gauss   Chief Complaint  Patient presents with  . EGD    HPI:   72 year old male with history of GERD and dysphagia, requiring several dilations in the past and a possible Schatzki's ring noted. Last EGD/ED by Dr. Jena Gauss in Nov 2012 with empiric dilation. Recent BPE with significant GERD, distal esophagus with smooth narrowing but no obstruction, mild motility likely. Patient has noted some vague recurrence of dysphagia. Intermittent. Pill dysphagia, has noticed worsening of symptoms. Has to chew food longer. Washes down with liquids. No odynophagia. Has noticed epigastric "stinging" despite taking Nexium daily. Just lingers, not associated with eating/drinking. Feels like an empty spot. Takes Meloxicam 15 mg daily. No melena. Good appetite.    Past Medical History  Diagnosis Date  . Arthritis   . Hiatal hernia   . History of benign prostatic hypertrophy   . Hypercholesterolemia   . Dysphagia   . GERD (gastroesophageal reflux disease)   . Hypertension   . Atrial flutter     BRIEF/ON TREADMILL  . CAD (coronary artery disease)   . Degenerative joint disease   . S/P endoscopy Oct 2005    RMR: questionable Schatzki's ring, s/p 39 F dilation, multiple nodules of bulb with path of mild chronic active duodenitis   . Helicobacter pylori ab+ 2002    s/p treatment   . S/P colonoscopy 2002    RMR: normal  . Stented coronary artery 2003    2    Past Surgical History  Procedure Laterality Date  . Hip surgery  2005    left hip  . Replacement total knee  2004    Left  . Cholecystectomy    . Colonoscopy  05/29/2011    RMR: tubular adenoma  . Esophagogastroduodenoscopy  04/05/2004    RMR: Small hiatal hernia/Questionable subtle Schatzki's ring, status post dilation as described  . Esophagogastroduodenoscopy  05/2011    RMR: small hiatal hernia, empiric dilation with 6F and 75F  Maloney    Current Outpatient Prescriptions  Medication Sig Dispense Refill  . aspirin 325 MG tablet Take 325 mg by mouth daily.        Marland Kitchen atorvastatin (LIPITOR) 80 MG tablet Take 1 tablet (80 mg total) by mouth daily.  90 tablet  3  . esomeprazole (NEXIUM) 40 MG capsule Take 1 capsule (40 mg total) by mouth daily before breakfast.  30 capsule  11  . irbesartan (AVAPRO) 300 MG tablet take 1 tablet by mouth once daily  30 tablet  12  . meclizine (ANTIVERT) 50 MG tablet Take 50 mg by mouth. 1/2 tablet (25mg ) as needed       . meloxicam (MOBIC) 7.5 MG tablet Take 7.5 mg by mouth 2 (two) times daily.       . metoprolol tartrate (LOPRESSOR) 25 MG tablet Take 1 tablet (25 mg total) by mouth 2 (two) times daily.  60 tablet  4  . Tamsulosin HCl (FLOMAX) 0.4 MG CAPS Take by mouth daily.        . traMADol (ULTRAM-ER) 100 MG 24 hr tablet Take 100 mg by mouth daily as needed.        . hydrochlorothiazide (MICROZIDE) 12.5 MG capsule Take 1 capsule (12.5 mg total) by mouth daily.  90 capsule  4   No current facility-administered medications for this visit.    Allergies as of 12/25/2012 - Review Complete 12/25/2012  Allergen Reaction Noted  .  Codeine Nausea And Vomiting 10/28/2008    Family History  Problem Relation Age of Onset  . Hypertension Mother   . Coronary artery disease Mother   . Kidney failure Mother   . Cancer Father     Lung  . Cancer Sister     breast carcinoma  . Hypertension Sister   . Colon cancer Neg Hx     History   Social History  . Marital Status: Married    Spouse Name: N/A    Number of Children: N/A  . Years of Education: N/A   Social History Main Topics  . Smoking status: Never Smoker   . Smokeless tobacco: None  . Alcohol Use: No  . Drug Use: No  . Sexually Active: None   Other Topics Concern  . None   Social History Narrative   Retired from [employer]    Review of Systems: Negative unless mentioned in HPI  Physical Exam: BP 150/86   Pulse 69  Temp(Src) 98.4 F (36.9 C) (Oral)  Ht 6\' 1"  (1.854 m)  Wt 331 lb 9.6 oz (150.413 kg)  BMI 43.76 kg/m2 General:   Alert and oriented. No distress noted. Pleasant and cooperative.  Head:  Normocephalic and atraumatic. Eyes:  Conjuctiva clear without scleral icterus. Mouth:  Oral mucosa pink and moist. Good dentition. No lesions. Neck:  Supple, without mass or thyromegaly. Heart:  S1, S2 present without murmurs, rubs, or gallops. Regular rate and rhythm. Abdomen:  +BS, soft, very mild TTP epigastric region and non-distended. No rebound or guarding. No HSM or masses noted. Msk:  Symmetrical without gross deformities. Normal posture. Extremities:  Without edema. Neurologic:  Alert and  oriented x4;  grossly normal neurologically. Skin:  Intact without significant lesions or rashes. Cervical Nodes:  No significant cervical adenopathy. Psych:  Alert and cooperative. Normal mood and affect.

## 2012-12-25 NOTE — Patient Instructions (Addendum)
We have set you up for an upper endoscopy and dilation with Dr. Jena Gauss in the near future.   Continue to take Nexium daily.   Further recommendations to follow.   3 month return.

## 2012-12-25 NOTE — Assessment & Plan Note (Addendum)
72 year old male with history of GERD and dysphagia, requiring several dilations in the past and a possible Schatzki's ring noted. Last EGD/ED by Dr. Jena Gauss in Nov 2012 with empiric dilation. Recent BPE with significant GERD, distal esophagus with smooth narrowing but no obstruction. With recurrent solid food and pill dysphagia, will proceed with EGD and dilation in near future. As of note, vague dyspepsia noted, new-onset, in the presence of daily Mobic. May be dealing with NSAID-gastritis, less likely ulcer disease. No other concerning signs noted. He most likely has underlying mild motility disorder that could be contributing to his overall symptomatology.   Proceed with upper endoscopy/dilation in the near future with Dr. Jena Gauss. The risks, benefits, and alternatives have been discussed in detail with patient. They have stated understanding and desire to proceed.

## 2013-01-01 ENCOUNTER — Encounter (HOSPITAL_COMMUNITY): Payer: Self-pay | Admitting: Pharmacy Technician

## 2013-01-15 ENCOUNTER — Ambulatory Visit (INDEPENDENT_AMBULATORY_CARE_PROVIDER_SITE_OTHER): Payer: Medicare PPO | Admitting: Otolaryngology

## 2013-01-15 DIAGNOSIS — R42 Dizziness and giddiness: Secondary | ICD-10-CM

## 2013-01-15 DIAGNOSIS — H903 Sensorineural hearing loss, bilateral: Secondary | ICD-10-CM

## 2013-01-15 DIAGNOSIS — H8110 Benign paroxysmal vertigo, unspecified ear: Secondary | ICD-10-CM

## 2013-01-16 ENCOUNTER — Encounter (HOSPITAL_COMMUNITY)
Admission: RE | Disposition: A | Discharge status: Home / Self Care | Payer: Self-pay | Source: Ambulatory Visit | Attending: Internal Medicine

## 2013-01-16 ENCOUNTER — Ambulatory Visit (HOSPITAL_COMMUNITY)
Admission: RE | Admit: 2013-01-16 | Discharge: 2013-01-16 | Disposition: A | Discharge status: Home / Self Care | Payer: Medicare PPO | Source: Ambulatory Visit | Attending: Internal Medicine | Admitting: Internal Medicine

## 2013-01-16 ENCOUNTER — Encounter (HOSPITAL_COMMUNITY): Payer: Self-pay

## 2013-01-16 DIAGNOSIS — R1310 Dysphagia, unspecified: Secondary | ICD-10-CM | POA: Insufficient documentation

## 2013-01-16 DIAGNOSIS — K219 Gastro-esophageal reflux disease without esophagitis: Secondary | ICD-10-CM | POA: Insufficient documentation

## 2013-01-16 DIAGNOSIS — K228 Other specified diseases of esophagus: Secondary | ICD-10-CM

## 2013-01-16 DIAGNOSIS — N400 Enlarged prostate without lower urinary tract symptoms: Secondary | ICD-10-CM | POA: Insufficient documentation

## 2013-01-16 DIAGNOSIS — R1319 Other dysphagia: Secondary | ICD-10-CM

## 2013-01-16 DIAGNOSIS — Z6841 Body Mass Index (BMI) 40.0 and over, adult: Secondary | ICD-10-CM | POA: Insufficient documentation

## 2013-01-16 DIAGNOSIS — I2510 Atherosclerotic heart disease of native coronary artery without angina pectoris: Secondary | ICD-10-CM | POA: Insufficient documentation

## 2013-01-16 DIAGNOSIS — K449 Diaphragmatic hernia without obstruction or gangrene: Secondary | ICD-10-CM

## 2013-01-16 DIAGNOSIS — M1990 Unspecified osteoarthritis, unspecified site: Secondary | ICD-10-CM | POA: Insufficient documentation

## 2013-01-16 DIAGNOSIS — E7800 Pure hypercholesterolemia, unspecified: Secondary | ICD-10-CM | POA: Insufficient documentation

## 2013-01-16 DIAGNOSIS — Z885 Allergy status to narcotic agent status: Secondary | ICD-10-CM | POA: Insufficient documentation

## 2013-01-16 DIAGNOSIS — Z9089 Acquired absence of other organs: Secondary | ICD-10-CM | POA: Insufficient documentation

## 2013-01-16 DIAGNOSIS — I10 Essential (primary) hypertension: Secondary | ICD-10-CM | POA: Insufficient documentation

## 2013-01-16 DIAGNOSIS — Z791 Long term (current) use of non-steroidal anti-inflammatories (NSAID): Secondary | ICD-10-CM | POA: Insufficient documentation

## 2013-01-16 DIAGNOSIS — Z9861 Coronary angioplasty status: Secondary | ICD-10-CM | POA: Insufficient documentation

## 2013-01-16 DIAGNOSIS — Z79899 Other long term (current) drug therapy: Secondary | ICD-10-CM | POA: Insufficient documentation

## 2013-01-16 HISTORY — PX: ESOPHAGOGASTRODUODENOSCOPY (EGD) WITH ESOPHAGEAL DILATION: SHX5812

## 2013-01-16 SURGERY — ESOPHAGOGASTRODUODENOSCOPY (EGD) WITH ESOPHAGEAL DILATION
Anesthesia: Moderate Sedation

## 2013-01-16 MED ORDER — BUTAMBEN-TETRACAINE-BENZOCAINE 2-2-14 % EX AERO
INHALATION_SPRAY | CUTANEOUS | Status: DC | PRN
  Administered 2013-01-16: 2 via TOPICAL

## 2013-01-16 MED ORDER — MIDAZOLAM HCL 5 MG/5ML IJ SOLN
INTRAMUSCULAR | Status: DC | PRN
  Administered 2013-01-16: 2 mg via INTRAVENOUS
  Administered 2013-01-16 (×2): 1 mg via INTRAVENOUS

## 2013-01-16 MED ORDER — SODIUM CHLORIDE 0.9 % IV SOLN
INTRAVENOUS | Status: DC
  Administered 2013-01-16: 07:00:00 via INTRAVENOUS
  Administered 2013-01-16: 1000 mL via INTRAVENOUS

## 2013-01-16 MED ORDER — MEPERIDINE HCL 100 MG/ML IJ SOLN
INTRAMUSCULAR | Status: AC
  Filled 2013-01-16: qty 1

## 2013-01-16 MED ORDER — STERILE WATER FOR IRRIGATION IR SOLN
Status: DC | PRN
  Administered 2013-01-16: 08:00:00

## 2013-01-16 MED ORDER — ONDANSETRON HCL 4 MG/2ML IJ SOLN
INTRAMUSCULAR | Status: DC | PRN
  Administered 2013-01-16: 4 mg via INTRAVENOUS

## 2013-01-16 MED ORDER — MEPERIDINE HCL 100 MG/ML IJ SOLN
INTRAMUSCULAR | Status: DC | PRN
  Administered 2013-01-16: 25 mg via INTRAVENOUS
  Administered 2013-01-16: 50 mg via INTRAVENOUS

## 2013-01-16 MED ORDER — ONDANSETRON HCL 4 MG/2ML IJ SOLN
INTRAMUSCULAR | Status: AC
  Filled 2013-01-16: qty 2

## 2013-01-16 MED ORDER — MIDAZOLAM HCL 5 MG/5ML IJ SOLN
INTRAMUSCULAR | Status: AC
  Filled 2013-01-16: qty 10

## 2013-01-16 NOTE — Interval H&P Note (Signed)
History and Physical Interval Note:  01/16/2013 7:33 AM  Gregory Wilson  has presented today for surgery, with the diagnosis of dysphagia  The various methods of treatment have been discussed with the patient and family. After consideration of risks, benefits and other options for treatment, the patient has consented to  Procedure(s) with comments: ESOPHAGOGASTRODUODENOSCOPY (EGD) WITH ESOPHAGEAL DILATION (N/A) - 7:30 as a surgical intervention .  The patient's history has been reviewed, patient examined, no change in status, stable for surgery.  I have reviewed the patient's chart and labs.  Questions were answered to the patient's satisfaction.    No change. Nexium controlling reflux symptoms very well. Prior esophageal dilation helped.The risks, benefits, limitations, alternatives and imponderables have been reviewed with the patient. Questions have been answered. All parties are agreeable.     Eula Listen

## 2013-01-16 NOTE — H&P (View-Only) (Signed)
Referring Provider: Hawkins, Edward L, MD Primary Care Physician:  HAWKINS,EDWARD L, MD Primary GI: Dr. Rourk   Chief Complaint  Patient presents with  . EGD    HPI:   72-year-old male with history of GERD and dysphagia, requiring several dilations in the past and a possible Schatzki's ring noted. Last EGD/ED by Dr. Rourk in Nov 2012 with empiric dilation. Recent BPE with significant GERD, distal esophagus with smooth narrowing but no obstruction, mild motility likely. Patient has noted some vague recurrence of dysphagia. Intermittent. Pill dysphagia, has noticed worsening of symptoms. Has to chew food longer. Washes down with liquids. No odynophagia. Has noticed epigastric "stinging" despite taking Nexium daily. Just lingers, not associated with eating/drinking. Feels like an empty spot. Takes Meloxicam 15 mg daily. No melena. Good appetite.    Past Medical History  Diagnosis Date  . Arthritis   . Hiatal hernia   . History of benign prostatic hypertrophy   . Hypercholesterolemia   . Dysphagia   . GERD (gastroesophageal reflux disease)   . Hypertension   . Atrial flutter     BRIEF/ON TREADMILL  . CAD (coronary artery disease)   . Degenerative joint disease   . S/P endoscopy Oct 2005    RMR: questionable Schatzki's ring, s/p 58 F dilation, multiple nodules of bulb with path of mild chronic active duodenitis   . Helicobacter pylori ab+ 2002    s/p treatment   . S/P colonoscopy 2002    RMR: normal  . Stented coronary artery 2003    2    Past Surgical History  Procedure Laterality Date  . Hip surgery  2005    left hip  . Replacement total knee  2004    Left  . Cholecystectomy    . Colonoscopy  05/29/2011    RMR: tubular adenoma  . Esophagogastroduodenoscopy  04/05/2004    RMR: Small hiatal hernia/Questionable subtle Schatzki's ring, status post dilation as described  . Esophagogastroduodenoscopy  05/2011    RMR: small hiatal hernia, empiric dilation with 58F and 60F  Maloney    Current Outpatient Prescriptions  Medication Sig Dispense Refill  . aspirin 325 MG tablet Take 325 mg by mouth daily.        . atorvastatin (LIPITOR) 80 MG tablet Take 1 tablet (80 mg total) by mouth daily.  90 tablet  3  . esomeprazole (NEXIUM) 40 MG capsule Take 1 capsule (40 mg total) by mouth daily before breakfast.  30 capsule  11  . irbesartan (AVAPRO) 300 MG tablet take 1 tablet by mouth once daily  30 tablet  12  . meclizine (ANTIVERT) 50 MG tablet Take 50 mg by mouth. 1/2 tablet (25mg) as needed       . meloxicam (MOBIC) 7.5 MG tablet Take 7.5 mg by mouth 2 (two) times daily.       . metoprolol tartrate (LOPRESSOR) 25 MG tablet Take 1 tablet (25 mg total) by mouth 2 (two) times daily.  60 tablet  4  . Tamsulosin HCl (FLOMAX) 0.4 MG CAPS Take by mouth daily.        . traMADol (ULTRAM-ER) 100 MG 24 hr tablet Take 100 mg by mouth daily as needed.        . hydrochlorothiazide (MICROZIDE) 12.5 MG capsule Take 1 capsule (12.5 mg total) by mouth daily.  90 capsule  4   No current facility-administered medications for this visit.    Allergies as of 12/25/2012 - Review Complete 12/25/2012  Allergen Reaction Noted  .   Codeine Nausea And Vomiting 10/28/2008    Family History  Problem Relation Age of Onset  . Hypertension Mother   . Coronary artery disease Mother   . Kidney failure Mother   . Cancer Father     Lung  . Cancer Sister     breast carcinoma  . Hypertension Sister   . Colon cancer Neg Hx     History   Social History  . Marital Status: Married    Spouse Name: N/A    Number of Children: N/A  . Years of Education: N/A   Social History Main Topics  . Smoking status: Never Smoker   . Smokeless tobacco: None  . Alcohol Use: No  . Drug Use: No  . Sexually Active: None   Other Topics Concern  . None   Social History Narrative   Retired from [employer]    Review of Systems: Negative unless mentioned in HPI  Physical Exam: BP 150/86   Pulse 69  Temp(Src) 98.4 F (36.9 C) (Oral)  Ht 6' 1" (1.854 m)  Wt 331 lb 9.6 oz (150.413 kg)  BMI 43.76 kg/m2 General:   Alert and oriented. No distress noted. Pleasant and cooperative.  Head:  Normocephalic and atraumatic. Eyes:  Conjuctiva clear without scleral icterus. Mouth:  Oral mucosa pink and moist. Good dentition. No lesions. Neck:  Supple, without mass or thyromegaly. Heart:  S1, S2 present without murmurs, rubs, or gallops. Regular rate and rhythm. Abdomen:  +BS, soft, very mild TTP epigastric region and non-distended. No rebound or guarding. No HSM or masses noted. Msk:  Symmetrical without gross deformities. Normal posture. Extremities:  Without edema. Neurologic:  Alert and  oriented x4;  grossly normal neurologically. Skin:  Intact without significant lesions or rashes. Cervical Nodes:  No significant cervical adenopathy. Psych:  Alert and cooperative. Normal mood and affect.  

## 2013-01-16 NOTE — Op Note (Signed)
[facility] [address] [place], [address]   ENDOSCOPY PROCEDURE REPORT  PATIENT: Gregory Wilson, Gregory Wilson  MR#: [id number] BIRTHDATE: [birth year] , 72  yrs. old GENDER: Male ENDOSCOPIST: R.  Roetta Sessions, MD FACP FACG REFERRED BY:  Kari Baars, M.D. PROCEDURE DATE:  01/16/2013 PROCEDURE:     EGD with Elease Hashimoto dilation followed by esophageal biopsy  INDICATIONS:     Recurrent esophageal dysphagia  INFORMED CONSENT:   The risks, benefits, limitations, alternatives and imponderables have been discussed.  The potential for biopsy, esophogeal dilation, etc. have also been reviewed.  Questions have been answered.  All parties agreeable.  Please see the history and physical in the medical record for more information.  MEDICATIONS:     Versed 4 mg IV and Demerol 75 mg IV in divided doses. Zofran 4 mg IV. Cetacaine spray.  DESCRIPTION OF PROCEDURE:   The EG-2990i ([id number])  endoscope was introduced through the mouth and advanced to the second portion of the duodenum without difficulty or limitations.  The mucosal surfaces were surveyed very carefully during advancement of the scope and upon withdrawal.  Retroflexion view of the proximal stomach and esophagogastric junction was performed.      FINDINGS:  Somewhat baggy dilated appearing esophagus. Widely patent. Normal-appearing esophageal mucosa. EG junction easily traversed. Stomach empty. Small hiatal hernia. Abnormal gastric mucosa. Patent pylorus. Nodularity the bulb is seen previously otherwise the first and second portion of the duodenum appeared normal.  THERAPEUTIC / DIAGNOSTIC MANEUVERS PERFORMED:  A 58 French Maloney dilator was passed to full insertion easily. A look back revealed no apparent complication related to this maneuver. Subsequently, biopsies of the distal esophagus were taken for histologic study   COMPLICATIONS:  None  IMPRESSION:    baggy somewhat atonic esophagus-query  underlying esophageal motility disorder. Status post passage of a Maloney dilator (large bore) empirically. Status post esophageal biopsy. Reflux symptoms are well controlled on Nexium.  RECOMMENDATIONS:  Continue Nexium 40 mg orally daily. Followup on pathology. Office visit 6 months. If dysphagia is recurrent, would consider esophageal manometry as the next step in his evaluation.    _______________________________ R. Roetta Sessions, MD FACP [facility] eSigned:  R. Roetta Sessions, MD FACP [facility] 01/16/2013 8:26 AM     CC:  PATIENT NAME:  Ramelo, Oetken MR#: [id number]

## 2013-01-20 ENCOUNTER — Encounter (HOSPITAL_COMMUNITY): Payer: Self-pay | Admitting: Internal Medicine

## 2013-01-21 ENCOUNTER — Encounter: Payer: Self-pay | Admitting: Internal Medicine

## 2013-01-29 ENCOUNTER — Other Ambulatory Visit: Payer: Self-pay | Admitting: Cardiology

## 2013-02-04 ENCOUNTER — Other Ambulatory Visit: Payer: Self-pay

## 2013-02-05 ENCOUNTER — Ambulatory Visit (INDEPENDENT_AMBULATORY_CARE_PROVIDER_SITE_OTHER): Payer: Medicare PPO | Admitting: Otolaryngology

## 2013-02-05 DIAGNOSIS — J343 Hypertrophy of nasal turbinates: Secondary | ICD-10-CM

## 2013-02-05 DIAGNOSIS — H8110 Benign paroxysmal vertigo, unspecified ear: Secondary | ICD-10-CM

## 2013-03-03 ENCOUNTER — Telehealth: Payer: Self-pay

## 2013-03-03 ENCOUNTER — Ambulatory Visit (INDEPENDENT_AMBULATORY_CARE_PROVIDER_SITE_OTHER): Payer: Medicare PPO | Admitting: Urology

## 2013-03-03 DIAGNOSIS — N401 Enlarged prostate with lower urinary tract symptoms: Secondary | ICD-10-CM

## 2013-03-03 NOTE — Telephone Encounter (Signed)
noted 

## 2013-03-03 NOTE — Telephone Encounter (Signed)
Pt came by office requesting nexium samples. Gave #4 boxes.

## 2013-03-04 ENCOUNTER — Other Ambulatory Visit: Payer: Self-pay | Admitting: Cardiology

## 2013-03-05 ENCOUNTER — Telehealth: Payer: Self-pay | Admitting: *Deleted

## 2013-03-05 ENCOUNTER — Other Ambulatory Visit: Payer: Self-pay | Admitting: *Deleted

## 2013-03-05 ENCOUNTER — Encounter: Payer: Self-pay | Admitting: Cardiology

## 2013-03-05 ENCOUNTER — Ambulatory Visit (INDEPENDENT_AMBULATORY_CARE_PROVIDER_SITE_OTHER): Payer: Medicare PPO | Admitting: Cardiology

## 2013-03-05 VITALS — BP 172/97 | HR 54 | Wt 327.0 lb

## 2013-03-05 DIAGNOSIS — I4892 Unspecified atrial flutter: Secondary | ICD-10-CM

## 2013-03-05 DIAGNOSIS — I2510 Atherosclerotic heart disease of native coronary artery without angina pectoris: Secondary | ICD-10-CM

## 2013-03-05 DIAGNOSIS — E7800 Pure hypercholesterolemia, unspecified: Secondary | ICD-10-CM

## 2013-03-05 DIAGNOSIS — I10 Essential (primary) hypertension: Secondary | ICD-10-CM

## 2013-03-05 MED ORDER — AMLODIPINE BESYLATE 10 MG PO TABS: 10.0000 mg | ORAL_TABLET | Freq: Every day | ORAL | Status: DC

## 2013-03-05 MED ORDER — METOPROLOL SUCCINATE ER 25 MG PO TB24: 25.0000 mg | ORAL_TABLET | Freq: Every day | ORAL | Status: DC

## 2013-03-05 NOTE — Telephone Encounter (Signed)
Patient called in requesting a refill for metoprolol succ. 25mg  daily. He assured me that this was correct given that the pharmacy sent it in via e-scribe for 50mg  daily and telephone note from 08/24/11 in chart says it was changed to metop tart 25mg  bid

## 2013-03-05 NOTE — Assessment & Plan Note (Signed)
Continue aspirin and statin. 

## 2013-03-05 NOTE — Assessment & Plan Note (Signed)
Continue statin. Lipids and liver monitored by primary care. 

## 2013-03-05 NOTE — Progress Notes (Signed)
HPI: Pleasant male with past medical history of coronary disease who returns for followup. The patient has had prior PCI of his LAD. Last myoview in Oct 2012 showed an ejection fraction of 67% and normal perfusion. Since I last saw him in March of 2013, the patient denies any dyspnea on exertion, orthopnea, PND, palpitations, syncope or chest pain. Occasional mild pedal edema.   Current Outpatient Prescriptions  Medication Sig Dispense Refill  . amLODipine (NORVASC) 10 MG tablet Take 1 tablet (10 mg total) by mouth daily.  30 tablet  12  . aspirin 325 MG tablet Take 325 mg by mouth daily.        Marland Kitchen atorvastatin (LIPITOR) 80 MG tablet Take 1 tablet (80 mg total) by mouth daily.  90 tablet  3  . esomeprazole (NEXIUM) 40 MG capsule Take 1 capsule (40 mg total) by mouth daily before breakfast.  30 capsule  11  . hydrochlorothiazide (MICROZIDE) 12.5 MG capsule Take 12.5 mg by mouth daily.      . irbesartan (AVAPRO) 300 MG tablet Take 300 mg by mouth at bedtime.      . meclizine (ANTIVERT) 50 MG tablet Take 50 mg by mouth 3 (three) times daily as needed for dizziness.      . meloxicam (MOBIC) 7.5 MG tablet Take 7.5 mg by mouth 2 (two) times daily.       . metoprolol succinate (TOPROL-XL) 25 MG 24 hr tablet Take 25 mg by mouth daily.      . Tamsulosin HCl (FLOMAX) 0.4 MG CAPS Take 0.4 mg by mouth daily.        No current facility-administered medications for this visit.     Past Medical History  Diagnosis Date  . Arthritis   . Hiatal hernia   . History of benign prostatic hypertrophy   . Hypercholesterolemia   . Dysphagia   . GERD (gastroesophageal reflux disease)   . Hypertension   . Atrial flutter     BRIEF/ON TREADMILL  . CAD (coronary artery disease)   . Degenerative joint disease   . S/P endoscopy Oct 2005    RMR: questionable Schatzki's ring, s/p 58 F dilation, multiple nodules of bulb with path of mild chronic active duodenitis   . Helicobacter pylori ab+ 2002    s/p treatment    . S/P colonoscopy 2002    RMR: normal  . Stented coronary artery 2003    2    Past Surgical History  Procedure Laterality Date  . Hip surgery  2005    left hip  . Replacement total knee  2004    Left  . Cholecystectomy    . Colonoscopy  05/29/2011    RMR: tubular adenoma  . Esophagogastroduodenoscopy  04/05/2004    RMR: Small hiatal hernia/Questionable subtle Schatzki's ring, status post dilation as described  . Esophagogastroduodenoscopy  05/2011    RMR: small hiatal hernia, empiric dilation with 40F and 49F Maloney  . Esophagogastroduodenoscopy (egd) with esophageal dilation N/A 01/16/2013    Procedure: ESOPHAGOGASTRODUODENOSCOPY (EGD) WITH ESOPHAGEAL DILATION;  Surgeon: Corbin Ade, MD;  Location: [facility];  Service: Endoscopy;  Laterality: N/A;  7:30    History   Social History  . Marital Status: Married    Spouse Name: N/A    Number of Children: N/A  . Years of Education: N/A   Occupational History  . Not on file.   Social History Main Topics  . Smoking status: Never Smoker   . Smokeless tobacco: Not  on file  . Alcohol Use: No  . Drug Use: No  . Sexual Activity: Not on file   Other Topics Concern  . Not on file   Social History Narrative   Retired from [employer]    ROS: no fevers or chills, productive cough, hemoptysis, dysphasia, odynophagia, melena, hematochezia, dysuria, hematuria, rash, seizure activity, orthopnea, PND, pedal edema, claudication. Remaining systems are negative.  Physical Exam: Well-developed obese in no acute distress.  Skin is warm and dry.  HEENT is normal.  Neck is supple.  Chest is clear to auscultation with normal expansion.  Cardiovascular exam is regular rate and rhythm.  Abdominal exam nontender or distended. No masses palpated. Extremities show trace edema. neuro grossly intact  ECG sinus rhythm at a rate of 56. Right bundle branch block. Prior inferior infarct. Left ventricular  hypertrophy.

## 2013-03-05 NOTE — Assessment & Plan Note (Signed)
Blood pressure elevated. Increase amlodipine to 10 mg daily. 

## 2013-03-05 NOTE — Assessment & Plan Note (Signed)
Occurred transiently on the treadmill. Previously declined Coumadin.

## 2013-03-05 NOTE — Patient Instructions (Addendum)
Your physician wants you to follow-up in: ONE YEAR WITH DR Shelda Pal will receive a reminder letter in the mail two months in advance. If you don't receive a letter, please call our office to schedule the follow-up appointment.   INCREASE AMLODIPINE 10 MG ONCE DAILY

## 2013-03-26 ENCOUNTER — Ambulatory Visit (INDEPENDENT_AMBULATORY_CARE_PROVIDER_SITE_OTHER): Payer: Medicare PPO | Admitting: Otolaryngology

## 2013-03-26 DIAGNOSIS — J310 Chronic rhinitis: Secondary | ICD-10-CM

## 2013-03-26 DIAGNOSIS — H8110 Benign paroxysmal vertigo, unspecified ear: Secondary | ICD-10-CM

## 2013-05-07 ENCOUNTER — Other Ambulatory Visit: Payer: Self-pay

## 2013-06-02 ENCOUNTER — Ambulatory Visit: Payer: Medicare PPO | Admitting: Urology

## 2013-06-23 ENCOUNTER — Ambulatory Visit (INDEPENDENT_AMBULATORY_CARE_PROVIDER_SITE_OTHER): Payer: Medicare PPO | Admitting: Urology

## 2013-06-23 DIAGNOSIS — N401 Enlarged prostate with lower urinary tract symptoms: Secondary | ICD-10-CM

## 2013-07-03 NOTE — Progress Notes (Signed)
REVIEWED.  

## 2013-07-16 ENCOUNTER — Telehealth: Payer: Self-pay

## 2013-07-16 ENCOUNTER — Ambulatory Visit: Payer: Medicare PPO | Admitting: Gastroenterology

## 2013-07-16 NOTE — Telephone Encounter (Signed)
noted 

## 2013-07-16 NOTE — Telephone Encounter (Signed)
Pt had appt today with AS. The appt had to be rescheduled d/t AS out of the office. Pt is scheduled for 08/11/13 with AS. He requested samples of nexium. Gave #5 bottles of nexium.

## 2013-08-11 ENCOUNTER — Ambulatory Visit (INDEPENDENT_AMBULATORY_CARE_PROVIDER_SITE_OTHER): Payer: Medicare PPO | Admitting: Gastroenterology

## 2013-08-11 ENCOUNTER — Encounter: Payer: Self-pay | Admitting: Gastroenterology

## 2013-08-11 VITALS — BP 144/87 | HR 69 | Temp 98.0°F | Wt 328.2 lb

## 2013-08-11 DIAGNOSIS — K219 Gastro-esophageal reflux disease without esophagitis: Secondary | ICD-10-CM

## 2013-08-11 DIAGNOSIS — Z860100 Personal history of colon polyps, unspecified: Secondary | ICD-10-CM | POA: Insufficient documentation

## 2013-08-11 DIAGNOSIS — R1310 Dysphagia, unspecified: Secondary | ICD-10-CM

## 2013-08-11 NOTE — Assessment & Plan Note (Signed)
Continue Nexium once daily. Return in 1 year or sooner as needed.

## 2013-08-11 NOTE — Patient Instructions (Signed)
Continue Nexium each morning, 30 minutes before breakfast.   Start taking Metamucil each day. You could also take Benefiber. Hopefully, this will help regulate your bowel habits.  If you have any further issues, please call us. Otherwise, we will see you back in 1 year!

## 2013-08-11 NOTE — Progress Notes (Signed)
Referring Provider: Alonza Bogus, MD Primary Care Physician:  Alonza Bogus, MD Primary GI: Dr. Gala Romney   Chief Complaint  Patient presents with  . Follow-up    HPI:   Gregory Wilson presents today in follow-up after EGD with empiric dilation.  Denies any dysphagia, odynophagia. Good appetite. GERD controlled with Nexium daily, as long as he takes it. Denies abdominal pain. Bowel habits range from one extreme to the other. Sometimes constipated, sometimes loose. Doesn't ever know "what's coming". If traveling, usually constipated. Will take an enema with him.   Due for surveillance colonoscopy in Nov 2017. No rectal bleeding. Not taking supplemental fiber.   Past Medical History  Diagnosis Date  . Arthritis   . Hiatal hernia   . History of benign prostatic hypertrophy   . Hypercholesterolemia   . Dysphagia   . GERD (gastroesophageal reflux disease)   . Hypertension   . Atrial flutter     BRIEF/ON TREADMILL  . CAD (coronary artery disease)   . Degenerative joint disease   . S/P endoscopy Oct 2005    RMR: questionable Schatzki's ring, s/p 64 F dilation, multiple nodules of bulb with path of mild chronic active duodenitis   . Helicobacter pylori ab+ 2002    s/p treatment   . S/P colonoscopy 2002    RMR: normal  . Stented coronary artery 2003    2    Past Surgical History  Procedure Laterality Date  . Hip surgery  2005    left hip  . Replacement total knee  2004    Left  . Cholecystectomy    . Colonoscopy  05/29/2011    RMR: tubular adenoma  . Esophagogastroduodenoscopy  04/05/2004    RMR: Small hiatal hernia/Questionable subtle Schatzki's ring, status post dilation as described  . Esophagogastroduodenoscopy  05/2011    RMR: small hiatal hernia, empiric dilation with 7F and 5F Maloney  . Esophagogastroduodenoscopy (egd) with esophageal dilation N/A 01/16/2013    Dr. Rourk:baggy somewhat atonic esophagus-query underlying esophageal motility disorder.  Status post passage of a Maloney dilator (large bore) empirically. Status post esophageal biopsy/Reflux symptoms are well controlled on Nexium.benign path    Current Outpatient Prescriptions  Medication Sig Dispense Refill  . amLODipine (NORVASC) 10 MG tablet Take 1 tablet (10 mg total) by mouth daily.  30 tablet  12  . aspirin 325 MG tablet Take 325 mg by mouth daily.        Marland Kitchen atorvastatin (LIPITOR) 80 MG tablet Take 1 tablet (80 mg total) by mouth daily.  90 tablet  3  . esomeprazole (NEXIUM) 40 MG capsule Take 1 capsule (40 mg total) by mouth daily before breakfast.  30 capsule  11  . finasteride (PROSCAR) 5 MG tablet Take 5 mg by mouth daily.       . hydrochlorothiazide (MICROZIDE) 12.5 MG capsule Take 12.5 mg by mouth daily.      . irbesartan (AVAPRO) 300 MG tablet Take 300 mg by mouth at bedtime.      . meclizine (ANTIVERT) 50 MG tablet Take 50 mg by mouth 3 (three) times daily as needed for dizziness.      . meloxicam (MOBIC) 7.5 MG tablet Take 7.5 mg by mouth 2 (two) times daily.       . metoprolol succinate (TOPROL-XL) 25 MG 24 hr tablet Take 1 tablet (25 mg total) by mouth daily.  30 tablet  5  . Tamsulosin HCl (FLOMAX) 0.4 MG CAPS Take 0.4 mg by mouth  daily.        No current facility-administered medications for this visit.    Allergies as of 08/11/2013 - Review Complete 08/11/2013  Allergen Reaction Noted  . Codeine Nausea And Vomiting 10/28/2008    Family History  Problem Relation Age of Onset  . Hypertension Mother   . Coronary artery disease Mother   . Kidney failure Mother   . Cancer Father     Lung  . Cancer Sister     breast carcinoma  . Hypertension Sister   . Colon cancer Neg Hx     History   Social History  . Marital Status: Married    Spouse Name: N/A    Number of Children: N/A  . Years of Education: N/A   Social History Main Topics  . Smoking status: Never Smoker   . Smokeless tobacco: None  . Alcohol Use: No  . Drug Use: No  . Sexual  Activity: None   Other Topics Concern  . None   Social History Narrative   Retired from [employer]: Negative unless mentioned in HPI.   Physical Exam: BP 144/87  Pulse 69  Temp(Src) 98 F (36.7 C) (Oral)  Wt 328 lb 3.2 oz (148.871 kg) General:   Alert and oriented. No distress noted. Pleasant and cooperative.  Head:  Normocephalic and atraumatic. Eyes:  Conjuctiva clear without scleral icterus. Abdomen:  +BS, soft, non-tender and non-distended. No rebound or guarding. No HSM or masses noted. Msk:  Symmetrical without gross deformities. Normal posture. Extremities:  Without edema. Neurologic:  Alert and  oriented x4;  grossly normal neurologically. Skin:  Intact without significant lesions or rashes. Psych:  Alert and cooperative. Normal mood and affect.

## 2013-08-11 NOTE — Assessment & Plan Note (Signed)
Resolved. If continues, needs manometry.

## 2013-08-11 NOTE — Assessment & Plan Note (Signed)
Due for surveillance Nov 2017. Notes intermittent constipation, sometimes loose stool. No concerning symptoms. Start supplemental fiber daily. Miralax prn. Pt to contact us if any worsening of symptoms.

## 2013-08-12 NOTE — Progress Notes (Signed)
cc'd to pcp 

## 2013-09-16 ENCOUNTER — Other Ambulatory Visit: Payer: Self-pay | Admitting: Cardiology

## 2013-10-19 ENCOUNTER — Other Ambulatory Visit: Payer: Self-pay | Admitting: Cardiology

## 2013-10-20 ENCOUNTER — Ambulatory Visit (INDEPENDENT_AMBULATORY_CARE_PROVIDER_SITE_OTHER): Payer: Medicare PPO | Admitting: Urology

## 2013-10-20 DIAGNOSIS — N401 Enlarged prostate with lower urinary tract symptoms: Secondary | ICD-10-CM

## 2013-10-20 DIAGNOSIS — N138 Other obstructive and reflux uropathy: Secondary | ICD-10-CM

## 2013-10-21 ENCOUNTER — Other Ambulatory Visit: Payer: Self-pay | Admitting: Urology

## 2013-10-23 ENCOUNTER — Telehealth: Payer: Self-pay | Admitting: *Deleted

## 2013-10-23 NOTE — Telephone Encounter (Signed)
Received fax from [facility] requesting clearance for TURP with gyrus. Per dr Stanford Breed the pt will need to be seen for clearance. Left message for pt to call to get follow up with the pa

## 2013-10-26 NOTE — Telephone Encounter (Signed)
Follow up scheduled

## 2013-11-04 ENCOUNTER — Telehealth: Payer: Self-pay | Admitting: Internal Medicine

## 2013-11-04 NOTE — Telephone Encounter (Signed)
Pt is at the window asking if he is able to get Nexium samples. Please advise. Patient in waiting room.

## 2013-11-04 NOTE — Telephone Encounter (Signed)
Gave pt #2 bottles of nexium.

## 2013-11-12 ENCOUNTER — Encounter: Payer: Self-pay | Admitting: Physician Assistant

## 2013-11-12 ENCOUNTER — Ambulatory Visit (INDEPENDENT_AMBULATORY_CARE_PROVIDER_SITE_OTHER): Payer: Medicare PPO | Admitting: Physician Assistant

## 2013-11-12 VITALS — BP 140/94 | HR 72 | Ht 73.0 in | Wt 314.0 lb

## 2013-11-12 DIAGNOSIS — I2510 Atherosclerotic heart disease of native coronary artery without angina pectoris: Secondary | ICD-10-CM

## 2013-11-12 DIAGNOSIS — I10 Essential (primary) hypertension: Secondary | ICD-10-CM

## 2013-11-12 DIAGNOSIS — I4892 Unspecified atrial flutter: Secondary | ICD-10-CM

## 2013-11-12 DIAGNOSIS — Z01810 Encounter for preprocedural cardiovascular examination: Secondary | ICD-10-CM

## 2013-11-12 DIAGNOSIS — E785 Hyperlipidemia, unspecified: Secondary | ICD-10-CM

## 2013-11-12 MED ORDER — ATORVASTATIN CALCIUM 80 MG PO TABS: 80.0000 mg | ORAL_TABLET | Freq: Every day | ORAL | Status: DC

## 2013-11-12 MED ORDER — HYDROCHLOROTHIAZIDE 12.5 MG PO CAPS: 12.5000 mg | ORAL_CAPSULE | Freq: Every day | ORAL | Status: DC

## 2013-11-12 NOTE — Progress Notes (Signed)
[place] [place], [place]  [address] Phone: [phone] Fax:  [phone]  Date:  11/12/2013   ID:  Gregory Wilson, DOB 04-15-1941, MRN [id number]  PCP:  Alonza Bogus, MD  Cardiologist:  Dr. Kirk Ruths      History of Present Illness: Gregory Wilson is a 73 y.o. male with a hx of CAD s/p DES to LAD and BMS to Dx in 2003, HL, HTN, AFlutter (transient on treadmill - declined coumadin).  Last seen by Dr. Kirk Ruths in 03/2013.  He has BPH with LUTS and is in need of undergoing TURP with Dr. Diona Fanti.  He returns for surgical clearance. The patient has significant DJD which limits his activity. He is not that active. He cannot achieve 4 METs.  He has recently been diagnosed with vertigo. He sometimes gets dizzy in the mornings. He does complain of fatigue. He denies chest pain or dyspnea. He denies orthopnea, PND. He has some mild pedal edema without change. He denies syncope.   Studies:  - LHC (10/2001):  Mid LAD 70%, Ostial D1 80%, CFX and RCA normal, EF 65%.  PCI:  Cypher DES to LAD and Express BMS to Dx.    - Nuclear (04/2011):  No ischemia, EF 67%, Nomal   Recent Labs: No results found for requested labs within last 365 days.  Wt Readings from Last 3 Encounters:  11/12/13 314 lb (142.429 kg)  08/11/13 328 lb 3.2 oz (148.871 kg)  03/05/13 327 lb (148.326 kg)     Past Medical History  Diagnosis Date  . Arthritis   . Hiatal hernia   . History of benign prostatic hypertrophy   . Hypercholesterolemia   . Dysphagia   . GERD (gastroesophageal reflux disease)   . Hypertension   . Atrial flutter     BRIEF/ON TREADMILL  . CAD (coronary artery disease)   . Degenerative joint disease   . S/P endoscopy Oct 2005    RMR: questionable Schatzki's ring, s/p 38 F dilation, multiple nodules of bulb with path of mild chronic active duodenitis   . Helicobacter pylori ab+ 2002    s/p treatment   . S/P colonoscopy 2002    RMR: normal  . Stented coronary  artery 2003    2    Current Outpatient Prescriptions  Medication Sig Dispense Refill  . amLODipine (NORVASC) 10 MG tablet Take 1 tablet (10 mg total) by mouth daily.  30 tablet  12  . aspirin 325 MG tablet Take 325 mg by mouth daily.        Marland Kitchen esomeprazole (NEXIUM) 40 MG capsule Take 1 capsule (40 mg total) by mouth daily before breakfast.  30 capsule  11  . finasteride (PROSCAR) 5 MG tablet Take 5 mg by mouth daily.       . irbesartan (AVAPRO) 300 MG tablet Take 300 mg by mouth at bedtime.      . meclizine (ANTIVERT) 50 MG tablet Take 50 mg by mouth 3 (three) times daily as needed for dizziness.      . meloxicam (MOBIC) 7.5 MG tablet Take 7.5 mg by mouth 2 (two) times daily.       . metoprolol succinate (TOPROL-XL) 25 MG 24 hr tablet take 1 tablet by mouth once daily  30 tablet  5  . Tamsulosin HCl (FLOMAX) 0.4 MG CAPS Take 0.4 mg by mouth daily.       Marland Kitchen atorvastatin (LIPITOR) 80 MG tablet Take 1 tablet (80 mg total)  by mouth daily.  90 tablet  3  . hydrochlorothiazide (MICROZIDE) 12.5 MG capsule Take 12.5 mg by mouth daily.       No current facility-administered medications for this visit.    Allergies:   Codeine   Social History:  The patient  reports that he has never smoked. He does not have any smokeless tobacco history on file. He reports that he does not drink alcohol or use illicit drugs.   Family History:  The patient's family history includes Cancer in his father and sister; Coronary artery disease in his mother; Hypertension in his mother and sister; Kidney failure in his mother. There is no history of Colon cancer.   ROS:  Please see the history of present illness.      All other systems reviewed and negative.   PHYSICAL EXAM: VS:  BP 140/94  Pulse 72  Ht 6\' 1"  (1.854 m)  Wt 314 lb (142.429 kg)  BMI 41.44 kg/m2 Well nourished, well developed, in no acute distress HEENT: normal Neck: no JVD Cardiac:  normal S1, S2; RRR; no murmur Lungs:  clear to auscultation  bilaterally, no wheezing, rhonchi or rales Abd: soft, nontender, no hepatomegaly Ext: trace bilateral LE edema Skin: warm and dry Neuro:  CNs 2-12 intact, no focal abnormalities noted  EKG:  NSR HR 72, RBBB     ASSESSMENT AND PLAN:  1. Pre-operative cardiovascular examination:  He has a hx of CAD s/p PCI with DES to his LAD and BMS to his Dx in 2003.  He denies chest pain or dyspnea. But, he cannot achieve 4 METs.  He is significantly limited by DJD.  It has been almost 3 years since his last assessment for ischemia.  I will arrange Lexiscan Myoview for risk stratification.  Further recommendations to follow. 2. CAD:  Proceed with myoview as noted.  Continue ASA.  If possible, it would be best to continue ASA throughout his surgery. However, if the bleeding risk is too great, it can be held.  But, should be resumed as soon as felt to be safe.  He has a 1st generation drug eluting stent.  This was placed 10+ years ago.  The likelihood of stent thrombosis while off of antiplatelet Rx is quite low.  Continue beta blocker, statin. 3. HYPERTENSION: Blood pressure is uncontrolled. He stopped taking hydrochlorothiazide several months ago for unclear reasons. I have asked him to resume this. Repeat basic metabolic panel in the next couple of weeks. 4. HYPERLIPIDEMIA: He stopped taking atorvastatin several months ago for unclear reasons. I have recommended that he resume this. 5. Atrial flutter: Transient on treadmill in the past. No evidence of recurrence. He has declined Coumadin in the past. 6. Disposition: Followup with Dr. Stanford Breed in 6 months.  Signed, Richardson Dopp, PA-C  11/12/2013 12:12 PM

## 2013-11-12 NOTE — Patient Instructions (Signed)
LAB WORK THE DAY OF YOUR STRESS TEST (BMET)  Your physician has requested that you have a lexiscan myoview. For further information please visit [email or web address]. Please follow instruction sheet, as given.  Your physician wants you to follow-up in: [place] DR. CRENSHAW. You will receive a reminder letter in the mail two months in advance. If you don't receive a letter, please call our office to schedule the follow-up appointment.   RESTART LIPITOR AND HCTZ

## 2013-12-03 ENCOUNTER — Encounter: Payer: Self-pay | Admitting: Cardiology

## 2013-12-09 ENCOUNTER — Ambulatory Visit (HOSPITAL_COMMUNITY): Payer: Medicare PPO | Attending: Cardiology | Admitting: Radiology

## 2013-12-09 VITALS — BP 146/76 | Ht 73.0 in | Wt 306.0 lb

## 2013-12-09 DIAGNOSIS — Z01810 Encounter for preprocedural cardiovascular examination: Secondary | ICD-10-CM

## 2013-12-09 DIAGNOSIS — R42 Dizziness and giddiness: Secondary | ICD-10-CM | POA: Diagnosis present

## 2013-12-09 DIAGNOSIS — I4892 Unspecified atrial flutter: Secondary | ICD-10-CM | POA: Insufficient documentation

## 2013-12-09 DIAGNOSIS — I4510 Unspecified right bundle-branch block: Secondary | ICD-10-CM | POA: Diagnosis not present

## 2013-12-09 DIAGNOSIS — Z8249 Family history of ischemic heart disease and other diseases of the circulatory system: Secondary | ICD-10-CM | POA: Diagnosis not present

## 2013-12-09 DIAGNOSIS — R002 Palpitations: Secondary | ICD-10-CM | POA: Insufficient documentation

## 2013-12-09 DIAGNOSIS — I10 Essential (primary) hypertension: Secondary | ICD-10-CM | POA: Diagnosis not present

## 2013-12-09 DIAGNOSIS — I2510 Atherosclerotic heart disease of native coronary artery without angina pectoris: Secondary | ICD-10-CM | POA: Diagnosis not present

## 2013-12-09 DIAGNOSIS — Z9861 Coronary angioplasty status: Secondary | ICD-10-CM | POA: Insufficient documentation

## 2013-12-09 DIAGNOSIS — R0602 Shortness of breath: Secondary | ICD-10-CM

## 2013-12-09 HISTORY — PX: CARDIOVASCULAR STRESS TEST: SHX262

## 2013-12-09 MED ORDER — TECHNETIUM TC 99M SESTAMIBI GENERIC - CARDIOLITE
33.0000 | Freq: Once | INTRAVENOUS | Status: AC | PRN
  Administered 2013-12-09: 33 via INTRAVENOUS

## 2013-12-09 MED ORDER — REGADENOSON 0.4 MG/5ML IV SOLN
0.4000 mg | Freq: Once | INTRAVENOUS | Status: AC
  Administered 2013-12-09: 0.4 mg via INTRAVENOUS

## 2013-12-09 NOTE — Progress Notes (Signed)
[facility] [address] [place], [place] [address] [phone]    Cardiology Nuclear Med Study  Gregory Wilson is a 73 y.o. male     MRN : [id number]     DOB: [birth year]  Procedure Date: 12/09/2013  Nuclear Med Background Indication for Stress Test:  Evaluation for Ischemia, Surgical Clearance, Stent Patency, PTCA Patency and TURP Dr. Diona Fanti History:  CAD,CATH,STENT, AFLUTTER, MPI: NL Cardiac Risk Factors: Family History - CAD, Hypertension, Lipids and RBBB  Symptoms:  Dizziness, Fatigue and Palpitations   Nuclear Pre-Procedure Caffeine/Decaff Intake:  None NPO After: 6:30 pm   Lungs:  clear O2 Sat: 96% on room air. IV 0.9% NS with Angio Cath:  22g  IV Site: L Antecubital  IV Started by:  Perrin Maltese, EMT-P  Chest Size (in):  50 Cup Size: n/a  Height: 6\' 1"  (1.854 m)  Weight:  306 lb (138.801 kg)  BMI:  Body mass index is 40.38 kg/(m^2). Tech Comments:  Rx this am    Nuclear Med Study 1 or 2 day study: 2 day  Stress Test Type:  Carlton Adam  Reading MD: n/a  Order Authorizing Provider:  B.Crenshaw Md  Resting Radionuclide: Technetium 73m Sestamibi  Resting Radionuclide Dose: 33.0 mCi on 12/14/13   Stress Radionuclide:  Technetium 65m Sestamibi  Stress Radionuclide Dose: 33.0 mCi on 12/09/13           Stress Protocol Rest HR: 57 Stress HR: 86  Rest BP: 146/76 Stress BP: 149/99  Exercise Time (min): n/a METS: n/a   Predicted Max HR: 149 bpm % Max HR: 57.72 bpm Rate Pressure Product: 12814   Dose of Adenosine (mg):  n/a Dose of Lexiscan: 0.4 mg  Dose of Atropine (mg): n/a Dose of Dobutamine: n/a mcg/kg/min (at max HR)  Stress Test Technologist: Perrin Maltese, EMT-P  Nuclear Technologist:  Charlton Amor, CNMT     Rest Procedure:  Myocardial perfusion imaging was performed at rest 45 minutes following the intravenous administration of Technetium 34m Sestamibi. Rest ECG: NSR-RBBB  Stress Procedure:  The patient received IV  Lexiscan 0.4 mg over 15-seconds.  Technetium 34m Sestamibi injected at 30-seconds. This patient had sob with the Lexiscan injection. Quantitative spect images were obtained after a 45 minute delay. Stress ECG: No significant change from baseline ECG  QPS Raw Data Images:  Normal; no motion artifact; normal heart/lung ratio. Stress Images:  Normal homogeneous uptake in all areas of the myocardium. Rest Images:  Normal homogeneous uptake in all areas of the myocardium. Subtraction (SDS):  No evidence of ischemia. Transient Ischemic Dilatation (Normal <1.22):  0.94 Lung/Heart Ratio (Normal <0.45):  0.27  Quantitative Gated Spect Images QGS EDV:  109 ml QGS ESV:  38 ml  Impression Exercise Capacity:  Lexiscan with low level exercise. BP Response:  Normal blood pressure response. Clinical Symptoms:  No chest pain. ECG Impression:  No significant ST segment change suggestive of ischemia. Comparison with Prior Nuclear Study: No significant change from previous study  Overall Impression:  Normal stress nuclear study.  LV Ejection Fraction: 65%.  LV Wall Motion:  NL LV Function; NL Wall Motion  Darlin Coco MD

## 2013-12-10 ENCOUNTER — Encounter (HOSPITAL_BASED_OUTPATIENT_CLINIC_OR_DEPARTMENT_OTHER): Payer: Self-pay | Admitting: *Deleted

## 2013-12-11 ENCOUNTER — Encounter (HOSPITAL_BASED_OUTPATIENT_CLINIC_OR_DEPARTMENT_OTHER): Payer: Self-pay | Admitting: *Deleted

## 2013-12-14 ENCOUNTER — Ambulatory Visit (HOSPITAL_COMMUNITY): Payer: Medicare PPO | Attending: Cardiology

## 2013-12-14 DIAGNOSIS — R0989 Other specified symptoms and signs involving the circulatory and respiratory systems: Secondary | ICD-10-CM

## 2013-12-14 MED ORDER — TECHNETIUM TC 99M SESTAMIBI GENERIC - CARDIOLITE
30.0000 | Freq: Once | INTRAVENOUS | Status: AC | PRN
  Administered 2013-12-14: 30 via INTRAVENOUS

## 2013-12-15 ENCOUNTER — Encounter: Payer: Self-pay | Admitting: Physician Assistant

## 2013-12-15 ENCOUNTER — Telehealth: Payer: Self-pay | Admitting: *Deleted

## 2013-12-15 NOTE — Telephone Encounter (Signed)
pt notified about myoview normal and ok to have surgery with [facility]. I will fax results to surgeron (Dr. Diona Fanti). Pt verbalized understanding to results.

## 2013-12-15 NOTE — Telephone Encounter (Signed)
lmptcb for myoview results and ok to have surgery

## 2013-12-16 ENCOUNTER — Encounter (HOSPITAL_BASED_OUTPATIENT_CLINIC_OR_DEPARTMENT_OTHER): Payer: Self-pay | Admitting: *Deleted

## 2013-12-16 NOTE — Progress Notes (Signed)
Pt instructed npo p mn  6/21 x norvasc, toprol, nexium w sip of water.  Needs istat on arrival.  RCC guidelines reviewed w pt .  Pt aware to bring home meds.  Pt verbalized his understanding.

## 2013-12-21 ENCOUNTER — Encounter (HOSPITAL_COMMUNITY)
Admission: RE | Disposition: A | Discharge status: Home / Self Care | Payer: Self-pay | Source: Ambulatory Visit | Attending: Urology

## 2013-12-21 ENCOUNTER — Encounter (HOSPITAL_BASED_OUTPATIENT_CLINIC_OR_DEPARTMENT_OTHER): Payer: Self-pay | Admitting: *Deleted

## 2013-12-21 ENCOUNTER — Inpatient Hospital Stay (HOSPITAL_BASED_OUTPATIENT_CLINIC_OR_DEPARTMENT_OTHER)
Admission: RE | Admit: 2013-12-21 | Discharge: 2013-12-25 | DRG: 714 | Disposition: A | Discharge status: Home / Self Care | Payer: Medicare PPO | Source: Ambulatory Visit | Attending: Urology | Admitting: Urology

## 2013-12-21 ENCOUNTER — Ambulatory Visit (HOSPITAL_BASED_OUTPATIENT_CLINIC_OR_DEPARTMENT_OTHER): Payer: Medicare PPO | Admitting: Anesthesiology

## 2013-12-21 ENCOUNTER — Encounter (HOSPITAL_BASED_OUTPATIENT_CLINIC_OR_DEPARTMENT_OTHER): Payer: Medicare PPO | Admitting: Anesthesiology

## 2013-12-21 DIAGNOSIS — I10 Essential (primary) hypertension: Secondary | ICD-10-CM | POA: Diagnosis present

## 2013-12-21 DIAGNOSIS — N138 Other obstructive and reflux uropathy: Principal | ICD-10-CM | POA: Diagnosis present

## 2013-12-21 DIAGNOSIS — R319 Hematuria, unspecified: Secondary | ICD-10-CM | POA: Diagnosis present

## 2013-12-21 DIAGNOSIS — Z8249 Family history of ischemic heart disease and other diseases of the circulatory system: Secondary | ICD-10-CM | POA: Diagnosis not present

## 2013-12-21 DIAGNOSIS — I2510 Atherosclerotic heart disease of native coronary artery without angina pectoris: Secondary | ICD-10-CM | POA: Diagnosis present

## 2013-12-21 DIAGNOSIS — N401 Enlarged prostate with lower urinary tract symptoms: Principal | ICD-10-CM

## 2013-12-21 DIAGNOSIS — Z9861 Coronary angioplasty status: Secondary | ICD-10-CM

## 2013-12-21 DIAGNOSIS — N320 Bladder-neck obstruction: Secondary | ICD-10-CM | POA: Diagnosis present

## 2013-12-21 DIAGNOSIS — R339 Retention of urine, unspecified: Secondary | ICD-10-CM | POA: Diagnosis present

## 2013-12-21 DIAGNOSIS — Z860100 Personal history of colon polyps, unspecified: Secondary | ICD-10-CM

## 2013-12-21 DIAGNOSIS — Z96659 Presence of unspecified artificial knee joint: Secondary | ICD-10-CM | POA: Diagnosis not present

## 2013-12-21 DIAGNOSIS — Z96649 Presence of unspecified artificial hip joint: Secondary | ICD-10-CM

## 2013-12-21 HISTORY — DX: Irritable bowel syndrome, unspecified: K58.9

## 2013-12-21 HISTORY — DX: Adverse effect of unspecified anesthetic, initial encounter: T41.45XA

## 2013-12-21 HISTORY — DX: Personal history of other diseases of the circulatory system: Z86.79

## 2013-12-21 HISTORY — DX: Personal history of colonic polyps: Z86.010

## 2013-12-21 HISTORY — DX: Other specified postprocedural states: Z98.890

## 2013-12-21 HISTORY — DX: Personal history of colon polyps, unspecified: Z86.0100

## 2013-12-21 HISTORY — DX: Unspecified symptoms and signs involving the genitourinary system: R39.9

## 2013-12-21 HISTORY — DX: Unspecified right bundle-branch block: I45.10

## 2013-12-21 HISTORY — DX: Presence of coronary angioplasty implant and graft: Z95.5

## 2013-12-21 HISTORY — DX: Personal history of other diseases of the digestive system: Z87.19

## 2013-12-21 HISTORY — DX: Esophageal obstruction: K22.2

## 2013-12-21 HISTORY — PX: TRANSURETHRAL RESECTION OF PROSTATE: SHX73

## 2013-12-21 HISTORY — DX: Benign prostatic hyperplasia without lower urinary tract symptoms: N40.0

## 2013-12-21 HISTORY — DX: Other complications of anesthesia, initial encounter: T88.59XA

## 2013-12-21 HISTORY — DX: Hyperlipidemia, unspecified: E78.5

## 2013-12-21 LAB — POCT I-STAT, CHEM 8
BUN: 12 mg/dL (ref 6–23)
Calcium, Ion: 1.26 mmol/L (ref 1.13–1.30)
Chloride: 102 mEq/L (ref 96–112)
Creatinine, Ser: 1.1 mg/dL (ref 0.50–1.35)
Glucose, Bld: 107 mg/dL — ABNORMAL HIGH (ref 70–99)
HCT: 47 % (ref 39.0–52.0)
HEMOGLOBIN: 16 g/dL (ref 13.0–17.0)
Potassium: 4.2 mEq/L (ref 3.7–5.3)
SODIUM: 143 meq/L (ref 137–147)
TCO2: 24 mmol/L (ref 0–100)

## 2013-12-21 SURGERY — TRANSURETHRAL RESECTION OF THE PROSTATE WITH GYRUS INSTRUMENTS
Anesthesia: General | Site: Prostate

## 2013-12-21 MED ORDER — FENTANYL CITRATE 0.05 MG/ML IJ SOLN
INTRAMUSCULAR | Status: AC
  Filled 2013-12-21: qty 4

## 2013-12-21 MED ORDER — DOCUSATE SODIUM 100 MG PO CAPS
100.0000 mg | ORAL_CAPSULE | Freq: Two times a day (BID) | ORAL | Status: DC
  Administered 2013-12-22 – 2013-12-24 (×5): 100 mg via ORAL
  Filled 2013-12-21 (×8): qty 1

## 2013-12-21 MED ORDER — LACTATED RINGERS IV SOLN
INTRAVENOUS | Status: DC
  Administered 2013-12-21: 07:00:00 via INTRAVENOUS
  Filled 2013-12-21: qty 1000

## 2013-12-21 MED ORDER — SODIUM CHLORIDE 0.45 % IV SOLN
INTRAVENOUS | Status: DC
  Administered 2013-12-21 – 2013-12-23 (×3): via INTRAVENOUS
  Administered 2013-12-23: 75 mL/h via INTRAVENOUS
  Filled 2013-12-21: qty 1000

## 2013-12-21 MED ORDER — DEXTROSE 5 % IV SOLN
3.0000 g | INTRAVENOUS | Status: AC
  Administered 2013-12-21: 3 g via INTRAVENOUS
  Filled 2013-12-21: qty 3000

## 2013-12-21 MED ORDER — MIDAZOLAM HCL 5 MG/5ML IJ SOLN
INTRAMUSCULAR | Status: DC | PRN
  Administered 2013-12-21: 1 mg via INTRAVENOUS

## 2013-12-21 MED ORDER — PANTOPRAZOLE SODIUM 40 MG PO TBEC
40.0000 mg | DELAYED_RELEASE_TABLET | Freq: Every day | ORAL | Status: DC
  Administered 2013-12-22 – 2013-12-24 (×2): 40 mg via ORAL
  Filled 2013-12-21 (×5): qty 1

## 2013-12-21 MED ORDER — OXYBUTYNIN CHLORIDE 5 MG PO TABS
5.0000 mg | ORAL_TABLET | Freq: Three times a day (TID) | ORAL | Status: DC | PRN
  Administered 2013-12-21: 5 mg via ORAL
  Filled 2013-12-21: qty 1

## 2013-12-21 MED ORDER — FENTANYL CITRATE 0.05 MG/ML IJ SOLN
INTRAMUSCULAR | Status: DC | PRN
  Administered 2013-12-21: 50 ug via INTRAVENOUS
  Administered 2013-12-21: 25 ug via INTRAVENOUS

## 2013-12-21 MED ORDER — DEXAMETHASONE SODIUM PHOSPHATE 4 MG/ML IJ SOLN
INTRAMUSCULAR | Status: DC | PRN
  Administered 2013-12-21: 10 mg via INTRAVENOUS

## 2013-12-21 MED ORDER — SODIUM CHLORIDE 0.9 % IR SOLN
Status: DC | PRN
  Administered 2013-12-21: 30000 mL via INTRAVESICAL

## 2013-12-21 MED ORDER — CEFAZOLIN SODIUM 1-5 GM-% IV SOLN
1.0000 g | INTRAVENOUS | Status: DC
  Filled 2013-12-21: qty 50

## 2013-12-21 MED ORDER — BELLADONNA ALKALOIDS-OPIUM 16.2-60 MG RE SUPP
RECTAL | Status: AC
  Filled 2013-12-21: qty 1

## 2013-12-21 MED ORDER — IRBESARTAN 300 MG PO TABS
300.0000 mg | ORAL_TABLET | Freq: Every day | ORAL | Status: DC
  Administered 2013-12-22 – 2013-12-24 (×3): 300 mg via ORAL
  Filled 2013-12-21 (×5): qty 1

## 2013-12-21 MED ORDER — PROPOFOL 10 MG/ML IV BOLUS
INTRAVENOUS | Status: DC | PRN
  Administered 2013-12-21: 200 mg via INTRAVENOUS

## 2013-12-21 MED ORDER — LIDOCAINE HCL (CARDIAC) 20 MG/ML IV SOLN
INTRAVENOUS | Status: DC | PRN
  Administered 2013-12-21: 100 mg via INTRAVENOUS

## 2013-12-21 MED ORDER — HYDROCHLOROTHIAZIDE 12.5 MG PO CAPS
12.5000 mg | ORAL_CAPSULE | Freq: Every day | ORAL | Status: DC
  Administered 2013-12-24: 12.5 mg via ORAL
  Filled 2013-12-21 (×4): qty 1

## 2013-12-21 MED ORDER — AMLODIPINE BESYLATE 10 MG PO TABS
10.0000 mg | ORAL_TABLET | Freq: Every day | ORAL | Status: DC
  Administered 2013-12-24: 10 mg via ORAL
  Filled 2013-12-21 (×4): qty 1

## 2013-12-21 MED ORDER — OXYCODONE HCL 5 MG PO TABS
5.0000 mg | ORAL_TABLET | ORAL | Status: DC | PRN
  Administered 2013-12-21 – 2013-12-24 (×7): 5 mg via ORAL
  Filled 2013-12-21 (×7): qty 1

## 2013-12-21 MED ORDER — MECLIZINE HCL 25 MG PO TABS
50.0000 mg | ORAL_TABLET | Freq: Three times a day (TID) | ORAL | Status: DC | PRN
  Filled 2013-12-21: qty 2

## 2013-12-21 MED ORDER — HYDROMORPHONE HCL PF 1 MG/ML IJ SOLN
0.2500 mg | INTRAMUSCULAR | Status: DC | PRN
  Filled 2013-12-21: qty 1

## 2013-12-21 MED ORDER — METOPROLOL SUCCINATE ER 25 MG PO TB24
25.0000 mg | ORAL_TABLET | Freq: Every day | ORAL | Status: DC
  Administered 2013-12-24: 25 mg via ORAL
  Filled 2013-12-21 (×4): qty 1

## 2013-12-21 MED ORDER — ONDANSETRON HCL 4 MG/2ML IJ SOLN
INTRAMUSCULAR | Status: DC | PRN
  Administered 2013-12-21: 4 mg via INTRAVENOUS

## 2013-12-21 MED ORDER — ONDANSETRON HCL 4 MG/2ML IJ SOLN
4.0000 mg | INTRAMUSCULAR | Status: DC | PRN
  Administered 2013-12-22: 4 mg via INTRAVENOUS
  Filled 2013-12-21 (×2): qty 2

## 2013-12-21 MED ORDER — PROMETHAZINE HCL 25 MG/ML IJ SOLN
6.2500 mg | INTRAMUSCULAR | Status: DC | PRN
  Filled 2013-12-21: qty 1

## 2013-12-21 MED ORDER — OXYBUTYNIN CHLORIDE 5 MG PO TABS
ORAL_TABLET | ORAL | Status: AC
  Filled 2013-12-21: qty 1

## 2013-12-21 MED ORDER — ATORVASTATIN CALCIUM 80 MG PO TABS
80.0000 mg | ORAL_TABLET | Freq: Every day | ORAL | Status: DC
  Administered 2013-12-23 – 2013-12-24 (×2): 80 mg via ORAL
  Filled 2013-12-21 (×4): qty 1

## 2013-12-21 MED ORDER — LACTATED RINGERS IV SOLN
INTRAVENOUS | Status: DC
  Administered 2013-12-21: 13:00:00 via INTRAVENOUS
  Filled 2013-12-21: qty 1000

## 2013-12-21 MED ORDER — MIDAZOLAM HCL 2 MG/2ML IJ SOLN
INTRAMUSCULAR | Status: AC
  Filled 2013-12-21: qty 2

## 2013-12-21 MED ORDER — EPHEDRINE SULFATE 50 MG/ML IJ SOLN
INTRAMUSCULAR | Status: DC | PRN
  Administered 2013-12-21 (×5): 10 mg via INTRAVENOUS

## 2013-12-21 MED ORDER — OXYCODONE HCL 5 MG PO TABS
ORAL_TABLET | ORAL | Status: AC
  Filled 2013-12-21: qty 1

## 2013-12-21 MED ORDER — ZOLPIDEM TARTRATE 5 MG PO TABS
ORAL_TABLET | ORAL | Status: AC
  Filled 2013-12-21: qty 1

## 2013-12-21 MED ORDER — ZOLPIDEM TARTRATE 5 MG PO TABS
5.0000 mg | ORAL_TABLET | Freq: Every evening | ORAL | Status: DC | PRN
  Administered 2013-12-21 – 2013-12-24 (×4): 5 mg via ORAL
  Filled 2013-12-21 (×4): qty 1

## 2013-12-21 SURGICAL SUPPLY — 38 items
BAG DRAIN URO-CYSTO SKYTR STRL (DRAIN) ×3 IMPLANT
BAG DRN ANRFLXCHMBR STRAP LEK (BAG)
BAG DRN UROCATH (DRAIN) ×1
BAG URINE DRAINAGE (UROLOGICAL SUPPLIES) ×2 IMPLANT
BAG URINE LEG 19OZ MD ST LTX (BAG) IMPLANT
CANISTER SUCT LVC 12 LTR MEDI- (MISCELLANEOUS) ×6 IMPLANT
CATH FOLEY 2WAY SLVR  5CC 20FR (CATHETERS)
CATH FOLEY 2WAY SLVR  5CC 22FR (CATHETERS)
CATH FOLEY 2WAY SLVR 30CC 22FR (CATHETERS) ×3 IMPLANT
CATH FOLEY 2WAY SLVR 5CC 20FR (CATHETERS) IMPLANT
CATH FOLEY 2WAY SLVR 5CC 22FR (CATHETERS) IMPLANT
CATH FOLEY 3WAY 30CC 22F (CATHETERS) IMPLANT
CATH HEMA 3WAY 30CC 24FR COUDE (CATHETERS) IMPLANT
CATH HEMA 3WAY 30CC 24FR RND (CATHETERS) IMPLANT
CLOTH BEACON ORANGE TIMEOUT ST (SAFETY) ×3 IMPLANT
DRAPE CAMERA CLOSED 9X96 (DRAPES) ×3 IMPLANT
ELECT BUTTON HF 24-28F 2 30DE (ELECTRODE) ×3 IMPLANT
ELECT LOOP MED HF 24F 12D CBL (CLIP) ×2 IMPLANT
ELECT REM PT RETURN 9FT ADLT (ELECTROSURGICAL) ×3
ELECTRODE REM PT RTRN 9FT ADLT (ELECTROSURGICAL) ×1 IMPLANT
EVACUATOR MICROVAS BLADDER (UROLOGICAL SUPPLIES) ×3 IMPLANT
GLOVE BIO SURGEON STRL SZ 6.5 (GLOVE) ×1 IMPLANT
GLOVE BIO SURGEON STRL SZ8 (GLOVE) IMPLANT
GLOVE BIO SURGEONS STRL SZ 6.5 (GLOVE) ×1
GLOVE INDICATOR 7.0 STRL GRN (GLOVE) ×4 IMPLANT
GOWN PREVENTION PLUS LG XLONG (DISPOSABLE) ×1 IMPLANT
GOWN STRL REIN XL XLG (GOWN DISPOSABLE) ×1 IMPLANT
GOWN STRL REUS W/ TWL LRG LVL3 (GOWN DISPOSABLE) IMPLANT
GOWN STRL REUS W/TWL LRG LVL3 (GOWN DISPOSABLE) ×3
GOWN STRL REUS W/TWL XL LVL3 (GOWN DISPOSABLE) ×2 IMPLANT
HOLDER FOLEY CATH W/STRAP (MISCELLANEOUS) ×2 IMPLANT
IV NS IRRIG 3000ML ARTHROMATIC (IV SOLUTION) ×22 IMPLANT
NS IRRIG 500ML POUR BTL (IV SOLUTION) ×8 IMPLANT
PACK CYSTOSCOPY (CUSTOM PROCEDURE TRAY) ×3 IMPLANT
PLUG CATH AND CAP STER (CATHETERS) ×2 IMPLANT
SET ASPIRATION TUBING (TUBING) ×2 IMPLANT
SYR 30ML LL (SYRINGE) IMPLANT
SYRINGE IRR TOOMEY STRL 70CC (SYRINGE) IMPLANT

## 2013-12-21 NOTE — H&P (Signed)
Urology History and Physical Exam  CC: Large prostate  HPI: 73 year old male presents for elective TUR-P. He has significant voiding symptoms despite maximal medical therapy. We have discussed further intervention, discussing alternatives. We have chosen a TUR-P with the possibility of a staged procedure due to the large size of his prostate.  PMH: Past Medical History  Diagnosis Date  . Arthritis   . GERD (gastroesophageal reflux disease)   . Hypertension   . Degenerative joint disease   . H/O hiatal hernia   . Hyperlipidemia   . BPH (benign prostatic hypertrophy)   . Status post dilation of esophageal narrowing   . History of atrial flutter     REMOTE HX ON TREADMILL  . S/P drug eluting coronary stent placement     11-26-2001   X2 DES  TO LAD &  BMS  . CAD (coronary artery disease)     a. Lexiscan Myoview (11/2013):  Normal stress nuclear study.  LV Ejection Fraction: 65%  . History of colon polyps   . Schatzki's ring   . Lower urinary tract symptoms (LUTS)   . RBBB (right bundle branch block)   . IBS (irritable bowel syndrome)   . Complication of anesthesia     hx PONV    PSH: Past Surgical History  Procedure Laterality Date  . Cholecystectomy    . Colonoscopy  05/29/2011    RMR: tubular adenoma  . Esophagogastroduodenoscopy  04/05/2004    RMR: Small hiatal hernia/Questionable subtle Schatzki's ring, status post dilation as described  . Esophagogastroduodenoscopy  05/2011    RMR: small hiatal hernia, empiric dilation with 38F and 55F Maloney  . Esophagogastroduodenoscopy (egd) with esophageal dilation N/A 01/16/2013    Dr. Rourk:baggy somewhat atonic esophagus-query underlying esophageal motility disorder. Status post passage of a Maloney dilator (large bore) empirically. Status post esophageal biopsy/Reflux symptoms are well controlled on Nexium.benign path  . Appendectomy    . Total knee arthroplasty Left 07-27-2002  . Total hip arthroplasty Left 09-07-2003  .  Cardiovascular stress test  12-09-2013    NUCLEAR LEXISCAN STUDY--  RESULTS PENDING  . Cardiovascular stress test  04/2011  DR CRENSHAW    NORMAL /  NO ISCHEMIA/  EF 67%  . Coronary angioplasty with stent placement  11-26-2001  DR Darnell Level BRODIE    DES (Cypher) to LAD 70%/  BMS (Express) to OSTIAL D1 80%/   CFX  &  RCA normal/  EF 65%    Allergies: Allergies  Allergen Reactions  . Codeine Nausea And Vomiting    Medications: No prescriptions prior to admission     Social History: History   Social History  . Marital Status: Married    Spouse Name: N/A    Number of Children: N/A  . Years of Education: N/A   Occupational History  . Not on file.   Social History Main Topics  . Smoking status: Never Smoker   . Smokeless tobacco: Not on file  . Alcohol Use: 0.0 oz/week     Comment: on holidays  . Drug Use: No  . Sexual Activity: Not on file   Other Topics Concern  . Not on file   Social History Narrative   Retired from [employer]    Family History: Family History  Problem Relation Age of Onset  . Hypertension Mother   . Coronary artery disease Mother   . Kidney failure Mother   . Cancer Father     Lung  . Cancer Sister  breast carcinoma  . Hypertension Sister   . Colon cancer Neg Hx     Review of Systems: Positive: LUTS Negative:  A further 10 point review of systems was negative except what is listed in the HPI.                  Physical Exam: @VITALS2 @ General: No acute distress.  Awake. Head:  Normocephalic.  Atraumatic. ENT:  EOMI.  Mucous membranes moist Neck:  Supple.  No lymphadenopathy. CV:  S1 present. S2 present. Regular rate. Pulmonary: Equal effort bilaterally.  Clear to auscultation bilaterally. Abdomen: Soft.  Non tender to palpation. Skin:  Normal turgor.  No visible rash. Extremity: No gross deformity of bilateral upper extremities.  No gross deformity of                             lower extremities. Neurologic: Alert.  Appropriate mood.    Studies:  No results found for this basename: HGB, WBC, PLT,  in the last 72 hours  No results found for this basename: NA, K, CL, CO2, BUN, CREATININE, CALCIUM, MAGNESIUM, GFRNONAA, GFRAA,  in the last 72 hours   No results found for this basename: PT, INR, APTT,  in the last 72 hours   No components found with this basename: ABG,     Assessment:  BPH with a very large prostate  Plan: TUR-P, possibly a staged procedure.

## 2013-12-21 NOTE — Transfer of Care (Signed)
Immediate Anesthesia Transfer of Care Note  Patient: Gregory Wilson  Procedure(s) Performed: Procedure(s) (LRB): TRANSURETHRAL RESECTION OF THE PROSTATE WITH GYRUS INSTRUMENTS (N/A)  Patient Location: PACU  Anesthesia Type: General  Level of Consciousness: awake, oriented, sedated and patient cooperative  Airway & Oxygen Therapy: Patient Spontanous Breathing and Patient connected to face mask oxygen  Post-op Assessment: Report given to PACU RN and Post -op Vital signs reviewed and stable  Post vital signs: Reviewed and stable  Complications: No apparent anesthesia complications

## 2013-12-21 NOTE — Progress Notes (Signed)
Patient called RN to room,stated he was feeling a lot of pressure in his bladder. Foley hand irrigated X3 by Meriel Pica.patient stated pressure feeling relieved red/pink urine return in foley bag. Patient tolerated well.Dr Diona Fanti notified no new orders received. Will continue to monitor patient.C. Foecking,RN

## 2013-12-21 NOTE — Anesthesia Procedure Notes (Signed)
Procedure Name: LMA Insertion Date/Time: 12/21/2013 8:58 AM Performed by: Denna Haggard D Pre-anesthesia Checklist: Patient identified, Emergency Drugs available, Suction available and Patient being monitored Patient Re-evaluated:Patient Re-evaluated prior to inductionOxygen Delivery Method: Circle System Utilized Preoxygenation: Pre-oxygenation with 100% oxygen Intubation Type: IV induction Ventilation: Mask ventilation without difficulty LMA: LMA inserted LMA Size: 5.0 Number of attempts: 1 Airway Equipment and Method: bite block Placement Confirmation: positive ETCO2 Tube secured with: Tape Dental Injury: Teeth and Oropharynx as per pre-operative assessment

## 2013-12-21 NOTE — Anesthesia Preprocedure Evaluation (Addendum)
Anesthesia Evaluation  Patient identified by MRN, date of birth, ID band Patient awake    Reviewed: Allergy & Precautions, H&P , NPO status , Patient's Chart, lab work & pertinent test results  History of Anesthesia Complications (+) PONV  Airway Mallampati: III TM Distance: >3 FB Neck ROM: Full    Dental  (+) Teeth Intact, Dental Advisory Given   Pulmonary shortness of breath,  breath sounds clear to auscultation  Pulmonary exam normal       Cardiovascular hypertension, Pt. on medications + CAD and + Cardiac Stents + dysrhythmias Rhythm:Regular Rate:Normal     Neuro/Psych negative neurological ROS  negative psych ROS   GI/Hepatic Neg liver ROS, hiatal hernia, GERD-  Medicated,  Endo/Other  Morbid obesity  Renal/GU negative Renal ROS  negative genitourinary   Musculoskeletal  (+) Arthritis -,   Abdominal (+) + obese,   Peds  Hematology negative hematology ROS (+)   Anesthesia Other Findings   Reproductive/Obstetrics                          Anesthesia Physical Anesthesia Plan  ASA: III  Anesthesia Plan: General   Post-op Pain Management:    Induction: Intravenous  Airway Management Planned: LMA  Additional Equipment:   Intra-op Plan:   Post-operative Plan: Extubation in OR  Informed Consent: I have reviewed the patients History and Physical, chart, labs and discussed the procedure including the risks, benefits and alternatives for the proposed anesthesia with the patient or authorized representative who has indicated his/her understanding and acceptance.   Dental advisory given  Plan Discussed with: CRNA  Anesthesia Plan Comments:         Anesthesia Quick Evaluation

## 2013-12-21 NOTE — Anesthesia Postprocedure Evaluation (Signed)
Anesthesia Post Note  Patient: Gregory Wilson  Procedure(s) Performed: Procedure(s) (LRB): TRANSURETHRAL RESECTION OF THE PROSTATE WITH GYRUS INSTRUMENTS (N/A)  Anesthesia type: General  Patient location: PACU  Post pain: Pain level controlled  Post assessment: Post-op Vital signs reviewed  Last Vitals:  Filed Vitals:   12/21/13 1130  BP: 109/74  Pulse: 64  Temp:   Resp: 9    Post vital signs: Reviewed  Level of consciousness: sedated  Complications: No apparent anesthesia complications

## 2013-12-21 NOTE — Op Note (Signed)
Preoperative diagnosis: 1. Bladder outlet obstruction secondary to BPH  Postoperative diagnosis:  1. Bladder outlet obstruction secondary to BPH  Procedure:  1. Cystoscopy 2. Transurethral resection of the prostate  Surgeon: Lillette Boxer. Dahlstedt, M.D.  Anesthesia: General  Complications: None  Drain: Foley catheter  EBL: Minimal  Specimens: 1. Prostate chips  Disposition of specimens: Pathology  Indication: Gregory Wilson is a patient with bladder outlet obstruction secondary to benign prostatic hyperplasia. After reviewing the management options for treatment, he elected to proceed with the above surgical procedure(s). We have discussed the potential benefits and risks of the procedure, side effects of the proposed treatment, the likelihood of the patient achieving the goals of the procedure, and any potential problems that might occur during the procedure or recuperation. Informed consent has been obtained.  Description of procedure:  The patient was identified in the holding area.He received preoperative antibiotics. He was then taken to the operating room. General anesthetic was administered.  The patient was then placed in the dorsal lithotomy position, prepped and draped in the usual sterile fashion. Timeout was then performed.  A resectoscope sheath was placed using the obturator, and the resectoscope, loop and telescope were placed.  The bladder was then systematically examined in its entirety. There was no evidence of  tumors, stones, or other mucosal pathology.  The ureteral orifices were identified and marked so as to be avoided during the procedure.  The prostate adenoma was then resected utilizing loop cautery resection with the monopolar/bipolar cutting loop.  The prostate adenoma from the bladder neck back to the verumontanum was resected beginning at the six o'clock position and then extended to include the right and left lobes of the prostate and anterior  prostate, respectively. Care was taken not to resect distal to the verumontanum.  Hemostasis was then achieved with the cautery and the bladder was emptied and reinspected with no significant bleeding noted at the end of the procedure.  Resected chips were irrigated from the bladder with the evacuator and sent to pathology.  A  22 Fr 3 way catheter was then placed into the bladder and placed on continuous bladder irrigation.  The patient appeared to tolerate the procedure well and without complications. The patient was able to be awakened and transferred to the recovery unit in satisfactory condition. He tolerated the procedure well.

## 2013-12-22 ENCOUNTER — Encounter (HOSPITAL_BASED_OUTPATIENT_CLINIC_OR_DEPARTMENT_OTHER): Payer: Self-pay | Admitting: Urology

## 2013-12-22 MED ORDER — OXYCODONE-ACETAMINOPHEN 5-325 MG PO TABS: 1.0000 | ORAL_TABLET | ORAL | Status: DC | PRN

## 2013-12-22 MED ORDER — DSS 100 MG PO CAPS: 100.0000 mg | ORAL_CAPSULE | Freq: Two times a day (BID) | ORAL | Status: DC

## 2013-12-22 MED ORDER — SULFAMETHOXAZOLE-TMP DS 800-160 MG PO TABS: 1.0000 | ORAL_TABLET | Freq: Two times a day (BID) | ORAL | Status: DC

## 2013-12-22 NOTE — Progress Notes (Signed)
Hematuria 73fr catheter placed and CBI started per dr Dahlstedt's order. Red bloody urine returned with clots noted. Pt tolerated well. Will continue to monitor. C.Foecking,RN

## 2013-12-22 NOTE — Progress Notes (Signed)
Pt's foley catheter and CBI discontinued per order.  Pt's urine light pink w/o signs of blood clots in urine.  Pt tolerated procedure well.  Encouraged po intake.  No c/o pain

## 2013-12-22 NOTE — Progress Notes (Signed)
Patient was able to void 100cc of bloody urine, no clots noted. Will continue to monitor patient. CLexine Baton

## 2013-12-22 NOTE — Progress Notes (Signed)
Report called to [facility].

## 2013-12-22 NOTE — Progress Notes (Signed)
Patient C/O pressure,feeling like he needed to urinate but was unable. Patient bladder scan showing only 87cc in bladder at this point. Will continue to monitor patient. C.Foecking,RN

## 2013-12-22 NOTE — Progress Notes (Addendum)
Pt c/o moderate indigestion.  zofran given iv, explaining to pt that med is used for nausea but may assist w/ indigestion. Pt having trouble sleeping d/t the indigestion and stated that he wanted to try the zofran but not needing anything for pain at this point.  HOB elevated 45 degrees.  Urine in foley bag turning from dark pink at 2300 to a lighter pink w/ no clots noted.

## 2013-12-22 NOTE — Discharge Instructions (Signed)
Transurethral Resection of the Prostate ° °Care After ° °Refer to this sheet in the next few weeks. These discharge instructions provide you with general information on caring for yourself after you leave the hospital. Your caregiver may also give you specific instructions. Your treatment has been planned according to the most current medical practices available, but unavoidable complications sometimes occur. If you have any problems or questions after discharge, please call your caregiver. ° °HOME CARE INSTRUCTIONS  ° °Medications °· You may receive medicine for pain management. As your level of discomfort decreases, adjustments in your pain medicines may be made.  °· Take all medicines as directed.  °· You may be given a medicine (antibiotic) to kill germs following surgery. Finish all medicines. Let your caregiver know if you have any side effects or problems from the medicine.  °· If you are on aspirin, it would be best not to restart the aspirin until the blood in the urine clears °Hygiene °· You can take a shower after surgery.  °· You should not take a bath while you still have the urethral catheter. °Activity °· You will be encouraged to get out of bed as much as possible and increase your activity level as tolerated.  °· Spend the first week in and around your home. For 3 weeks, avoid the following:  °· Straining.  °· Running.  °· Strenuous work.  °· Walks longer than a few blocks.  °· Riding for extended periods.  °· Sexual relations.  °· Do not lift heavy objects (more than 20 pounds) for at least 1 month. When lifting, use your arms instead of your abdominal muscles.  °· You will be encouraged to walk as tolerated. Do not exert yourself. Increase your activity level slowly. Remember that it is important to keep moving after an operation of any type. This cuts down on the possibility of developing blood clots.  °· Your caregiver will tell you when you can resume driving and light housework. Discuss this  at your first office visit after discharge. °Diet °· No special diet is ordered after a TURP. However, if you are on a special diet for another medical problem, it should be continued.  °· Normal fluid intake is usually recommended.  °· Avoid alcohol and caffeinated drinks for 2 weeks. They irritate the bladder. Decaffeinated drinks are okay.  °· Avoid spicy foods.  °Bladder Function °· For the first 10 days, empty the bladder whenever you feel a definite desire. Do not try to hold the urine for long periods of time.  °· Urinating once or twice a night even after you are healed is not uncommon.  °· You may see some recurrence of blood in the urine after discharge from the hospital. This usually happens within 2 weeks after the procedure.If this occurs, force fluids again as you did in the hospital and reduce your activity.  °Bowel Function °· You may experience some constipation after surgery. This can be minimized by increasing fluids and fiber in your diet. Drink enough water and fluids to keep your urine clear or pale yellow.  °· A stool softener may be prescribed for use at home. Do not strain to move your bowels.  °· If you are requiring increased pain medicine, it is important that you take stool softeners to prevent constipation. This will help to promote proper healing by reducing the need to strain to move your bowels.  °Sexual Activity °· Semen movement in the opposite direction and into the bladder (  retrograde ejaculation) may occur. Since the semen passes into the bladder, cloudy urine can occur the first time you urinate after intercourse. Or, you may not have an ejaculation during erection. Ask your caregiver when you can resume sexual activity. Retrograde ejaculation and reduced semen discharge should not reduce one's pleasure of intercourse.  °Postoperative Visit °· Arrange the date and time of your after surgery visit with your caregiver.  °Return to Work °· After your recovery is complete, you will  be able to return to work and resume all activities. Your caregiver will inform you when you can return to work.  °Foley Catheter Care °A soft, flexible tube (Foley catheter) may have been placed in your bladder to drain urine and fluid. Follow these instructions: °Taking Care of the Catheter °· Keep the area where the catheter leaves your body clean.  °· Attach the catheter to the leg so there is no tension on the catheter.  °· Keep the drainage bag below the level of the bladder, but keep it OFF the floor.  °· Do not take long soaking baths. Your caregiver will give instructions about showering.  °· Wash your hands before touching ANYTHING related to the catheter or bag.  °· Using mild soap and warm water on a washcloth:  °· Clean the area closest to the catheter insertion site using a circular motion around the catheter.  °· Clean the catheter itself by wiping AWAY from the insertion site for several inches down the tube.  °· NEVER wipe upward as this could sweep bacteria up into the urethra (tube in your body that normally drains the bladder) and cause infection.  °· Place a small amount of sterile lubricant at the tip of the penis where the catheter is entering.  °Taking Care of the Drainage Bags °· Two drainage bags may be taken home: a large overnight drainage bag, and a smaller leg bag which fits underneath clothing.  °· It is okay to wear the overnight bag at any time, but NEVER wear the smaller leg bag at night.  °· Keep the drainage bag well below the level of your bladder. This prevents backflow of urine into the bladder and allows the urine to drain freely.  °· Anchor the tubing to your leg to prevent pulling or tension on the catheter. Use tape or a leg strap provided by the hospital.  °· Empty the drainage bag when it is 1/2 to 3/4 full. Wash your hands before and after touching the bag.  °· Periodically check the tubing for kinks to make sure there is no pressure on the tubing which could restrict  the flow of urine.  °Changing the Drainage Bags °· Cleanse both ends of the clean bag with alcohol before changing.  °· Pinch off the rubber catheter to avoid urine spillage during the disconnection.  °· Disconnect the dirty bag and connect the clean one.  °· Empty the dirty bag carefully to avoid a urine spill.  °· Attach the new bag to the leg with tape or a leg strap.  °Cleaning the Drainage Bags °· Whenever a drainage bag is disconnected, it must be cleaned quickly so it is ready for the next use.  °· Wash the bag in warm, soapy water.  °· Rinse the bag thoroughly with warm water.  °· Soak the bag for 30 minutes in a solution of white vinegar and water (1 cup vinegar to 1 quart warm water).  °· Rinse with warm water.  °SEEK MEDICAL   CARE IF:  °· You have chills or night sweats.  °· You are leaking around your catheter or have problems with your catheter. It is not uncommon to have sporadic leakage around your catheter as a result of bladder spasms. If the leakage stops, there is not much need for concern. If you are uncertain, call your caregiver.  °· You develop side effects that you think are coming from your medicines.  °SEEK IMMEDIATE MEDICAL CARE IF:  °· You are suddenly unable to urinate. Check to see if there are any kinks in the drainage tubing that may cause this. If you cannot find any kinks, call your caregiver immediately. This is an emergency.  °· You develop shortness of breath or chest pains.  °· Bleeding persists or clots develop in your urine.  °· You have a fever.  °· You develop pain in your back or over your lower belly (abdomen).  °· You develop pain or swelling in your legs.  °· Any problems you are having get worse rather than better.  °MAKE SURE YOU:  °· Understand these instructions.  °· Will watch your condition.  °· Will get help right away if you are not doing well or get worse.  °Document Released: 06/18/2005 Document Revised: 02/28/2011 Document Reviewed: 02/09/2009 °[employer]®  Patient Information ©2012 [employer].Transurethral Resection of the Prostate °Care After °Refer to this sheet in the next few weeks. These discharge instructions provide you with general information on caring for yourself after you leave the hospital. Your caregiver may also give you specific instructions. Your treatment has been planned according to the most current medical practices available, but unavoidable complications sometimes occur. If you have any problems or questions after discharge, please call your caregiver. °

## 2013-12-23 MED ORDER — FLEET ENEMA 7-19 GM/118ML RE ENEM
1.0000 | ENEMA | Freq: Every day | RECTAL | Status: DC | PRN
  Administered 2013-12-23: 1 via RECTAL
  Filled 2013-12-23: qty 1

## 2013-12-23 NOTE — Progress Notes (Signed)
2 Days Post-Op Subjective: Patient reports that he slept better last pm. He was admitted due to clots/incomplete bladder emptying.  Objective: Vital signs in last 24 hours: Temp:  [97.7 F (36.5 C)-98.3 F (36.8 C)] 97.7 F (36.5 C) (06/24 0618) Pulse Rate:  [64-88] 64 (06/24 0618) Resp:  [20] 20 (06/24 0618) BP: (120-138)/(68-84) 134/84 mmHg (06/24 0618) SpO2:  [99 %-100 %] 100 % (06/24 0618)  Intake/Output from previous day: 06/23 0701 - 06/24 0700 In: 66294 [P.O.:480; I.V.:1275] Out: 14300 [Urine:14300] Intake/Output this shift: Total I/O In: 6615 [P.O.:240; I.V.:375; Other:6000] Out: 6800 [Urine:6800]  Physical Exam:  Constitutional: Vital signs reviewed. WD WN in NAD   Eyes: PERRL, No scleral icterus.   Pulmonary/Chest: Normal effort Extremities: No cyanosis or edema   Bladder irrigated free of clots by hand--perhaps 50 cc of clots evacuated. Lab Results:  Recent Labs  12/21/13 0727  HGB 16.0  HCT 47.0   BMET  Recent Labs  12/21/13 0727  NA 143  K 4.2  CL 102  GLUCOSE 107*  BUN 12  CREATININE 1.10   No results found for this basename: LABPT, INR,  in the last 72 hours No results found for this basename: LABURIN,  in the last 72 hours No results found for this or any previous visit.  Studies/Results: No results found.  Assessment/Plan:   POD 2 TUR-P. Still needing CBI.  Will hopefully be able to d/c cath 6/25.   LOS: 2 days   Franchot Gallo M 12/23/2013, 6:50 AM

## 2013-12-23 NOTE — Care Management Note (Signed)
    Page 1 of 1   12/23/2013     2:56:08 PM CARE MANAGEMENT NOTE 12/23/2013  Patient:  Gregory Wilson, Gregory Wilson   Account Number:  [id number]  Date Initiated:  12/23/2013  Documentation initiated by:  [facility]  Subjective/Objective Assessment:   73 Y/O M ADMITTED W/HYPERTROPHY OF PROSTATE.     Action/Plan:   FROM HOME.   Anticipated DC Date:  12/24/2013   Anticipated DC Plan:  [place]  CM consult      Choice offered to / List presented to:             Status of service:  In process, will continue to follow Medicare Important Message given?   (If response is "NO", the following Medicare IM given date fields will be blank) Date Medicare IM given:   Date Additional Medicare IM given:    Discharge Disposition:    Per UR Regulation:  Reviewed for med. necessity/level of care/duration of stay  If discussed at [place] of Stay Meetings, dates discussed:    Comments:  12/23/13 KATHY MAHABIR RN,BSN  NCM [phone] POD#2 TURP.CBI.NO ANTICIPATED D/C NEEDS.

## 2013-12-24 NOTE — Progress Notes (Signed)
3 Days Post-Op Subjective: Patient reports he slept well. No bladder pain  Objective: Vital signs in last 24 hours: Temp:  [97.5 F (36.4 C)-98.9 F (37.2 C)] 97.5 F (36.4 C) (06/25 1343) Pulse Rate:  [65-77] 73 (06/25 1343) Resp:  [18-20] 18 (06/25 1343) BP: (107-135)/(70-90) 135/78 mmHg (06/25 1343) SpO2:  [98 %-100 %] 100 % (06/25 1343)  Intake/Output from previous day: 06/24 0701 - 06/25 0700 In: 37106 [P.O.:480; I.V.:1275] Out: 26948 [Urine:14150] Intake/Output this shift: Total I/O In: 150 [P.O.:150] Out: 725 [Urine:725]  Physical Exam:  Constitutional: Vital signs reviewed. WD WN in NAD   Eyes: PERRL, No scleral icterus.   Pulmonary/Chest: Normal effort   Urine light pink  Lab Results: No results found for this basename: HGB, HCT,  in the last 72 hours BMET No results found for this basename: NA, K, CL, CO2, GLUCOSE, BUN, CREATININE, CALCIUM,  in the last 72 hours No results found for this basename: LABPT, INR,  in the last 72 hours No results found for this basename: LABURIN,  in the last 72 hours No results found for this or any previous visit.  Studies/Results: No results found.  Assessment/Plan:   POD # TUR-P w/ significant hematuria  Cath removed--if he voids well x 24 hr will d/c   LOS: 3 days   DAHLSTEDT, STEPHEN M 12/24/2013, 1:54 PM

## 2013-12-24 NOTE — Progress Notes (Signed)
Urine light pink tinged. CBI stopped per Dr. Alan Ripper request. Pt hand irrigated with 60cc of NS, no clots returned. Will continue to monitor.

## 2013-12-25 NOTE — Discharge Summary (Signed)
Patient ID: Gregory Wilson MRN: [id number] DOB/AGE: 10/05/1940 73 y.o.  Admit date: 12/21/2013 Discharge date: 12/25/2013  Primary Care Physician:  Alonza Bogus, MD  Discharge Diagnoses:   Present on Admission:  . Hypertrophy of prostate with urinary obstruction and other lower urinary tract symptoms (LUTS)  Consults:  None   Discharge Medications:   Medication List    STOP taking these medications       aspirin 325 MG tablet     finasteride 5 MG tablet  Commonly known as:  PROSCAR     FLOMAX 0.4 MG Caps capsule  Generic drug:  tamsulosin     meloxicam 7.5 MG tablet  Commonly known as:  MOBIC      TAKE these medications       amLODipine 10 MG tablet  Commonly known as:  NORVASC  Take 10 mg by mouth every morning.     atorvastatin 80 MG tablet  Commonly known as:  LIPITOR  Take 80 mg by mouth daily at 6 PM.     DSS 100 MG Caps  Take 100 mg by mouth 2 (two) times daily.     esomeprazole 40 MG capsule  Commonly known as:  NEXIUM  Take 40 mg by mouth every morning.     hydrochlorothiazide 12.5 MG capsule  Commonly known as:  MICROZIDE  Take 12.5 mg by mouth every morning.     irbesartan 300 MG tablet  Commonly known as:  AVAPRO  Take 300 mg by mouth every morning.     metoprolol succinate 25 MG 24 hr tablet  Commonly known as:  TOPROL-XL  Take 25 mg by mouth every morning.     oxyCODONE-acetaminophen 5-325 MG per tablet  Commonly known as:  ROXICET  Take 1 tablet by mouth every 4 (four) hours as needed for severe pain.     sulfamethoxazole-trimethoprim 800-160 MG per tablet  Commonly known as:  BACTRIM DS  Take 1 tablet by mouth 2 (two) times daily.         Significant Diagnostic Studies:  No results found.  Brief H and P: For complete details please refer to admission H and P, but in brief the patient is admitted for surgical management of BPH.  Hospital Course:  Active Problems:   Hypertrophy of prostate with urinary obstruction and  other lower urinary tract symptoms (LUTS) He experienced problems with clot retention after the catheter was removed necessitating 2 extra days of bladder irrigation. He eventually voided adequately following caatheter removal and was d/ced on POD 4  Day of Discharge BP 135/77  Pulse 61  Temp(Src) 98 F (36.7 C) (Oral)  Resp 16  Ht 6\' 1"  (1.854 m)  Wt 141.069 kg (311 lb)  BMI 41.04 kg/m2  SpO2 99%  No results found for this or any previous visit (from the past 24 hour(s)).  Physical Exam: General: Alert and awake oriented x3 not in any acute distress. HEENT: anicteric sclera, pupils reactive to light and accommodation CVS: S1-S2 clear no murmur rubs or gallops Chest: clear to auscultation bilaterally, no wheezing rales or rhonchi Abdomen: soft nontender, nondistended, normal bowel sounds, no organomegaly Extremities: no cyanosis, clubbing or edema noted bilaterally Neuro: Cranial nerves II-XII intact, no focal neurological deficits  Disposition:  Home  Diet:  regular  Activity:  Discussed w/ pt   Disposition and Follow-up:     Discharge Instructions   Discharge patient    Complete by:  As directed   If his urine clears. I  f blood worsens, replace the hematuria catheter using 2% lidocaine jelly and restart CBI after catheter irrigation. Let me know before d/c or if admission needed [phone]     Discharge patient    Complete by:  As directed               TESTS THAT NEED FOLLOW-UP  PAthology reviewed  DISCHARGE FOLLOW-UP Follow-up Information   Follow up with Jorja Loa, MD. (as scheduled)    Specialty:  Urology   Contact information:   [place] [id number] [phone]       Time spent on Discharge:  10 mins  Signed: Jorja Loa 12/25/2013, 7:33 AM

## 2014-01-19 ENCOUNTER — Ambulatory Visit (INDEPENDENT_AMBULATORY_CARE_PROVIDER_SITE_OTHER): Payer: Self-pay | Admitting: Urology

## 2014-01-19 DIAGNOSIS — R82998 Other abnormal findings in urine: Secondary | ICD-10-CM

## 2014-01-19 DIAGNOSIS — N401 Enlarged prostate with lower urinary tract symptoms: Secondary | ICD-10-CM

## 2014-04-01 ENCOUNTER — Other Ambulatory Visit: Payer: Self-pay | Admitting: Cardiology

## 2014-04-20 ENCOUNTER — Ambulatory Visit (INDEPENDENT_AMBULATORY_CARE_PROVIDER_SITE_OTHER): Payer: Medicare PPO | Admitting: Urology

## 2014-04-20 DIAGNOSIS — N400 Enlarged prostate without lower urinary tract symptoms: Secondary | ICD-10-CM

## 2014-06-16 ENCOUNTER — Other Ambulatory Visit: Payer: Self-pay | Admitting: Cardiology

## 2014-08-03 ENCOUNTER — Encounter: Payer: Self-pay | Admitting: Internal Medicine

## 2014-08-23 ENCOUNTER — Other Ambulatory Visit: Payer: Self-pay | Admitting: Cardiology

## 2014-08-31 ENCOUNTER — Encounter: Payer: Self-pay | Admitting: Cardiology

## 2014-08-31 ENCOUNTER — Telehealth: Payer: Self-pay | Admitting: Cardiology

## 2014-08-31 MED ORDER — METOPROLOL SUCCINATE ER 25 MG PO TB24: 25.0000 mg | ORAL_TABLET | Freq: Every day | ORAL | Status: DC

## 2014-08-31 MED ORDER — IRBESARTAN 300 MG PO TABS: 300.0000 mg | ORAL_TABLET | Freq: Every day | ORAL | Status: DC

## 2014-08-31 NOTE — Telephone Encounter (Signed)
Left message for pt to call.

## 2014-08-31 NOTE — Telephone Encounter (Signed)
This encounter was created in error - please disregard.

## 2014-08-31 NOTE — Telephone Encounter (Signed)
New Message  Pt calling to speak w/ Rn about medication. Please call back and discuss.

## 2014-08-31 NOTE — Addendum Note (Signed)
Addended by: Cristopher Estimable on: 08/31/2014 04:34 PM   Modules accepted: Level of Service, SmartSet

## 2014-10-08 ENCOUNTER — Other Ambulatory Visit: Payer: Self-pay

## 2014-10-08 ENCOUNTER — Encounter: Payer: Self-pay | Admitting: Internal Medicine

## 2014-10-08 ENCOUNTER — Ambulatory Visit (INDEPENDENT_AMBULATORY_CARE_PROVIDER_SITE_OTHER): Payer: Medicare PPO | Admitting: Internal Medicine

## 2014-10-08 VITALS — BP 164/100 | HR 69 | Temp 98.4°F | Ht 74.0 in | Wt 314.4 lb

## 2014-10-08 DIAGNOSIS — R1314 Dysphagia, pharyngoesophageal phase: Secondary | ICD-10-CM

## 2014-10-08 DIAGNOSIS — R0989 Other specified symptoms and signs involving the circulatory and respiratory systems: Secondary | ICD-10-CM

## 2014-10-08 DIAGNOSIS — D126 Benign neoplasm of colon, unspecified: Secondary | ICD-10-CM | POA: Diagnosis not present

## 2014-10-08 DIAGNOSIS — K219 Gastro-esophageal reflux disease without esophagitis: Secondary | ICD-10-CM

## 2014-10-08 NOTE — Progress Notes (Signed)
Primary Care Physician:  Alonza Bogus, MD Primary Gastroenterologist:  Dr. Gala Romney  Pre-Procedure History & Physical: HPI:  Gregory Wilson is a 74 y.o. male here for followup of GERD and dysphagia. Esophageal dysphagia has improved although patient feels he has a persistent "lump" in the am base of his neck. He is able to eat regular food. Feels there is an not there from time to time. Reflux symptoms well controlled on Nexium 40 mg daily. If he misses even a single dose he has a flare. No bowel pain. Some alternate dictation diarrhea. More constipation recently. No rectal bleeding. History of tubular adenoma; Due for surveillance colonoscopy 2017;    Past Medical History  Diagnosis Date  . Arthritis   . GERD (gastroesophageal reflux disease)   . Hypertension   . Degenerative joint disease   . H/O hiatal hernia   . Hyperlipidemia   . BPH (benign prostatic hypertrophy)   . Status post dilation of esophageal narrowing   . History of atrial flutter     REMOTE HX ON TREADMILL  . S/P drug eluting coronary stent placement     11-26-2001   X2 DES  TO LAD &  BMS  . CAD (coronary artery disease)     a. Lexiscan Myoview (11/2013):  Normal stress nuclear study.  LV Ejection Fraction: 65%  . History of colon polyps   . Schatzki's ring   . Lower urinary tract symptoms (LUTS)   . RBBB (right bundle branch block)   . IBS (irritable bowel syndrome)   . Complication of anesthesia     hx PONV    Past Surgical History  Procedure Laterality Date  . Cholecystectomy    . Colonoscopy  05/29/2011    RMR: tubular adenoma  . Esophagogastroduodenoscopy  04/05/2004    RMR: Small hiatal hernia/Questionable subtle Schatzki's ring, status post dilation as described  . Esophagogastroduodenoscopy  05/2011    RMR: small hiatal hernia, empiric dilation with 58F and 35F Maloney  . Esophagogastroduodenoscopy (egd) with esophageal dilation N/A 01/16/2013    Dr. Rourk:baggy somewhat atonic esophagus-query  underlying esophageal motility disorder. Status post passage of a Maloney dilator (large bore) empirically. Status post esophageal biopsy/Reflux symptoms are well controlled on Nexium.benign path  . Appendectomy    . Total knee arthroplasty Left 07-27-2002  . Total hip arthroplasty Left 09-07-2003  . Cardiovascular stress test  12-09-2013    NUCLEAR LEXISCAN STUDY--  RESULTS PENDING  . Cardiovascular stress test  04/2011  DR CRENSHAW    NORMAL /  NO ISCHEMIA/  EF 67%  . Coronary angioplasty with stent placement  11-26-2001  DR Darnell Level BRODIE    DES ([facility]) to LAD 70%/  BMS (Express) to OSTIAL D1 80%/   CFX  &  RCA normal/  EF 65%  . Transurethral resection of prostate N/A 12/21/2013    Procedure: TRANSURETHRAL RESECTION OF THE PROSTATE WITH GYRUS INSTRUMENTS;  Surgeon: Jorja Loa, MD;  Location[facility];  Service: Urology;  Laterality: N/A;    Prior to Admission medications   Medication Sig Start Date End Date Taking? Authorizing Provider  amLODipine (NORVASC) 10 MG tablet Take 10 mg by mouth every morning.   Yes Historical Provider, MD  aspirin 325 MG EC tablet Take 325 mg by mouth daily.   Yes Historical Provider, MD  atorvastatin (LIPITOR) 80 MG tablet Take 80 mg by mouth daily at 6 PM.   Yes Historical Provider, MD  esomeprazole (NEXIUM) 40 MG capsule  Take 40 mg by mouth every morning.   Yes Historical Provider, MD  hydrochlorothiazide (MICROZIDE) 12.5 MG capsule Take 12.5 mg by mouth every morning.   Yes Historical Provider, MD  meloxicam (MOBIC) 7.5 MG tablet  09/16/14  Yes Historical Provider, MD  metoprolol succinate (TOPROL-XL) 25 MG 24 hr tablet Take 1 tablet (25 mg total) by mouth daily. 08/31/14  Yes Lelon Perla, MD  oxyCODONE-acetaminophen (ROXICET) 5-325 MG per tablet Take 1 tablet by mouth every 4 (four) hours as needed for severe pain. 12/22/13  Yes Franchot Gallo, MD  docusate sodium 100 MG CAPS Take 100 mg by mouth 2 (two) times daily. Patient  not taking: Reported on 10/08/2014 12/22/13   Franchot Gallo, MD  irbesartan (AVAPRO) 300 MG tablet Take 1 tablet (300 mg total) by mouth daily. Patient not taking: Reported on 10/08/2014 08/31/14   Lelon Perla, MD  sulfamethoxazole-trimethoprim (BACTRIM DS) 800-160 MG per tablet Take 1 tablet by mouth 2 (two) times daily. Patient not taking: Reported on 10/08/2014 12/22/13   Franchot Gallo, MD    Allergies as of 10/08/2014 - Review Complete 10/08/2014  Allergen Reaction Noted  . Codeine Nausea And Vomiting 10/28/2008    Family History  Problem Relation Age of Onset  . Hypertension Mother   . Coronary artery disease Mother   . Kidney failure Mother   . Cancer Father     Lung  . Cancer Sister     breast carcinoma  . Hypertension Sister   . Colon cancer Neg Hx     History   Social History  . Marital Status: Married    Spouse Name: N/A  . Number of Children: N/A  . Years of Education: N/A   Occupational History  . Not on file.   Social History Main Topics  . Smoking status: Never Smoker   . Smokeless tobacco: Never Used  . Alcohol Use: 0.0 oz/week     Comment: on holidays  . Drug Use: No  . Sexual Activity: No   Other Topics Concern  . Not on file   Social History Narrative   Retired from [employer]: See HPI, otherwise negative ROS  Physical Exam: BP 164/100 mmHg  Pulse 69  Temp(Src) 98.4 F (36.9 C) (Oral)  Ht 6\' 2"  (1.88 m)  Wt 314 lb 6.4 oz (142.611 kg)  BMI 40.35 kg/m2 General:   Alert,  Well-developed, well-nourished, pleasant and cooperative in NAD Skin:  Intact without significant lesions or rashes. Eyes:  Sclera clear, no icterus.   Conjunctiva pink. Ears:  Normal auditory acuity. Nose:  No deformity, discharge,  or lesions. Mouth:  No deformity or lesions. Neck:  Supple; no masses or thyromegaly. No significant cervical adenopathy. Lungs:  Clear throughout to auscultation.   No wheezes, crackles, or rhonchi.  No acute distress. Heart:  Regular rate and rhythm; no murmurs, clicks, rubs,  or gallops. Abdomen: Non-distended, normal bowel sounds.  Soft and nontender without appreciable mass or hepatosplenomegaly.  Pulses:  Normal pulses noted. Extremities:  Without clubbing or edema.  Impression: pleasant 74 year old gentleman who appears somewhat younger than his stated chronological age. GERD symptoms well controlled but dependent on PPI therapy daily. Describes a globus and possible oropharyngeal dysphagia symptoms.  Alternate constipation diarrhea most likely irritable bowel syndrome. History of tubular adenoma.     Recommendations:   Barium pill esophogram - dysphagia /globus sensation  Continue Nexium 40 mg daily  Benefiber 2 teaspoons twice daily  See Dr. Luan Pulling about elevated blood pressure  Office visit to set up a colonoscopy in 1 year  Further recommendations to follow      Notice: This dictation was prepared with Dragon dictation along with smaller phrase technology. Any transcriptional errors that result from this process are unintentional and may not be corrected upon review.

## 2014-10-08 NOTE — Patient Instructions (Signed)
Barium pill esophogram - dysphagia /globus sensation  Continue Nexium 40 mg daily  Benefiber 2 teaspoons twice daily  See Dr. Luan Pulling about elevated blood pressure  Office visit to set up a colonoscopy in 1 year  Further recommendations to follow

## 2014-10-11 ENCOUNTER — Telehealth: Payer: Self-pay

## 2014-10-11 NOTE — Telephone Encounter (Signed)
callled pt and LMOM regarding appt for BPE on 10/14/2014 @ 8:15am at [place].    No pre cert is needed for test per Susie at [facility] Call Ref. # is [id number]

## 2014-10-14 ENCOUNTER — Ambulatory Visit (HOSPITAL_COMMUNITY)
Admission: RE | Admit: 2014-10-14 | Discharge: 2014-10-14 | Disposition: A | Discharge status: Home / Self Care | Payer: Medicare PPO | Source: Ambulatory Visit | Attending: Internal Medicine | Admitting: Internal Medicine

## 2014-10-14 DIAGNOSIS — R1314 Dysphagia, pharyngoesophageal phase: Secondary | ICD-10-CM

## 2014-10-14 DIAGNOSIS — K224 Dyskinesia of esophagus: Secondary | ICD-10-CM | POA: Diagnosis not present

## 2014-10-14 DIAGNOSIS — R0989 Other specified symptoms and signs involving the circulatory and respiratory systems: Secondary | ICD-10-CM

## 2014-10-14 DIAGNOSIS — R1319 Other dysphagia: Secondary | ICD-10-CM | POA: Insufficient documentation

## 2014-10-15 ENCOUNTER — Other Ambulatory Visit: Payer: Self-pay

## 2014-10-15 DIAGNOSIS — R1900 Intra-abdominal and pelvic swelling, mass and lump, unspecified site: Secondary | ICD-10-CM

## 2014-10-19 ENCOUNTER — Encounter: Payer: Self-pay | Admitting: Internal Medicine

## 2014-10-19 NOTE — Progress Notes (Signed)
APPOINTMENT MADE AND LETTER SENT °

## 2014-11-29 NOTE — Progress Notes (Signed)
HPI: FU CAD s/p DES to LAD and BMS to Dx in 2003, HL, HTN, AFlutter (transient on treadmill - declined coumadin). Since last seen, the patient has dyspnea with more extreme activities but not with routine activities. It is relieved with rest. It is not associated with chest pain. There is no orthopnea, PND or pedal edema. There is no syncope or palpitations. There is no exertional chest pain.    Studies: - LHC (10/2001): Mid LAD 70%, Ostial D1 80%, CFX and RCA normal, EF 65%. PCI: Cypher DES to LAD and Express BMS to Dx.  - Nuclear (6/15): No ischemia, EF 65%, Nomal  Current Outpatient Prescriptions  Medication Sig Dispense Refill  . amLODipine (NORVASC) 10 MG tablet Take 10 mg by mouth every morning.    Marland Kitchen aspirin 325 MG EC tablet Take 325 mg by mouth daily.    Marland Kitchen atorvastatin (LIPITOR) 80 MG tablet Take 80 mg by mouth daily at 6 PM.    . cloNIDine (CATAPRES) 0.1 MG tablet Take 0.1 mg by mouth daily.   0  . docusate sodium 100 MG CAPS Take 100 mg by mouth 2 (two) times daily. 30 capsule 0  . esomeprazole (NEXIUM) 40 MG capsule Take 40 mg by mouth every morning.    . hydrochlorothiazide (MICROZIDE) 12.5 MG capsule Take 12.5 mg by mouth every morning.    . irbesartan (AVAPRO) 300 MG tablet Take 1 tablet (300 mg total) by mouth daily. 90 tablet 0  . meloxicam (MOBIC) 7.5 MG tablet Take 7.5 mg by mouth daily.   1  . metoprolol succinate (TOPROL-XL) 25 MG 24 hr tablet Take 1 tablet (25 mg total) by mouth daily. 90 tablet 0  . sulfamethoxazole-trimethoprim (BACTRIM DS) 800-160 MG per tablet Take 1 tablet by mouth 2 (two) times daily. 10 tablet 0   No current facility-administered medications for this visit.     Past Medical History  Diagnosis Date  . Arthritis   . GERD (gastroesophageal reflux disease)   . Hypertension   . Degenerative joint disease   . H/O hiatal hernia   . Hyperlipidemia   . BPH (benign prostatic hypertrophy)   . Status post dilation of esophageal  narrowing   . History of atrial flutter     REMOTE HX ON TREADMILL  . S/P drug eluting coronary stent placement     11-26-2001   X2 DES  TO LAD &  BMS  . CAD (coronary artery disease)     a. Lexiscan Myoview (11/2013):  Normal stress nuclear study.  LV Ejection Fraction: 65%  . History of colon polyps   . Schatzki's ring   . Lower urinary tract symptoms (LUTS)   . RBBB (right bundle branch block)   . IBS (irritable bowel syndrome)   . Complication of anesthesia     hx PONV    Past Surgical History  Procedure Laterality Date  . Cholecystectomy    . Colonoscopy  05/29/2011    RMR: tubular adenoma  . Esophagogastroduodenoscopy  04/05/2004    RMR: Small hiatal hernia/Questionable subtle Schatzki's ring, status post dilation as described  . Esophagogastroduodenoscopy  05/2011    RMR: small hiatal hernia, empiric dilation with 74F and 32F Maloney  . Esophagogastroduodenoscopy (egd) with esophageal dilation N/A 01/16/2013    Dr. Rourk:baggy somewhat atonic esophagus-query underlying esophageal motility disorder. Status post passage of a Maloney dilator (large bore) empirically. Status post esophageal biopsy/Reflux symptoms are well controlled on Nexium.benign path  . Appendectomy    .  Total knee arthroplasty Left 07-27-2002  . Total hip arthroplasty Left 09-07-2003  . Cardiovascular stress test  12-09-2013    NUCLEAR LEXISCAN STUDY--  RESULTS PENDING  . Cardiovascular stress test  04/2011  DR Payslee Bateson    NORMAL /  NO ISCHEMIA/  EF 67%  . Coronary angioplasty with stent placement  11-26-2001  DR Darnell Level BRODIE    DES (Cypher) to LAD 70%/  BMS (Express) to OSTIAL D1 80%/   CFX  &  RCA normal/  EF 65%  . Transurethral resection of prostate N/A 12/21/2013    Procedure: TRANSURETHRAL RESECTION OF THE PROSTATE WITH GYRUS INSTRUMENTS;  Surgeon: Jorja Loa, MD;  Location: [facility];  Service: Urology;  Laterality: N/A;    History   Social History  . Marital Status:  Married    Spouse Name: N/A  . Number of Children: N/A  . Years of Education: N/A   Occupational History  . Not on file.   Social History Main Topics  . Smoking status: Never Smoker   . Smokeless tobacco: Never Used  . Alcohol Use: 0.0 oz/week     Comment: on holidays  . Drug Use: No  . Sexual Activity: No   Other Topics Concern  . Not on file   Social History Narrative   Retired from [employer]    ROS: no fevers or chills, productive cough, hemoptysis, dysphasia, odynophagia, melena, hematochezia, dysuria, hematuria, rash, seizure activity, orthopnea, PND, pedal edema, claudication. Remaining systems are negative.  Physical Exam: Well-developed well-nourished in no acute distress.  Skin is warm and dry.  HEENT is normal.  Neck is supple.  Chest is clear to auscultation with normal expansion.  Cardiovascular exam is regular rate and rhythm.  Abdominal exam nontender or distended. No masses palpated. Extremities show no edema. neuro grossly intact  ECG sinus rhythm, right bundle branch block.

## 2014-11-30 ENCOUNTER — Other Ambulatory Visit: Payer: Self-pay

## 2014-11-30 ENCOUNTER — Encounter: Payer: Self-pay | Admitting: Cardiology

## 2014-11-30 ENCOUNTER — Ambulatory Visit (INDEPENDENT_AMBULATORY_CARE_PROVIDER_SITE_OTHER): Payer: Medicare PPO | Admitting: Cardiology

## 2014-11-30 VITALS — BP 138/88 | HR 70 | Ht 73.0 in | Wt 317.0 lb

## 2014-11-30 DIAGNOSIS — E785 Hyperlipidemia, unspecified: Secondary | ICD-10-CM

## 2014-11-30 DIAGNOSIS — I10 Essential (primary) hypertension: Secondary | ICD-10-CM | POA: Diagnosis not present

## 2014-11-30 DIAGNOSIS — I2510 Atherosclerotic heart disease of native coronary artery without angina pectoris: Secondary | ICD-10-CM | POA: Diagnosis not present

## 2014-11-30 DIAGNOSIS — E7800 Pure hypercholesterolemia, unspecified: Secondary | ICD-10-CM

## 2014-11-30 DIAGNOSIS — I483 Typical atrial flutter: Secondary | ICD-10-CM

## 2014-11-30 LAB — COMPREHENSIVE METABOLIC PANEL
ALBUMIN: 3.8 g/dL (ref 3.5–5.2)
ALT: 14 U/L (ref 0–53)
AST: 19 U/L (ref 0–37)
Alkaline Phosphatase: 102 U/L (ref 39–117)
BILIRUBIN TOTAL: 0.4 mg/dL (ref 0.2–1.2)
BUN: 16 mg/dL (ref 6–23)
CO2: 26 meq/L (ref 19–32)
Calcium: 8.9 mg/dL (ref 8.4–10.5)
Chloride: 105 mEq/L (ref 96–112)
Creat: 1.36 mg/dL — ABNORMAL HIGH (ref 0.50–1.35)
GLUCOSE: 97 mg/dL (ref 70–99)
Potassium: 4.5 mEq/L (ref 3.5–5.3)
SODIUM: 140 meq/L (ref 135–145)
TOTAL PROTEIN: 6.5 g/dL (ref 6.0–8.3)

## 2014-11-30 LAB — LIPID PANEL
Cholesterol: 213 mg/dL — ABNORMAL HIGH (ref 0–200)
HDL: 47 mg/dL (ref >=40)
LDL CALC: 155 mg/dL — AB (ref 0–99)
Total CHOL/HDL Ratio: 4.5 Ratio
Triglycerides: 54 mg/dL (ref <150)
VLDL: 11 mg/dL (ref 0–40)

## 2014-11-30 MED ORDER — HYDROCHLOROTHIAZIDE 12.5 MG PO CAPS: 12.5000 mg | ORAL_CAPSULE | Freq: Every morning | ORAL | Status: DC

## 2014-11-30 MED ORDER — METOPROLOL SUCCINATE ER 50 MG PO TB24: 50.0000 mg | ORAL_TABLET | Freq: Every day | ORAL | Status: DC

## 2014-11-30 NOTE — Assessment & Plan Note (Signed)
One isolated documented episode on treadmill previously. Previously declined Coumadin. Continue beta blocker.

## 2014-11-30 NOTE — Assessment & Plan Note (Signed)
Continue statin. Check lipids and liver. 

## 2014-11-30 NOTE — Patient Instructions (Signed)
Your physician wants you to follow-up in: [place] will receive a reminder letter in the mail two months in advance. If you don't receive a letter, please call our office to schedule the follow-up appointment.   STOP CLONIDINE  INCREASE METOPROLOL TO 50 MG ONCE DAILY= 2 OF THE 25 MG TABLETS ONCE DAILY  Your physician recommends that you HAVE LAB WORK TODAY

## 2014-11-30 NOTE — Assessment & Plan Note (Signed)
Blood pressure borderline.we will try and consolidate medications. Discontinue low-dose clonidine. Increase Toprol to 50 mg daily. Titrate medications based on follow-up readings. Check potassium and renal function.

## 2014-11-30 NOTE — Assessment & Plan Note (Signed)
Continue aspirin and statin. 

## 2014-12-23 ENCOUNTER — Other Ambulatory Visit: Payer: Self-pay | Admitting: Cardiology

## 2014-12-23 NOTE — Telephone Encounter (Signed)
Rx(s) sent to pharmacy electronically.  

## 2014-12-24 ENCOUNTER — Other Ambulatory Visit: Payer: Self-pay

## 2014-12-24 MED ORDER — ATORVASTATIN CALCIUM 80 MG PO TABS: 80.0000 mg | ORAL_TABLET | Freq: Every day | ORAL | Status: DC

## 2014-12-27 ENCOUNTER — Other Ambulatory Visit: Payer: Self-pay

## 2015-01-11 ENCOUNTER — Telehealth: Payer: Self-pay

## 2015-01-11 NOTE — Telephone Encounter (Signed)
Pt is calling to cancel his appointment because he is doing fine and done not feel like he needs to come in right now.

## 2015-01-11 NOTE — Telephone Encounter (Signed)
noted 

## 2015-01-11 NOTE — Telephone Encounter (Signed)
Communication noted.  

## 2015-01-18 ENCOUNTER — Ambulatory Visit: Payer: Medicare PPO | Admitting: Gastroenterology

## 2015-02-02 ENCOUNTER — Emergency Department (HOSPITAL_COMMUNITY)
Admission: EM | Admit: 2015-02-02 | Discharge: 2015-02-02 | Disposition: A | Discharge status: Home / Self Care | Payer: Medicare PPO | Attending: Emergency Medicine | Admitting: Emergency Medicine

## 2015-02-02 ENCOUNTER — Emergency Department (HOSPITAL_COMMUNITY): Payer: Medicare PPO

## 2015-02-02 ENCOUNTER — Encounter (HOSPITAL_COMMUNITY): Payer: Self-pay | Admitting: Emergency Medicine

## 2015-02-02 DIAGNOSIS — Z9861 Coronary angioplasty status: Secondary | ICD-10-CM | POA: Diagnosis not present

## 2015-02-02 DIAGNOSIS — I2510 Atherosclerotic heart disease of native coronary artery without angina pectoris: Secondary | ICD-10-CM | POA: Diagnosis not present

## 2015-02-02 DIAGNOSIS — E785 Hyperlipidemia, unspecified: Secondary | ICD-10-CM | POA: Diagnosis not present

## 2015-02-02 DIAGNOSIS — I10 Essential (primary) hypertension: Secondary | ICD-10-CM | POA: Insufficient documentation

## 2015-02-02 DIAGNOSIS — R05 Cough: Secondary | ICD-10-CM | POA: Insufficient documentation

## 2015-02-02 DIAGNOSIS — Z79899 Other long term (current) drug therapy: Secondary | ICD-10-CM | POA: Insufficient documentation

## 2015-02-02 DIAGNOSIS — Z791 Long term (current) use of non-steroidal anti-inflammatories (NSAID): Secondary | ICD-10-CM | POA: Diagnosis not present

## 2015-02-02 DIAGNOSIS — R0981 Nasal congestion: Secondary | ICD-10-CM

## 2015-02-02 DIAGNOSIS — Z7982 Long term (current) use of aspirin: Secondary | ICD-10-CM | POA: Insufficient documentation

## 2015-02-02 DIAGNOSIS — Z86010 Personal history of colonic polyps: Secondary | ICD-10-CM | POA: Diagnosis not present

## 2015-02-02 DIAGNOSIS — R059 Cough, unspecified: Secondary | ICD-10-CM

## 2015-02-02 MED ORDER — ALBUTEROL SULFATE (2.5 MG/3ML) 0.083% IN NEBU
2.5000 mg | INHALATION_SOLUTION | Freq: Once | RESPIRATORY_TRACT | Status: AC
  Administered 2015-02-02: 2.5 mg via RESPIRATORY_TRACT
  Filled 2015-02-02: qty 3

## 2015-02-02 MED ORDER — ALBUTEROL SULFATE HFA 108 (90 BASE) MCG/ACT IN AERS
2.0000 | INHALATION_SPRAY | RESPIRATORY_TRACT | Status: DC | PRN
  Administered 2015-02-02: 2 via RESPIRATORY_TRACT
  Filled 2015-02-02: qty 6.7

## 2015-02-02 MED ORDER — BENZONATATE 100 MG PO CAPS: 100.0000 mg | ORAL_CAPSULE | Freq: Three times a day (TID) | ORAL | Status: DC

## 2015-02-02 MED ORDER — PREDNISONE 20 MG PO TABS: 40.0000 mg | ORAL_TABLET | Freq: Every day | ORAL | Status: DC

## 2015-02-02 NOTE — ED Notes (Signed)
Inhaler provided by respiratory.  Pt states understanding of care given and follow up instructions.

## 2015-02-02 NOTE — Discharge Instructions (Signed)
Cough, Adult  A cough is a reflex that helps clear your throat and airways. It can help heal the body or may be a reaction to an irritated airway. A cough may only last 2 or 3 weeks (acute) or may last more than 8 weeks (chronic).  CAUSES Acute cough:  Viral or bacterial infections. Chronic cough:  Infections.  Allergies.  Asthma.  Post-nasal drip.  Smoking.  Heartburn or acid reflux.  Some medicines.  Chronic lung problems (COPD).  Cancer. SYMPTOMS   Cough.  Fever.  Chest pain.  Increased breathing rate.  High-pitched whistling sound when breathing (wheezing).  Colored mucus that you cough up (sputum). TREATMENT   A bacterial cough may be treated with antibiotic medicine.  A viral cough must run its course and will not respond to antibiotics.  Your caregiver may recommend other treatments if you have a chronic cough. HOME CARE INSTRUCTIONS   Only take over-the-counter or prescription medicines for pain, discomfort, or fever as directed by your caregiver. Use cough suppressants only as directed by your caregiver.  Use a cold steam vaporizer or humidifier in your bedroom or home to help loosen secretions.  Sleep in a semi-upright position if your cough is worse at night.  Rest as needed.  Stop smoking if you smoke. SEEK IMMEDIATE MEDICAL CARE IF:   You have pus in your sputum.  Your cough starts to worsen.  You cannot control your cough with suppressants and are losing sleep.  You begin coughing up blood.  You have difficulty breathing.  You develop pain which is getting worse or is uncontrolled with medicine.  You have a fever. MAKE SURE YOU:   Understand these instructions.  Will watch your condition.  Will get help right away if you are not doing well or get worse. Document Released: 12/15/2010 Document Revised: 09/10/2011 Document Reviewed: 12/15/2010 Exit[employer] Patient Information 2015 [employer]. This information is not intended  to replace advice given to you by your health care provider. Make sure you discuss any questions you have with your health care provider.  

## 2015-02-02 NOTE — ED Notes (Signed)
Pt c/o congestion in chest, nose and sinus xone day.

## 2015-02-02 NOTE — ED Provider Notes (Signed)
CSN: [id number]     Arrival date & time 02/02/15  7619 History   First MD Initiated Contact with Patient 02/02/15 [phone]     Chief Complaint  Patient presents with  . Nasal Congestion     (Consider location/radiation/quality/duration/timing/severity/associated sxs/prior Treatment) HPI Comments: Patient presents to the emergency department for evaluation of nasal congestion, chest congestion and cough. Symptoms began last night. Patient reports that he has had ongoing nasal drainage for several days, but worse last night and he has now developed severe chest congestion and cough. He does not have any chronic lung disease. He has taken Mucinex without relief.   Past Medical History  Diagnosis Date  . Arthritis   . GERD (gastroesophageal reflux disease)   . Hypertension   . Degenerative joint disease   . H/O hiatal hernia   . Hyperlipidemia   . BPH (benign prostatic hypertrophy)   . Status post dilation of esophageal narrowing   . History of atrial flutter     REMOTE HX ON TREADMILL  . S/P drug eluting coronary stent placement     11-26-2001   X2 DES  TO LAD &  BMS  . CAD (coronary artery disease)     a. Lexiscan Myoview (11/2013):  Normal stress nuclear study.  LV Ejection Fraction: 65%  . History of colon polyps   . Schatzki's ring   . Lower urinary tract symptoms (LUTS)   . RBBB (right bundle branch block)   . IBS (irritable bowel syndrome)   . Complication of anesthesia     hx PONV   Past Surgical History  Procedure Laterality Date  . Cholecystectomy    . Colonoscopy  05/29/2011    RMR: tubular adenoma  . Esophagogastroduodenoscopy  04/05/2004    RMR: Small hiatal hernia/Questionable subtle Schatzki's ring, status post dilation as described  . Esophagogastroduodenoscopy  05/2011    RMR: small hiatal hernia, empiric dilation with 62F and 79F Maloney  . Esophagogastroduodenoscopy (egd) with esophageal dilation N/A 01/16/2013    Dr. Rourk:baggy somewhat atonic esophagus-query  underlying esophageal motility disorder. Status post passage of a Maloney dilator (large bore) empirically. Status post esophageal biopsy/Reflux symptoms are well controlled on Nexium.benign path  . Appendectomy    . Total knee arthroplasty Left 07-27-2002  . Total hip arthroplasty Left 09-07-2003  . Cardiovascular stress test  12-09-2013    NUCLEAR LEXISCAN STUDY--  RESULTS PENDING  . Cardiovascular stress test  04/2011  DR CRENSHAW    NORMAL /  NO ISCHEMIA/  EF 67%  . Coronary angioplasty with stent placement  11-26-2001  DR Darnell Level BRODIE    DES (Cypher) to LAD 70%/  BMS (Express) to OSTIAL D1 80%/   CFX  &  RCA normal/  EF 65%  . Transurethral resection of prostate N/A 12/21/2013    Procedure: TRANSURETHRAL RESECTION OF THE PROSTATE WITH GYRUS INSTRUMENTS;  Surgeon: Jorja Loa, MD;  Location: [facility];  Service: Urology;  Laterality: N/A;   Family History  Problem Relation Age of Onset  . Hypertension Mother   . Coronary artery disease Mother   . Kidney failure Mother   . Cancer Father     Lung  . Cancer Sister     breast carcinoma  . Hypertension Sister   . Colon cancer Neg Hx    History  Substance Use Topics  . Smoking status: Never Smoker   . Smokeless tobacco: Never Used  . Alcohol Use: 0.0 oz/week     Comment: on  holidays    Review of Systems  HENT: Positive for congestion.   Respiratory: Positive for cough.   All other systems reviewed and are negative.     Allergies  Codeine  Home Medications   Prior to Admission medications   Medication Sig Start Date End Date Taking? Authorizing Provider  amLODipine (NORVASC) 10 MG tablet Take 10 mg by mouth every morning.    Historical Provider, MD  aspirin 325 MG EC tablet Take 325 mg by mouth daily.    Historical Provider, MD  atorvastatin (LIPITOR) 80 MG tablet Take 1 tablet (80 mg total) by mouth daily at 6 PM. 12/24/14   Lelon Perla, MD  docusate sodium 100 MG CAPS Take 100 mg by mouth  2 (two) times daily. 12/22/13   Franchot Gallo, MD  esomeprazole (NEXIUM) 40 MG capsule Take 40 mg by mouth every morning.    Historical Provider, MD  hydrochlorothiazide (MICROZIDE) 12.5 MG capsule Take 1 capsule (12.5 mg total) by mouth every morning. 11/30/14   Lelon Perla, MD  irbesartan (AVAPRO) 300 MG tablet take 1 tablet by mouth once daily 12/23/14   Lelon Perla, MD  meloxicam (MOBIC) 7.5 MG tablet Take 7.5 mg by mouth daily.  09/16/14   Historical Provider, MD  metoprolol succinate (TOPROL-XL) 50 MG 24 hr tablet Take 1 tablet (50 mg total) by mouth daily. 11/30/14   Lelon Perla, MD  sulfamethoxazole-trimethoprim (BACTRIM DS) 800-160 MG per tablet Take 1 tablet by mouth 2 (two) times daily. 12/22/13   Franchot Gallo, MD   BP 174/99 mmHg  Pulse 88  Temp(Src) 98.5 F (36.9 C)  Resp 21  Ht 6\' 1"  (1.854 m)  Wt 317 lb (143.79 kg)  BMI 41.83 kg/m2  SpO2 98% Physical Exam  Constitutional: He is oriented to person, place, and time. He appears well-developed and well-nourished. No distress.  HENT:  Head: Normocephalic and atraumatic.  Right Ear: Hearing normal.  Left Ear: Hearing normal.  Nose: Nose normal.  Mouth/Throat: Oropharynx is clear and moist and mucous membranes are normal.  Eyes: Conjunctivae and EOM are normal. Pupils are equal, round, and reactive to light.  Neck: Normal range of motion. Neck supple.  Cardiovascular: Regular rhythm, S1 normal and S2 normal.  Exam reveals no gallop and no friction rub.   No murmur heard. Pulmonary/Chest: Effort normal and breath sounds normal. No respiratory distress. He exhibits no tenderness.  Abdominal: Soft. Normal appearance and bowel sounds are normal. There is no hepatosplenomegaly. There is no tenderness. There is no rebound, no guarding, no tenderness at McBurney's point and negative Murphy's sign. No hernia.  Musculoskeletal: Normal range of motion.  Neurological: He is alert and oriented to person, place, and time.  He has normal strength. No cranial nerve deficit or sensory deficit. Coordination normal. GCS eye subscore is 4. GCS verbal subscore is 5. GCS motor subscore is 6.  Skin: Skin is warm, dry and intact. No rash noted. No cyanosis.  Psychiatric: He has a normal mood and affect. His speech is normal and behavior is normal. Thought content normal.  Nursing note and vitals reviewed.   ED Course  Procedures (including critical care time) Labs Review Labs Reviewed - No data to display  Imaging Review Dg Chest 2 View  02/02/2015   CLINICAL DATA:  Cough, congestion and dyspnea since yesterday morning  EXAM: CHEST  2 VIEW  COMPARISON:  07/14/2010  FINDINGS: The heart size and mediastinal contours are within normal limits. Both lungs  are clear. The visualized skeletal structures are unremarkable.  IMPRESSION: No active cardiopulmonary disease.   Electronically Signed   By: Andreas Newport M.D.   On: 02/02/2015 04:48     EKG Interpretation None      MDM   Final diagnoses:  Cough    Presents to the ER for evaluation of upper respiratory infection type symptoms. Chest x-ray performed, no evidence of pneumonia or other acute pathology. Patient treated symptomatically.    Orpah Greek, MD 02/05/15 1230

## 2015-03-17 ENCOUNTER — Ambulatory Visit (INDEPENDENT_AMBULATORY_CARE_PROVIDER_SITE_OTHER): Payer: Medicare PPO | Admitting: Otolaryngology

## 2015-03-17 DIAGNOSIS — J310 Chronic rhinitis: Secondary | ICD-10-CM | POA: Diagnosis not present

## 2015-03-17 DIAGNOSIS — J342 Deviated nasal septum: Secondary | ICD-10-CM

## 2015-03-17 DIAGNOSIS — J343 Hypertrophy of nasal turbinates: Secondary | ICD-10-CM

## 2015-04-28 ENCOUNTER — Ambulatory Visit (INDEPENDENT_AMBULATORY_CARE_PROVIDER_SITE_OTHER): Payer: Medicare PPO | Admitting: Otolaryngology

## 2015-04-28 DIAGNOSIS — J343 Hypertrophy of nasal turbinates: Secondary | ICD-10-CM

## 2015-04-28 DIAGNOSIS — J342 Deviated nasal septum: Secondary | ICD-10-CM | POA: Diagnosis not present

## 2015-08-17 ENCOUNTER — Other Ambulatory Visit: Payer: Self-pay | Admitting: *Deleted

## 2015-08-17 MED ORDER — ATORVASTATIN CALCIUM 80 MG PO TABS: 80.0000 mg | ORAL_TABLET | Freq: Every day | ORAL | Status: DC

## 2015-09-27 ENCOUNTER — Encounter: Payer: Self-pay | Admitting: Internal Medicine

## 2015-10-26 ENCOUNTER — Other Ambulatory Visit: Payer: Self-pay

## 2015-10-26 MED ORDER — IRBESARTAN 300 MG PO TABS: 300.0000 mg | ORAL_TABLET | Freq: Every day | ORAL | Status: DC

## 2015-10-26 NOTE — Telephone Encounter (Signed)
Rx(s) sent to pharmacy electronically.  

## 2015-10-27 ENCOUNTER — Ambulatory Visit (INDEPENDENT_AMBULATORY_CARE_PROVIDER_SITE_OTHER): Payer: Medicare PPO | Admitting: Otolaryngology

## 2015-11-22 NOTE — Progress Notes (Signed)
HPI: FU CAD s/p DES to LAD and BMS to Dx in 2003, HL, HTN, AFlutter (transient on treadmill - declined coumadin). Since last seen, He denies dyspnea, chest pain, palpitations or syncope. Minimal pedal edema.    Studies: - LHC (10/2001): Mid LAD 70%, Ostial D1 80%, CFX and RCA normal, EF 65%. PCI: Cypher DES to LAD and Express BMS to Dx.  - Nuclear (6/15): No ischemia, EF 65%, Nomal  Current Outpatient Prescriptions  Medication Sig Dispense Refill  . amLODipine (NORVASC) 10 MG tablet Take 10 mg by mouth every morning.    Marland Kitchen aspirin 325 MG EC tablet Take 325 mg by mouth daily.    Marland Kitchen atorvastatin (LIPITOR) 80 MG tablet Take 1 tablet (80 mg total) by mouth daily at 6 PM. 30 tablet 6  . benzonatate (TESSALON) 100 MG capsule Take 1 capsule (100 mg total) by mouth every 8 (eight) hours. 21 capsule 0  . docusate sodium 100 MG CAPS Take 100 mg by mouth 2 (two) times daily. 30 capsule 0  . esomeprazole (NEXIUM) 40 MG capsule Take 40 mg by mouth every morning.    . hydrochlorothiazide (MICROZIDE) 12.5 MG capsule Take 1 capsule (12.5 mg total) by mouth every morning. 30 capsule 11  . irbesartan (AVAPRO) 300 MG tablet Take 1 tablet (300 mg total) by mouth daily. 90 tablet 0  . meloxicam (MOBIC) 7.5 MG tablet Take 7.5 mg by mouth daily.   1  . metoprolol succinate (TOPROL-XL) 50 MG 24 hr tablet Take 1 tablet (50 mg total) by mouth daily. 90 tablet 3  . predniSONE (DELTASONE) 20 MG tablet Take 2 tablets (40 mg total) by mouth daily with breakfast. 10 tablet 0  . sulfamethoxazole-trimethoprim (BACTRIM DS) 800-160 MG per tablet Take 1 tablet by mouth 2 (two) times daily. 10 tablet 0   No current facility-administered medications for this visit.     Past Medical History  Diagnosis Date  . Arthritis   . GERD (gastroesophageal reflux disease)   . Hypertension   . Degenerative joint disease   . H/O hiatal hernia   . Hyperlipidemia   . BPH (benign prostatic hypertrophy)   . Status post  dilation of esophageal narrowing   . History of atrial flutter     REMOTE HX ON TREADMILL  . S/P drug eluting coronary stent placement     11-26-2001   X2 DES  TO LAD &  BMS  . CAD (coronary artery disease)     a. Lexiscan Myoview (11/2013):  Normal stress nuclear study.  LV Ejection Fraction: 65%  . History of colon polyps   . Schatzki's ring   . Lower urinary tract symptoms (LUTS)   . RBBB (right bundle branch block)   . IBS (irritable bowel syndrome)   . Complication of anesthesia     hx PONV    Past Surgical History  Procedure Laterality Date  . Cholecystectomy    . Colonoscopy  05/29/2011    RMR: tubular adenoma  . Esophagogastroduodenoscopy  04/05/2004    RMR: Small hiatal hernia/Questionable subtle Schatzki's ring, status post dilation as described  . Esophagogastroduodenoscopy  05/2011    RMR: small hiatal hernia, empiric dilation with 43F and 67F Maloney  . Esophagogastroduodenoscopy (egd) with esophageal dilation N/A 01/16/2013    Dr. Rourk:baggy somewhat atonic esophagus-query underlying esophageal motility disorder. Status post passage of a Maloney dilator (large bore) empirically. Status post esophageal biopsy/Reflux symptoms are well controlled on Nexium.benign path  . Appendectomy    .  Total knee arthroplasty Left 07-27-2002  . Total hip arthroplasty Left 09-07-2003  . Cardiovascular stress test  12-09-2013    NUCLEAR LEXISCAN STUDY--  RESULTS PENDING  . Cardiovascular stress test  04/2011  DR Jesalyn Finazzo    NORMAL /  NO ISCHEMIA/  EF 67%  . Coronary angioplasty with stent placement  11-26-2001  DR Darnell Level BRODIE    DES (Cypher) to LAD 70%/  BMS (Express) to OSTIAL D1 80%/   CFX  &  RCA normal/  EF 65%  . Transurethral resection of prostate N/A 12/21/2013    Procedure: TRANSURETHRAL RESECTION OF THE PROSTATE WITH GYRUS INSTRUMENTS;  Surgeon: Jorja Loa, MD;  Location: [facility];  Service: Urology;  Laterality: N/A;    Social History   Social  History  . Marital Status: Married    Spouse Name: N/A  . Number of Children: N/A  . Years of Education: N/A   Occupational History  . Not on file.   Social History Main Topics  . Smoking status: Never Smoker   . Smokeless tobacco: Never Used  . Alcohol Use: 0.0 oz/week     Comment: on holidays  . Drug Use: No  . Sexual Activity: No   Other Topics Concern  . Not on file   Social History Narrative   Retired from [employer]    Family History  Problem Relation Age of Onset  . Hypertension Mother   . Coronary artery disease Mother   . Kidney failure Mother   . Cancer Father     Lung  . Cancer Sister     breast carcinoma  . Hypertension Sister   . Colon cancer Neg Hx     ROS: Arthralgias and shoulders and back but no fevers or chills, productive cough, hemoptysis, dysphasia, odynophagia, melena, hematochezia, dysuria, hematuria, rash, seizure activity, orthopnea, PND, claudication. Remaining systems are negative.  Physical Exam: Well-developed obese in no acute distress.  Skin is warm and dry.  HEENT is normal.  Neck is supple.  Chest is clear to auscultation with normal expansion.  Cardiovascular exam is regular rate and rhythm.  Abdominal exam nontender or distended. No masses palpated. Positive bruit Extremities show trace edema. neuro grossly intact  ECG Sinus rhythm, right bundle branch block.

## 2015-11-24 ENCOUNTER — Encounter: Payer: Self-pay | Admitting: Cardiology

## 2015-11-24 ENCOUNTER — Ambulatory Visit (INDEPENDENT_AMBULATORY_CARE_PROVIDER_SITE_OTHER): Payer: Medicare PPO | Admitting: Cardiology

## 2015-11-24 VITALS — BP 165/97 | HR 69 | Ht 73.0 in | Wt 321.0 lb

## 2015-11-24 DIAGNOSIS — E785 Hyperlipidemia, unspecified: Secondary | ICD-10-CM | POA: Diagnosis not present

## 2015-11-24 DIAGNOSIS — R0989 Other specified symptoms and signs involving the circulatory and respiratory systems: Secondary | ICD-10-CM | POA: Insufficient documentation

## 2015-11-24 DIAGNOSIS — I4892 Unspecified atrial flutter: Secondary | ICD-10-CM | POA: Diagnosis not present

## 2015-11-24 DIAGNOSIS — I2510 Atherosclerotic heart disease of native coronary artery without angina pectoris: Secondary | ICD-10-CM | POA: Diagnosis not present

## 2015-11-24 DIAGNOSIS — R0683 Snoring: Secondary | ICD-10-CM

## 2015-11-24 LAB — LIPID PANEL
Cholesterol: 115 mg/dL — ABNORMAL LOW (ref 125–200)
HDL: 54 mg/dL (ref >=40)
LDL Cholesterol: 51 mg/dL (ref <130)
Total CHOL/HDL Ratio: 2.1 Ratio (ref <=5.0)
Triglycerides: 48 mg/dL (ref <150)
VLDL: 10 mg/dL (ref <30)

## 2015-11-24 LAB — HEPATIC FUNCTION PANEL
ALK PHOS: 114 U/L (ref 40–115)
ALT: 14 U/L (ref 9–46)
AST: 22 U/L (ref 10–35)
Albumin: 3.7 g/dL (ref 3.6–5.1)
BILIRUBIN DIRECT: 0.2 mg/dL (ref <=0.2)
BILIRUBIN TOTAL: 0.6 mg/dL (ref 0.2–1.2)
Indirect Bilirubin: 0.4 mg/dL (ref 0.2–1.2)
Total Protein: 6.5 g/dL (ref 6.1–8.1)

## 2015-11-24 LAB — BASIC METABOLIC PANEL
BUN: 14 mg/dL (ref 7–25)
CALCIUM: 8.8 mg/dL (ref 8.6–10.3)
CO2: 25 mmol/L (ref 20–31)
CREATININE: 1.18 mg/dL (ref 0.70–1.18)
Chloride: 104 mmol/L (ref 98–110)
GLUCOSE: 90 mg/dL (ref 65–99)
Potassium: 4.5 mmol/L (ref 3.5–5.3)
Sodium: 139 mmol/L (ref 135–146)

## 2015-11-24 MED ORDER — METOPROLOL SUCCINATE ER 100 MG PO TB24
100.0000 mg | ORAL_TABLET | Freq: Every day | ORAL | Status: DC
Start: 2015-11-24 — End: 2016-12-27

## 2015-11-24 NOTE — Assessment & Plan Note (Signed)
Abdominal ultrasound to exclude aneurysm. 

## 2015-11-24 NOTE — Assessment & Plan Note (Addendum)
Blood pressure is elevated.Increase Toprol to 100 mg daily and follow. Check potassium and renal function. Patient snores by his report. We will arrange a pulmonary evaluation for consideration of sleep study and sleep apnea evaluation.

## 2015-11-24 NOTE — Patient Instructions (Signed)
Medication Instructions:   INCREASE METOPROLOL TO 100 MG ONCE DAILY= 2 OF THE 50 MG TABLETS ONCE DAILY  Labwork:  Your physician recommends that you return for lab work WHEN FASTING  Testing/Procedures:  Your physician has requested that you have an abdominal aorta duplex. During this test, an ultrasound is used to evaluate the aorta. Allow 30 minutes for this exam. Do not eat after midnight the day before and avoid carbonated beverages   Follow-Up:  Your physician wants you to follow-up in: [place] will receive a reminder letter in the mail two months in advance. If you don't receive a letter, please call our office to schedule the follow-up appointment.   REFERRAL TO PULMONARY FOR SNORING AND POSS SLEEP APNEA  If you need a refill on your cardiac medications before your next appointment, please call your pharmacy.

## 2015-11-24 NOTE — Assessment & Plan Note (Signed)
One isolated documented episode on treadmill previously. Previously declined Coumadin. Continue beta blocker. 

## 2015-11-24 NOTE — Assessment & Plan Note (Signed)
Continue statin. Check lipids and liver. 

## 2015-11-24 NOTE — Assessment & Plan Note (Signed)
Continue aspirin and statin. 

## 2015-12-06 ENCOUNTER — Ambulatory Visit (HOSPITAL_COMMUNITY)
Admission: RE | Admit: 2015-12-06 | Discharge: 2015-12-06 | Disposition: A | Discharge status: Home / Self Care | Payer: Medicare PPO | Source: Ambulatory Visit | Attending: Cardiovascular Disease | Admitting: Cardiovascular Disease

## 2015-12-06 DIAGNOSIS — K219 Gastro-esophageal reflux disease without esophagitis: Secondary | ICD-10-CM | POA: Insufficient documentation

## 2015-12-06 DIAGNOSIS — I10 Essential (primary) hypertension: Secondary | ICD-10-CM | POA: Insufficient documentation

## 2015-12-06 DIAGNOSIS — I4510 Unspecified right bundle-branch block: Secondary | ICD-10-CM | POA: Diagnosis not present

## 2015-12-06 DIAGNOSIS — R0989 Other specified symptoms and signs involving the circulatory and respiratory systems: Secondary | ICD-10-CM

## 2015-12-06 DIAGNOSIS — E785 Hyperlipidemia, unspecified: Secondary | ICD-10-CM | POA: Diagnosis not present

## 2015-12-06 DIAGNOSIS — I2510 Atherosclerotic heart disease of native coronary artery without angina pectoris: Secondary | ICD-10-CM | POA: Diagnosis not present

## 2015-12-06 DIAGNOSIS — I714 Abdominal aortic aneurysm, without rupture: Secondary | ICD-10-CM | POA: Diagnosis not present

## 2015-12-06 DIAGNOSIS — I708 Atherosclerosis of other arteries: Secondary | ICD-10-CM | POA: Insufficient documentation

## 2015-12-06 DIAGNOSIS — I700 Atherosclerosis of aorta: Secondary | ICD-10-CM | POA: Insufficient documentation

## 2015-12-12 ENCOUNTER — Encounter: Payer: Self-pay | Admitting: Cardiology

## 2015-12-20 ENCOUNTER — Other Ambulatory Visit: Payer: Self-pay

## 2015-12-20 MED ORDER — HYDROCHLOROTHIAZIDE 12.5 MG PO CAPS: 12.5000 mg | ORAL_CAPSULE | Freq: Every morning | ORAL | Status: DC

## 2015-12-24 ENCOUNTER — Encounter: Payer: Self-pay | Admitting: Cardiology

## 2016-01-18 ENCOUNTER — Other Ambulatory Visit: Payer: Self-pay

## 2016-01-18 MED ORDER — IRBESARTAN 300 MG PO TABS: 300.0000 mg | ORAL_TABLET | Freq: Every day | ORAL | Status: DC

## 2016-02-06 DIAGNOSIS — M159 Polyosteoarthritis, unspecified: Secondary | ICD-10-CM | POA: Diagnosis not present

## 2016-02-06 DIAGNOSIS — I2510 Atherosclerotic heart disease of native coronary artery without angina pectoris: Secondary | ICD-10-CM | POA: Diagnosis not present

## 2016-02-06 DIAGNOSIS — I10 Essential (primary) hypertension: Secondary | ICD-10-CM | POA: Diagnosis not present

## 2016-02-06 DIAGNOSIS — M545 Low back pain: Secondary | ICD-10-CM | POA: Diagnosis not present

## 2016-02-28 ENCOUNTER — Encounter: Payer: Self-pay | Admitting: Pulmonary Disease

## 2016-02-28 ENCOUNTER — Ambulatory Visit (INDEPENDENT_AMBULATORY_CARE_PROVIDER_SITE_OTHER): Payer: Medicare PPO | Admitting: Pulmonary Disease

## 2016-02-28 VITALS — BP 124/78 | HR 56 | Ht 73.0 in | Wt 311.0 lb

## 2016-02-28 DIAGNOSIS — R0683 Snoring: Secondary | ICD-10-CM | POA: Diagnosis not present

## 2016-02-28 DIAGNOSIS — Z6841 Body Mass Index (BMI) 40.0 and over, adult: Secondary | ICD-10-CM

## 2016-02-28 NOTE — Patient Instructions (Signed)
Will arrange for home sleep study Will call to arrange for follow up after sleep study reviewed  

## 2016-02-28 NOTE — Progress Notes (Signed)
   Subjective:    Patient ID: Gregory Wilson, male    DOB: 06-18-1941, 75 y.o.   MRN: [id number]  HPI    Review of Systems  Constitutional: Negative for fever and unexpected weight change.  HENT: Negative for congestion, dental problem, ear pain, nosebleeds, postnasal drip, rhinorrhea, sinus pressure, sneezing, sore throat and trouble swallowing.   Eyes: Negative for redness and itching.  Respiratory: Negative for cough, chest tightness, shortness of breath and wheezing.   Cardiovascular: Negative for palpitations and leg swelling.  Gastrointestinal: Negative for nausea and vomiting.       Acid heartburn  Genitourinary: Negative for dysuria.  Musculoskeletal: Positive for arthralgias and joint swelling.  Skin: Negative for rash.  Neurological: Negative for headaches.  Hematological: Does not bruise/bleed easily.  Psychiatric/Behavioral: Negative for dysphoric mood. The patient is not nervous/anxious.        Objective:   Physical Exam        Assessment & Plan:

## 2016-02-28 NOTE — Progress Notes (Signed)
Past Surgical History He  has a past surgical history that includes Cholecystectomy; Colonoscopy (05/29/2011); Esophagogastroduodenoscopy (04/05/2004); Esophagogastroduodenoscopy (05/2011); Esophagogastroduodenoscopy (egd) with esophageal dilation (N/A, 01/16/2013); Appendectomy; Total knee arthroplasty (Left, 07-27-2002); Total hip arthroplasty (Left, 09-07-2003); Cardiovascular stress test (12-09-2013); Cardiovascular stress test (04/2011  DR CRENSHAW); Coronary angioplasty with stent (11-26-2001  DR Eustace Quail); and Transurethral resection of prostate (N/A, 12/21/2013).  Allergies  Allergen Reactions  . Codeine Nausea And Vomiting    Family History His family history includes Cancer in his father and sister; Coronary artery disease in his mother; Hypertension in his mother and sister; Kidney failure in his mother.  Social History He  reports that he has never smoked. He has never used smokeless tobacco. He reports that he drinks alcohol. He reports that he does not use drugs.  Review of systems Constitutional: Negative for fever and unexpected weight change.  HENT: Negative for congestion, dental problem, ear pain, nosebleeds, postnasal drip, rhinorrhea, sinus pressure, sneezing, sore throat and trouble swallowing.   Eyes: Negative for redness and itching.  Respiratory: Negative for cough, chest tightness, shortness of breath and wheezing.   Cardiovascular: Negative for palpitations and leg swelling.  Gastrointestinal: Negative for nausea and vomiting.       Acid heartburn  Genitourinary: Negative for dysuria.  Musculoskeletal: Positive for arthralgias and joint swelling.  Skin: Negative for rash.  Neurological: Negative for headaches.  Hematological: Does not bruise/bleed easily.  Psychiatric/Behavioral: Negative for dysphoric mood. The patient is not nervous/anxious.    Current Outpatient Prescriptions on File Prior to Visit  Medication Sig  . amLODipine (NORVASC) 10 MG tablet  Take 10 mg by mouth every morning.  Marland Kitchen aspirin 325 MG EC tablet Take 325 mg by mouth daily.  Marland Kitchen atorvastatin (LIPITOR) 80 MG tablet Take 1 tablet (80 mg total) by mouth daily at 6 PM.  . benzonatate (TESSALON) 100 MG capsule Take 1 capsule (100 mg total) by mouth every 8 (eight) hours.  . docusate sodium 100 MG CAPS Take 100 mg by mouth 2 (two) times daily.  Marland Kitchen esomeprazole (NEXIUM) 40 MG capsule Take 40 mg by mouth every morning.  . hydrochlorothiazide (MICROZIDE) 12.5 MG capsule Take 1 capsule (12.5 mg total) by mouth every morning.  . irbesartan (AVAPRO) 300 MG tablet Take 1 tablet (300 mg total) by mouth daily.  . meloxicam (MOBIC) 7.5 MG tablet Take 7.5 mg by mouth daily.   . metoprolol succinate (TOPROL-XL) 100 MG 24 hr tablet Take 1 tablet (100 mg total) by mouth daily.   No current facility-administered medications on file prior to visit.     Chief Complaint  Patient presents with  . Sleep Consult    Referred by Dr Stanford Breed for sleep. Epworth Score: 8    Tests:  Past medical history He  has a past medical history of Arthritis; BPH (benign prostatic hypertrophy); CAD (coronary artery disease); Complication of anesthesia; Degenerative joint disease; GERD (gastroesophageal reflux disease); H/O hiatal hernia; History of atrial flutter; History of colon polyps; Hyperlipidemia; Hypertension; IBS (irritable bowel syndrome); Lower urinary tract symptoms (LUTS); RBBB (right bundle branch block); S/P drug eluting coronary stent placement; Schatzki's ring; and Status post dilation of esophageal narrowing.  Vital signs BP 124/78 (BP Location: Left Arm, Cuff Size: Normal)   Pulse (!) 56   Ht 6\' 1"  (1.854 m)   Wt (!) 311 lb (141.1 kg)   SpO2 97%   BMI 41.03 kg/m   History of Present Illness Gregory Wilson is a 75 y.o. male  for evaluation of sleep problems.  He is followed by cardiology.  His wife has sleep apnea and uses CPAP.  He had mentioned that he snores and has trouble with his  sleep.  His wife says he stops breathing, and he wakes up with a snort.  He has trouble sleeping on his back.  He doesn't dream much.  His mouth can get dry at night.    He goes to sleep between 9 pm and midnight.  He falls asleep in 10 minutes.  He wakes up 2 to 3 times to use the bathroom.  He gets out of bed between 8 and 9 am.  He feels tired in the morning.  He denies morning headache.  He does not use anything to help him fall sleep or stay awake.  He denies sleep walking, sleep talking, bruxism, or nightmares.  There is no history of restless legs.  He denies sleep hallucinations, sleep paralysis, or cataplexy.  The Epworth score is 8 out of 24.   Physical Exam:  General - No distress ENT - No sinus tenderness, no oral exudate, no LAN, no thyromegaly, TM clear, pupils equal/reactive, MP 3 Cardiac - s1s2 regular, no murmur, pulses symmetric Chest - No wheeze/rales/dullness, good air entry, normal respiratory excursion Back - No focal tenderness Abd - Soft, non-tender, no organomegaly, + bowel sounds Ext - No edema Neuro - Normal strength, cranial nerves intact Skin - No rashes Psych - Normal mood, and behavior  Discussion: He has snoring, sleep disruption, witnessed apnea, and daytime sleepiness.  He has hx of CAD and HTN.  I am concerned he could have sleep apnea.  We discussed how sleep apnea can affect various health problems, including risks for hypertension, cardiovascular disease, and diabetes.  We also discussed how sleep disruption can increase risks for accidents, such as while driving.  Weight loss as a means of improving sleep apnea was also reviewed.  Additional treatment options discussed were CPAP therapy, oral appliance, and surgical intervention.   Assessment/plan:  Snoring with concern for obstructive sleep apnea. - will arrange for home sleep study, pending insurance approval - he would like mini CPAP to make travel easier if he is found to have sleep  apnea  Obesity. - discussed importance of weight loss   Patient Instructions  Will arrange for home sleep study Will call to arrange for follow up after sleep study reviewed    Chesley Mires, M.D. Pager [phone] 02/28/2016, 1:58 PM

## 2016-03-19 DIAGNOSIS — G4733 Obstructive sleep apnea (adult) (pediatric): Secondary | ICD-10-CM | POA: Diagnosis not present

## 2016-03-25 ENCOUNTER — Other Ambulatory Visit: Payer: Self-pay | Admitting: Cardiology

## 2016-03-26 ENCOUNTER — Encounter: Payer: Self-pay | Admitting: Pulmonary Disease

## 2016-03-26 ENCOUNTER — Other Ambulatory Visit: Payer: Self-pay | Admitting: *Deleted

## 2016-03-26 ENCOUNTER — Telehealth: Payer: Self-pay | Admitting: Pulmonary Disease

## 2016-03-26 DIAGNOSIS — R0683 Snoring: Secondary | ICD-10-CM

## 2016-03-26 DIAGNOSIS — G4733 Obstructive sleep apnea (adult) (pediatric): Secondary | ICD-10-CM

## 2016-03-26 HISTORY — DX: Obstructive sleep apnea (adult) (pediatric): G47.33

## 2016-03-26 NOTE — Telephone Encounter (Signed)
HST 03/19/16 >> AHI 29.7, SaO2 low 81%.   Will have my nurse inform pt that sleep study shows moderate to severe sleep apnea.  Options are 1) CPAP now, 2) ROV first.  If He is agreeable to CPAP, then please send order for auto CPAP range 5 to 15 cm H2O with heated humidity and mask of choice >> please send order for Resmed air mini device.  Have download sent 1 month after starting CPAP and set up ROV 2 months after starting CPAP.

## 2016-03-28 NOTE — Telephone Encounter (Signed)
LM x 1 

## 2016-03-28 NOTE — Telephone Encounter (Signed)
Pt returned call Advised pt of VS' recommendations as stated below.  Pt stated that he would prefer to come in for visit prior to starting CPAP therapy.  VS with no openings until 11.14.17.  Appt has been scheduled with TP for 10.9.17 @ 1115 (this date is per pt's request).  Appt card mailed to pt's home address.  Nothing further needed; will sign off.

## 2016-04-09 ENCOUNTER — Ambulatory Visit (INDEPENDENT_AMBULATORY_CARE_PROVIDER_SITE_OTHER): Payer: Medicare PPO | Admitting: Adult Health

## 2016-04-09 ENCOUNTER — Encounter: Payer: Self-pay | Admitting: Adult Health

## 2016-04-09 DIAGNOSIS — G4733 Obstructive sleep apnea (adult) (pediatric): Secondary | ICD-10-CM

## 2016-04-09 NOTE — Assessment & Plan Note (Addendum)
Moderate obstructive sleep apnea Patient education on OSA and C Pap usage Begin CPAP At bedtime  On auto set 5-15cmH2O.  Plan  Patient Instructions  Begin CPAP At bedtime  .  Order for Mini CPAP to DME.  Wear for at least 4-6 hr each night . Do not drive if sleepy.  Work on weight loss.  CPAP .download in 4 weeks .  Follow up Dr. Halford Chessman  In 2 months and As needed

## 2016-04-09 NOTE — Progress Notes (Signed)
Subjective:    Patient ID: Gregory Wilson, male    DOB: 1940-11-26, 75 y.o.   MRN: [id number]  HPI 75 year old male seen for a sleep consult on 02/28/2016 with daytime sleepiness and snoring . Found to have moderate to severe sleep apnea  TEST  Home sleep study 03/19/2016> AHI 29.7, SaO2 low 81%  04/09/2016 Follow up : OSA  Patient returns for a 6 week follow-up. Patient was recently seen for a sleep consult 02/28/2016. He had some daytime sleepiness and snoring. He was set up for a home sleep study on 03/19/2016 that showed moderate sleep apnea with an AHI 29.7, SaO2 low 81%. We discussed his sleep test study results with patient education on obstructive sleep apnea. We reviewed treatment options including weight loss and C Pap . He would like to proceed with C Pap at bedtime. He denies any chest pain, orthopnea, PND, or increased leg swelling. He travels a lot. And would like order for Mini CPAP .    Past Medical History:  Diagnosis Date  . Arthritis   . BPH (benign prostatic hypertrophy)   . CAD (coronary artery disease)    a. Lexiscan Myoview (11/2013):  Normal stress nuclear study.  LV Ejection Fraction: 65%  . Complication of anesthesia    hx PONV  . Degenerative joint disease   . GERD (gastroesophageal reflux disease)   . H/O hiatal hernia   . History of atrial flutter    REMOTE HX ON TREADMILL  . History of colon polyps   . Hyperlipidemia   . Hypertension   . IBS (irritable bowel syndrome)   . Lower urinary tract symptoms (LUTS)   . OSA (obstructive sleep apnea) 03/26/2016  . RBBB (right bundle branch block)   . S/P drug eluting coronary stent placement    11-26-2001   X2 DES  TO LAD &  BMS  . Schatzki's ring   . Status post dilation of esophageal narrowing    Current Outpatient Prescriptions on File Prior to Visit  Medication Sig Dispense Refill  . amLODipine (NORVASC) 10 MG tablet Take 10 mg by mouth every morning.    Marland Kitchen aspirin 325 MG EC tablet Take 325 mg  by mouth daily.    Marland Kitchen atorvastatin (LIPITOR) 80 MG tablet Take 1 tablet (80 mg total) by mouth daily at 6 PM. 30 tablet 6  . benzonatate (TESSALON) 100 MG capsule Take 1 capsule (100 mg total) by mouth every 8 (eight) hours. 21 capsule 0  . docusate sodium 100 MG CAPS Take 100 mg by mouth 2 (two) times daily. 30 capsule 0  . esomeprazole (NEXIUM) 40 MG capsule Take 40 mg by mouth every morning.    . hydrochlorothiazide (MICROZIDE) 12.5 MG capsule Take 1 capsule (12.5 mg total) by mouth every morning. 30 capsule 11  . irbesartan (AVAPRO) 300 MG tablet take 1 tablet by mouth once daily 90 tablet 3  . meloxicam (MOBIC) 7.5 MG tablet Take 7.5 mg by mouth daily.   1  . metoprolol succinate (TOPROL-XL) 100 MG 24 hr tablet Take 1 tablet (100 mg total) by mouth daily. 90 tablet 3   No current facility-administered medications on file prior to visit.      Review of Systems .Constitutional:   No  weight loss, night sweats,  Fevers, chills, fatigue, or  lassitude.  HEENT:   No headaches,  Difficulty swallowing,  Tooth/dental problems, or  Sore throat,  No sneezing, itching, ear ache, nasal congestion, post nasal drip,   CV:  No chest pain,  Orthopnea, PND, swelling in lower extremities, anasarca, dizziness, palpitations, syncope.   GI  No heartburn, indigestion, abdominal pain, nausea, vomiting, diarrhea, change in bowel habits, loss of appetite, bloody stools.   Resp: No shortness of breath with exertion or at rest.  No excess mucus, no productive cough,  No non-productive cough,  No coughing up of blood.  No change in color of mucus.  No wheezing.  No chest wall deformity  Skin: no rash or lesions.  GU: no dysuria, change in color of urine, no urgency or frequency.  No flank pain, no hematuria   MS:  No joint pain or swelling.  No decreased range of motion.  No back pain.  Psych:  No change in mood or affect. No depression or anxiety.  No memory loss.         Objective:    Physical Exam Vitals:   04/09/16 1121  BP: 124/88  Pulse: (!) 55  Temp: 98.4 F (36.9 C)  TempSrc: Oral  SpO2: 98%  Weight: (!) 310 lb (140.6 kg)  Height: 6\' 2"  (1.88 m)    Body mass index is 39.8 kg/m.  GEN: A/Ox3; pleasant , NAD, obese    HEENT:  [place]/AT,  EACs-clear, TMs-wnl, NOSE-clear, THROAT-clear, no lesions, no postnasal drip or exudate noted. Class 2-3 MP airway   NECK:  Supple w/ fair ROM; no JVD; normal carotid impulses w/o bruits; no thyromegaly or nodules palpated; no lymphadenopathy.    RESP  Clear  P & A; w/o, wheezes/ rales/ or rhonchi. no accessory muscle use, no dullness to percussion  CARD:  RRR, no m/r/g  , tr  peripheral edema, pulses intact, no cyanosis or clubbing.  GI:   Soft & nt; nml bowel sounds; no organomegaly or masses detected.   Musco: Warm bil, no deformities or joint swelling noted.   Neuro: alert, no focal deficits noted.    Skin: Warm, no lesions or rashes  Tammy Parrett NP-C  [facility]  04/09/2016

## 2016-04-09 NOTE — Progress Notes (Signed)
Reviewed and agree with assessment/plan.  Chesley Mires, MD [facility] Pulmonary/Critical Care 04/09/2016, 11:54 AM Pager:  [phone]

## 2016-04-09 NOTE — Patient Instructions (Signed)
Begin CPAP At bedtime  .  Order for Mini CPAP to DME.  Wear for at least 4-6 hr each night . Do not drive if sleepy.  Work on weight loss.  CPAP .download in 4 weeks .  Follow up Dr. Halford Chessman  In 2 months and As needed

## 2016-04-09 NOTE — Addendum Note (Signed)
Addended by: Osa Craver on: 04/09/2016 11:38 AM   Modules accepted: Orders

## 2016-05-14 ENCOUNTER — Encounter: Payer: Self-pay | Admitting: Internal Medicine

## 2016-06-07 ENCOUNTER — Encounter: Payer: Self-pay | Admitting: Pulmonary Disease

## 2016-06-08 ENCOUNTER — Ambulatory Visit: Payer: Medicare PPO | Admitting: Pulmonary Disease

## 2016-08-25 ENCOUNTER — Other Ambulatory Visit: Payer: Self-pay | Admitting: Cardiology

## 2016-08-27 NOTE — Telephone Encounter (Signed)
Rx(s) sent to pharmacy electronically.  

## 2016-11-24 ENCOUNTER — Encounter: Payer: Self-pay | Admitting: Cardiology

## 2016-12-21 NOTE — Progress Notes (Signed)
HPI: FU CAD s/p DES to LAD and BMS to Dx in 2003, HL, HTN, AFlutter (transient on treadmill - declined coumadin). Since last seen, patient denies dyspnea, chest pain, palpitations or syncope.    Studies: - LHC (10/2001): Mid LAD 70%, Ostial D1 80%, CFX and RCA normal, EF 65%. PCI: Cypher DES to LAD and Express BMS to Dx.  - Nuclear (6/15): No ischemia, EF 65%, Nomal  - Abdominal ultrasound (6/17): Abdominal aortic aneurysm measuring 2.9 x 3.6 cm.  Current Outpatient Prescriptions  Medication Sig Dispense Refill  . amLODipine (NORVASC) 10 MG tablet Take 10 mg by mouth every morning.    Marland Kitchen aspirin 325 MG EC tablet Take 325 mg by mouth daily.    Marland Kitchen atorvastatin (LIPITOR) 80 MG tablet take 1 tablet by mouth once daily AT 6PM 90 tablet 0  . benzonatate (TESSALON) 100 MG capsule Take 1 capsule (100 mg total) by mouth every 8 (eight) hours. 21 capsule 0  . docusate sodium 100 MG CAPS Take 100 mg by mouth 2 (two) times daily. 30 capsule 0  . esomeprazole (NEXIUM) 40 MG capsule Take 40 mg by mouth every morning.    . hydrochlorothiazide (MICROZIDE) 12.5 MG capsule Take 1 capsule (12.5 mg total) by mouth every morning. 30 capsule 11  . irbesartan (AVAPRO) 300 MG tablet take 1 tablet by mouth once daily 90 tablet 3  . meloxicam (MOBIC) 7.5 MG tablet Take 7.5 mg by mouth daily.   1  . metoprolol succinate (TOPROL-XL) 100 MG 24 hr tablet Take 1 tablet (100 mg total) by mouth daily. 90 tablet 3   No current facility-administered medications for this visit.      Past Medical History:  Diagnosis Date  . Arthritis   . BPH (benign prostatic hypertrophy)   . CAD (coronary artery disease)    a. Lexiscan Myoview (11/2013):  Normal stress nuclear study.  LV Ejection Fraction: 65%  . Complication of anesthesia    hx PONV  . Degenerative joint disease   . GERD (gastroesophageal reflux disease)   . H/O hiatal hernia   . History of atrial flutter    REMOTE HX ON TREADMILL  . History of  colon polyps   . Hyperlipidemia   . Hypertension   . IBS (irritable bowel syndrome)   . Lower urinary tract symptoms (LUTS)   . OSA (obstructive sleep apnea) 03/26/2016  . RBBB (right bundle branch block)   . S/P drug eluting coronary stent placement    11-26-2001   X2 DES  TO LAD &  BMS  . Schatzki's ring   . Status post dilation of esophageal narrowing     Past Surgical History:  Procedure Laterality Date  . APPENDECTOMY    . CARDIOVASCULAR STRESS TEST  12-09-2013   NUCLEAR LEXISCAN STUDY--  RESULTS PENDING  . CARDIOVASCULAR STRESS TEST  04/2011  DR Haydon Dorris   NORMAL /  NO ISCHEMIA/  EF 67%  . CHOLECYSTECTOMY    . COLONOSCOPY  05/29/2011   RMR: tubular adenoma  . CORONARY ANGIOPLASTY WITH STENT PLACEMENT  11-26-2001  DR Eustace Quail   DES (Cypher) to LAD 70%/  BMS (Express) to OSTIAL D1 80%/   CFX  &  RCA normal/  EF 65%  . ESOPHAGOGASTRODUODENOSCOPY  04/05/2004   RMR: Small hiatal hernia/Questionable subtle Schatzki's ring, status post dilation as described  . ESOPHAGOGASTRODUODENOSCOPY  05/2011   RMR: small hiatal hernia, empiric dilation with 52F and 42F Maloney  . ESOPHAGOGASTRODUODENOSCOPY (  EGD) WITH ESOPHAGEAL DILATION N/A 01/16/2013   Dr. Rourk:baggy somewhat atonic esophagus-query underlying esophageal motility disorder. Status post passage of a Maloney dilator (large bore) empirically. Status post esophageal biopsy/Reflux symptoms are well controlled on Nexium.benign path  . TOTAL HIP ARTHROPLASTY Left 09-07-2003  . TOTAL KNEE ARTHROPLASTY Left 07-27-2002  . TRANSURETHRAL RESECTION OF PROSTATE N/A 12/21/2013   Procedure: TRANSURETHRAL RESECTION OF THE PROSTATE WITH GYRUS INSTRUMENTS;  Surgeon: Jorja Loa, MD;  Location: [facility];  Service: Urology;  Laterality: N/A;    Social History   Social History  . Marital status: Married    Spouse name: N/A  . Number of children: N/A  . Years of education: N/A   Occupational History  . retired     Social History Main Topics  . Smoking status: Never Smoker  . Smokeless tobacco: Never Used  . Alcohol use 0.0 oz/week     Comment: on holidays  . Drug use: No  . Sexual activity: No   Other Topics Concern  . Not on file   Social History Narrative   Retired from [employer]    Family History  Problem Relation Age of Onset  . Cancer Father        Lung  . Hypertension Mother   . Coronary artery disease Mother   . Kidney failure Mother   . Cancer Sister        breast carcinoma  . Hypertension Sister   . Colon cancer Neg Hx     ROS: Significant arthralgias but no fevers or chills, productive cough, hemoptysis, dysphasia, odynophagia, melena, hematochezia, dysuria, hematuria, rash, seizure activity, orthopnea, PND, pedal edema, claudication. Remaining systems are negative.  Physical Exam: Well-developed obese in no acute distress.  Skin is warm and dry.  HEENT is normal.  Neck is supple.  Chest is clear to auscultation with normal expansion.  Cardiovascular exam is regular rate and rhythm.  Abdominal exam nontender or distended. No masses palpated. Extremities show no edema. neuro grossly intact  ECG- Sinus bradycardia at a rate of 50. Right bundle branch block. personally reviewed  A/P  1 coronary artery disease-continue aspirin and statin.  2 hypertension-blood pressure is controlled. Continue present medications. Check potassium and renal function.  3 atrial flutter-1 isolated episode documented on previous treadmill. Continue beta blocker. He has declined Coumadin previously and understands risk of stroke.  4 hyperlipidemia-continue statin. Check lipids and liver.   5 abdominal aortic aneurysm-plan repeat study.  Kirk Ruths, MD

## 2016-12-27 ENCOUNTER — Ambulatory Visit (INDEPENDENT_AMBULATORY_CARE_PROVIDER_SITE_OTHER): Payer: Medicare PPO | Admitting: Cardiology

## 2016-12-27 ENCOUNTER — Encounter: Payer: Self-pay | Admitting: Cardiology

## 2016-12-27 VITALS — BP 128/80 | HR 50 | Ht 74.0 in | Wt 311.2 lb

## 2016-12-27 DIAGNOSIS — I714 Abdominal aortic aneurysm, without rupture, unspecified: Secondary | ICD-10-CM

## 2016-12-27 DIAGNOSIS — I2510 Atherosclerotic heart disease of native coronary artery without angina pectoris: Secondary | ICD-10-CM | POA: Diagnosis not present

## 2016-12-27 DIAGNOSIS — E7800 Pure hypercholesterolemia, unspecified: Secondary | ICD-10-CM

## 2016-12-27 DIAGNOSIS — I10 Essential (primary) hypertension: Secondary | ICD-10-CM

## 2016-12-27 LAB — BASIC METABOLIC PANEL
BUN/Creatinine Ratio: 11 (ref 10–24)
BUN: 18 mg/dL (ref 8–27)
CO2: 21 mmol/L (ref 20–29)
Calcium: 9.8 mg/dL (ref 8.6–10.2)
Chloride: 103 mmol/L (ref 96–106)
Creatinine, Ser: 1.59 mg/dL — ABNORMAL HIGH (ref 0.76–1.27)
GFR calc Af Amer: 48 mL/min/{1.73_m2} — ABNORMAL LOW (ref >59)
GFR, EST NON AFRICAN AMERICAN: 42 mL/min/{1.73_m2} — AB (ref >59)
GLUCOSE: 104 mg/dL — AB (ref 65–99)
POTASSIUM: 5.1 mmol/L (ref 3.5–5.2)
SODIUM: 140 mmol/L (ref 134–144)

## 2016-12-27 LAB — HEPATIC FUNCTION PANEL
ALBUMIN: 4.1 g/dL (ref 3.5–4.8)
ALK PHOS: 131 IU/L — AB (ref 39–117)
ALT: 15 IU/L (ref 0–44)
AST: 26 IU/L (ref 0–40)
Bilirubin Total: 0.3 mg/dL (ref 0.0–1.2)
Bilirubin, Direct: 0.1 mg/dL (ref 0.00–0.40)
Total Protein: 6.8 g/dL (ref 6.0–8.5)

## 2016-12-27 LAB — LIPID PANEL
CHOLESTEROL TOTAL: 115 mg/dL (ref 100–199)
Chol/HDL Ratio: 2.7 ratio (ref 0.0–5.0)
HDL: 42 mg/dL (ref >39)
LDL Calculated: 60 mg/dL (ref 0–99)
Triglycerides: 66 mg/dL (ref 0–149)
VLDL Cholesterol Cal: 13 mg/dL (ref 5–40)

## 2016-12-27 MED ORDER — METOPROLOL SUCCINATE ER 100 MG PO TB24: 100.0000 mg | ORAL_TABLET | Freq: Every day | ORAL | 3 refills | Status: DC

## 2016-12-27 MED ORDER — IRBESARTAN 300 MG PO TABS: 300.0000 mg | ORAL_TABLET | Freq: Every day | ORAL | 3 refills | Status: DC

## 2016-12-27 MED ORDER — ATORVASTATIN CALCIUM 80 MG PO TABS: 80.0000 mg | ORAL_TABLET | Freq: Every day | ORAL | 3 refills | Status: DC

## 2016-12-27 MED ORDER — AMLODIPINE BESYLATE 10 MG PO TABS: 10.0000 mg | ORAL_TABLET | Freq: Every morning | ORAL | 3 refills | Status: DC

## 2016-12-27 NOTE — Patient Instructions (Signed)
Medication Instructions:   NO CHANGE=Refill sent to the pharmacy electronically.   Labwork:  Your physician recommends that you HAVE LAB WORK TODAY  Testing/Procedures:  Your physician has requested that you have an abdominal aorta duplex. During this test, an ultrasound is used to evaluate the aorta. Allow 30 minutes for this exam. Do not eat after midnight the day before and avoid carbonated beverages   Follow-Up:  Your physician wants you to follow-up in: [place] will receive a reminder letter in the mail two months in advance. If you don't receive a letter, please call our office to schedule the follow-up appointment.   If you need a refill on your cardiac medications before your next appointment, please call your pharmacy.

## 2017-01-16 ENCOUNTER — Ambulatory Visit (HOSPITAL_COMMUNITY)
Admission: RE | Admit: 2017-01-16 | Discharge: 2017-01-16 | Disposition: A | Discharge status: Home / Self Care | Payer: Medicare PPO | Source: Ambulatory Visit | Attending: Cardiology | Admitting: Cardiology

## 2017-01-16 DIAGNOSIS — I714 Abdominal aortic aneurysm, without rupture, unspecified: Secondary | ICD-10-CM

## 2017-03-07 ENCOUNTER — Encounter (HOSPITAL_COMMUNITY): Payer: Self-pay | Admitting: Cardiovascular Disease

## 2017-03-07 ENCOUNTER — Inpatient Hospital Stay (HOSPITAL_COMMUNITY)
Admission: AD | Admit: 2017-03-07 | Discharge: 2017-03-09 | DRG: 281 | Disposition: A | Discharge status: Home / Self Care | Payer: Medicare PPO | Attending: Cardiovascular Disease | Admitting: Cardiovascular Disease

## 2017-03-07 ENCOUNTER — Encounter (HOSPITAL_COMMUNITY)
Admission: AD | Disposition: A | Discharge status: Home / Self Care | Payer: Self-pay | Source: Home / Self Care | Attending: Cardiovascular Disease

## 2017-03-07 DIAGNOSIS — I10 Essential (primary) hypertension: Secondary | ICD-10-CM | POA: Diagnosis present

## 2017-03-07 DIAGNOSIS — E7800 Pure hypercholesterolemia, unspecified: Secondary | ICD-10-CM | POA: Diagnosis not present

## 2017-03-07 DIAGNOSIS — Z9889 Other specified postprocedural states: Secondary | ICD-10-CM | POA: Diagnosis not present

## 2017-03-07 DIAGNOSIS — I2109 ST elevation (STEMI) myocardial infarction involving other coronary artery of anterior wall: Secondary | ICD-10-CM

## 2017-03-07 DIAGNOSIS — E785 Hyperlipidemia, unspecified: Secondary | ICD-10-CM | POA: Diagnosis not present

## 2017-03-07 DIAGNOSIS — Z7982 Long term (current) use of aspirin: Secondary | ICD-10-CM

## 2017-03-07 DIAGNOSIS — K219 Gastro-esophageal reflux disease without esophagitis: Secondary | ICD-10-CM | POA: Diagnosis not present

## 2017-03-07 DIAGNOSIS — M1990 Unspecified osteoarthritis, unspecified site: Secondary | ICD-10-CM | POA: Diagnosis present

## 2017-03-07 DIAGNOSIS — I714 Abdominal aortic aneurysm, without rupture: Secondary | ICD-10-CM | POA: Diagnosis not present

## 2017-03-07 DIAGNOSIS — Z809 Family history of malignant neoplasm, unspecified: Secondary | ICD-10-CM

## 2017-03-07 DIAGNOSIS — R069 Unspecified abnormalities of breathing: Secondary | ICD-10-CM | POA: Diagnosis not present

## 2017-03-07 DIAGNOSIS — I214 Non-ST elevation (NSTEMI) myocardial infarction: Secondary | ICD-10-CM | POA: Diagnosis not present

## 2017-03-07 DIAGNOSIS — Z6838 Body mass index (BMI) 38.0-38.9, adult: Secondary | ICD-10-CM | POA: Diagnosis not present

## 2017-03-07 DIAGNOSIS — I2510 Atherosclerotic heart disease of native coronary artery without angina pectoris: Secondary | ICD-10-CM | POA: Diagnosis present

## 2017-03-07 DIAGNOSIS — I4510 Unspecified right bundle-branch block: Secondary | ICD-10-CM | POA: Diagnosis present

## 2017-03-07 DIAGNOSIS — Z9861 Coronary angioplasty status: Secondary | ICD-10-CM

## 2017-03-07 DIAGNOSIS — Z96642 Presence of left artificial hip joint: Secondary | ICD-10-CM | POA: Diagnosis present

## 2017-03-07 DIAGNOSIS — G4733 Obstructive sleep apnea (adult) (pediatric): Secondary | ICD-10-CM | POA: Diagnosis not present

## 2017-03-07 DIAGNOSIS — M25519 Pain in unspecified shoulder: Secondary | ICD-10-CM | POA: Diagnosis present

## 2017-03-07 DIAGNOSIS — R072 Precordial pain: Secondary | ICD-10-CM | POA: Diagnosis not present

## 2017-03-07 DIAGNOSIS — Z9049 Acquired absence of other specified parts of digestive tract: Secondary | ICD-10-CM | POA: Diagnosis not present

## 2017-03-07 DIAGNOSIS — Z96652 Presence of left artificial knee joint: Secondary | ICD-10-CM | POA: Diagnosis present

## 2017-03-07 DIAGNOSIS — E876 Hypokalemia: Secondary | ICD-10-CM | POA: Diagnosis present

## 2017-03-07 DIAGNOSIS — R42 Dizziness and giddiness: Secondary | ICD-10-CM | POA: Diagnosis not present

## 2017-03-07 DIAGNOSIS — Z8249 Family history of ischemic heart disease and other diseases of the circulatory system: Secondary | ICD-10-CM | POA: Diagnosis not present

## 2017-03-07 DIAGNOSIS — Z885 Allergy status to narcotic agent status: Secondary | ICD-10-CM

## 2017-03-07 DIAGNOSIS — I2102 ST elevation (STEMI) myocardial infarction involving left anterior descending coronary artery: Principal | ICD-10-CM | POA: Diagnosis present

## 2017-03-07 DIAGNOSIS — I4892 Unspecified atrial flutter: Secondary | ICD-10-CM | POA: Diagnosis present

## 2017-03-07 DIAGNOSIS — Z79899 Other long term (current) drug therapy: Secondary | ICD-10-CM | POA: Diagnosis not present

## 2017-03-07 DIAGNOSIS — Z955 Presence of coronary angioplasty implant and graft: Secondary | ICD-10-CM | POA: Diagnosis not present

## 2017-03-07 DIAGNOSIS — Z86010 Personal history of colonic polyps: Secondary | ICD-10-CM

## 2017-03-07 HISTORY — DX: ST elevation (STEMI) myocardial infarction involving other coronary artery of anterior wall: I21.09

## 2017-03-07 HISTORY — PX: CORONARY/GRAFT ACUTE MI REVASCULARIZATION: CATH118305

## 2017-03-07 HISTORY — PX: LEFT HEART CATH AND CORONARY ANGIOGRAPHY: CATH118249

## 2017-03-07 LAB — POCT I-STAT, CHEM 8
BUN: 15 mg/dL (ref 6–20)
CALCIUM ION: 1.19 mmol/L (ref 1.15–1.40)
CREATININE: 1.5 mg/dL — AB (ref 0.61–1.24)
Chloride: 106 mmol/L (ref 101–111)
Glucose, Bld: 100 mg/dL — ABNORMAL HIGH (ref 65–99)
HCT: 40 % (ref 39.0–52.0)
Hemoglobin: 13.6 g/dL (ref 13.0–17.0)
Potassium: 3.3 mmol/L — ABNORMAL LOW (ref 3.5–5.1)
Sodium: 142 mmol/L (ref 135–145)
TCO2: 22 mmol/L (ref 22–32)

## 2017-03-07 LAB — COMPREHENSIVE METABOLIC PANEL
ALT: 15 U/L — AB (ref 17–63)
AST: 25 U/L (ref 15–41)
Albumin: 3.5 g/dL (ref 3.5–5.0)
Alkaline Phosphatase: 107 U/L (ref 38–126)
Anion gap: 10 (ref 5–15)
BUN: 15 mg/dL (ref 6–20)
CHLORIDE: 109 mmol/L (ref 101–111)
CO2: 22 mmol/L (ref 22–32)
CREATININE: 1.59 mg/dL — AB (ref 0.61–1.24)
Calcium: 9 mg/dL (ref 8.9–10.3)
GFR calc non Af Amer: 41 mL/min — ABNORMAL LOW (ref >60)
GFR, EST AFRICAN AMERICAN: 47 mL/min — AB (ref >60)
Glucose, Bld: 96 mg/dL (ref 65–99)
Potassium: 3.4 mmol/L — ABNORMAL LOW (ref 3.5–5.1)
SODIUM: 141 mmol/L (ref 135–145)
Total Bilirubin: 0.6 mg/dL (ref 0.3–1.2)
Total Protein: 6.6 g/dL (ref 6.5–8.1)

## 2017-03-07 LAB — CBC
HCT: 39.2 % (ref 39.0–52.0)
Hemoglobin: 12.9 g/dL — ABNORMAL LOW (ref 13.0–17.0)
MCH: 26.7 pg (ref 26.0–34.0)
MCHC: 32.9 g/dL (ref 30.0–36.0)
MCV: 81 fL (ref 78.0–100.0)
PLATELETS: 184 10*3/uL (ref 150–400)
RBC: 4.84 MIL/uL (ref 4.22–5.81)
RDW: 17.1 % — AB (ref 11.5–15.5)
WBC: 8.3 10*3/uL (ref 4.0–10.5)

## 2017-03-07 LAB — LIPID PANEL
Cholesterol: 120 mg/dL (ref 0–200)
HDL: 36 mg/dL — ABNORMAL LOW (ref >40)
LDL CALC: 71 mg/dL (ref 0–99)
TRIGLYCERIDES: 67 mg/dL (ref <150)
Total CHOL/HDL Ratio: 3.3 RATIO
VLDL: 13 mg/dL (ref 0–40)

## 2017-03-07 LAB — PROTIME-INR
INR: 1.12
Prothrombin Time: 14.3 seconds (ref 11.4–15.2)

## 2017-03-07 LAB — POCT ACTIVATED CLOTTING TIME
Activated Clotting Time: 202 seconds
Activated Clotting Time: 351 seconds

## 2017-03-07 LAB — HEMOGLOBIN A1C
Hgb A1c MFr Bld: 6.2 % — ABNORMAL HIGH (ref 4.8–5.6)
Mean Plasma Glucose: 131.24 mg/dL

## 2017-03-07 LAB — GLUCOSE, CAPILLARY: Glucose-Capillary: 75 mg/dL (ref 65–99)

## 2017-03-07 LAB — TROPONIN I: Troponin I: 0.17 ng/mL (ref <0.03)

## 2017-03-07 LAB — APTT: APTT: 28 s (ref 24–36)

## 2017-03-07 LAB — MRSA PCR SCREENING: MRSA BY PCR: NEGATIVE

## 2017-03-07 SURGERY — LEFT HEART CATH AND CORONARY ANGIOGRAPHY
Anesthesia: LOCAL

## 2017-03-07 MED ORDER — ONDANSETRON HCL 4 MG/2ML IJ SOLN: 4.0000 mg | Freq: Four times a day (QID) | INTRAMUSCULAR | Status: DC | PRN

## 2017-03-07 MED ORDER — VERAPAMIL HCL 2.5 MG/ML IV SOLN
INTRAVENOUS | Status: DC | PRN
  Administered 2017-03-07 (×2): via INTRA_ARTERIAL

## 2017-03-07 MED ORDER — SODIUM CHLORIDE 0.9% FLUSH: 3.0000 mL | INTRAVENOUS | Status: DC | PRN

## 2017-03-07 MED ORDER — HYDRALAZINE HCL 20 MG/ML IJ SOLN: 5.0000 mg | INTRAMUSCULAR | Status: DC | PRN

## 2017-03-07 MED ORDER — TIROFIBAN (AGGRASTAT) BOLUS VIA INFUSION
INTRAVENOUS | Status: DC | PRN
  Administered 2017-03-07: 3527.5 ug via INTRAVENOUS

## 2017-03-07 MED ORDER — SODIUM CHLORIDE 0.9 % IV SOLN: 250.0000 mL | INTRAVENOUS | Status: DC | PRN

## 2017-03-07 MED ORDER — HEPARIN SODIUM (PORCINE) 1000 UNIT/ML IJ SOLN
INTRAMUSCULAR | Status: AC
  Filled 2017-03-07: qty 1

## 2017-03-07 MED ORDER — TICAGRELOR 90 MG PO TABS
ORAL_TABLET | ORAL | Status: AC
  Filled 2017-03-07: qty 2

## 2017-03-07 MED ORDER — BENZONATATE 100 MG PO CAPS
100.0000 mg | ORAL_CAPSULE | Freq: Three times a day (TID) | ORAL | Status: DC
Start: 2017-03-07 — End: 2017-03-09
  Administered 2017-03-07 – 2017-03-09 (×6): 100 mg via ORAL
  Filled 2017-03-07 (×6): qty 1

## 2017-03-07 MED ORDER — IOPAMIDOL (ISOVUE-370) INJECTION 76%
INTRAVENOUS | Status: AC
  Filled 2017-03-07: qty 125

## 2017-03-07 MED ORDER — ASPIRIN EC 81 MG PO TBEC
81.0000 mg | DELAYED_RELEASE_TABLET | Freq: Every day | ORAL | Status: DC
  Administered 2017-03-08 – 2017-03-09 (×2): 81 mg via ORAL
  Filled 2017-03-07 (×2): qty 1

## 2017-03-07 MED ORDER — TICAGRELOR 90 MG PO TABS
ORAL_TABLET | ORAL | Status: DC | PRN
  Administered 2017-03-07: 180 mg via ORAL

## 2017-03-07 MED ORDER — NITROGLYCERIN 0.4 MG SL SUBL: 0.4000 mg | SUBLINGUAL_TABLET | SUBLINGUAL | Status: DC | PRN

## 2017-03-07 MED ORDER — ACETAMINOPHEN 325 MG PO TABS
650.0000 mg | ORAL_TABLET | ORAL | Status: DC | PRN
  Administered 2017-03-07 – 2017-03-09 (×2): 650 mg via ORAL
  Filled 2017-03-07 (×3): qty 2

## 2017-03-07 MED ORDER — SODIUM CHLORIDE 0.9 % IV SOLN: INTRAVENOUS | Status: DC

## 2017-03-07 MED ORDER — HEPARIN (PORCINE) IN NACL 2-0.9 UNIT/ML-% IJ SOLN
INTRAMUSCULAR | Status: AC | PRN
  Administered 2017-03-07: 1000 mL

## 2017-03-07 MED ORDER — LABETALOL HCL 5 MG/ML IV SOLN: 10.0000 mg | INTRAVENOUS | Status: DC | PRN

## 2017-03-07 MED ORDER — FENTANYL CITRATE (PF) 100 MCG/2ML IJ SOLN
INTRAMUSCULAR | Status: AC
  Filled 2017-03-07: qty 2

## 2017-03-07 MED ORDER — TIROFIBAN HCL IN NACL 5-0.9 MG/100ML-% IV SOLN
INTRAVENOUS | Status: AC
  Filled 2017-03-07: qty 100

## 2017-03-07 MED ORDER — SODIUM CHLORIDE 0.9% FLUSH
3.0000 mL | Freq: Two times a day (BID) | INTRAVENOUS | Status: DC
  Administered 2017-03-08: 3 mL via INTRAVENOUS

## 2017-03-07 MED ORDER — IOPAMIDOL (ISOVUE-370) INJECTION 76%
INTRAVENOUS | Status: DC | PRN
  Administered 2017-03-07: 125 mL via INTRA_ARTERIAL

## 2017-03-07 MED ORDER — IRBESARTAN 300 MG PO TABS
300.0000 mg | ORAL_TABLET | Freq: Every day | ORAL | Status: DC
  Administered 2017-03-08 – 2017-03-09 (×2): 300 mg via ORAL
  Filled 2017-03-07 (×2): qty 1

## 2017-03-07 MED ORDER — HEPARIN (PORCINE) IN NACL 2-0.9 UNIT/ML-% IJ SOLN
INTRAMUSCULAR | Status: AC
  Filled 2017-03-07: qty 1000

## 2017-03-07 MED ORDER — PANTOPRAZOLE SODIUM 40 MG PO TBEC
40.0000 mg | DELAYED_RELEASE_TABLET | Freq: Every day | ORAL | Status: DC
  Administered 2017-03-07 – 2017-03-09 (×3): 40 mg via ORAL
  Filled 2017-03-07 (×3): qty 1

## 2017-03-07 MED ORDER — TIROFIBAN HCL IN NACL 5-0.9 MG/100ML-% IV SOLN
INTRAVENOUS | Status: DC | PRN
  Administered 2017-03-07: 0.15 ug/kg/min via INTRAVENOUS
  Administered 2017-03-07: 19:00:00

## 2017-03-07 MED ORDER — HYDROCHLOROTHIAZIDE 12.5 MG PO CAPS
12.5000 mg | ORAL_CAPSULE | Freq: Every morning | ORAL | Status: DC
  Administered 2017-03-08 – 2017-03-09 (×2): 12.5 mg via ORAL
  Filled 2017-03-07 (×2): qty 1

## 2017-03-07 MED ORDER — MIDAZOLAM HCL 2 MG/2ML IJ SOLN
INTRAMUSCULAR | Status: DC | PRN
  Administered 2017-03-07: 2 mg via INTRAVENOUS

## 2017-03-07 MED ORDER — TICAGRELOR 90 MG PO TABS
90.0000 mg | ORAL_TABLET | Freq: Two times a day (BID) | ORAL | Status: DC
  Administered 2017-03-08 – 2017-03-09 (×3): 90 mg via ORAL
  Filled 2017-03-07 (×3): qty 1

## 2017-03-07 MED ORDER — GUAIFENESIN-DM 100-10 MG/5ML PO SYRP
5.0000 mL | ORAL_SOLUTION | ORAL | Status: DC | PRN
  Administered 2017-03-07: 5 mL via ORAL
  Filled 2017-03-07: qty 5

## 2017-03-07 MED ORDER — METOPROLOL SUCCINATE ER 100 MG PO TB24
100.0000 mg | ORAL_TABLET | Freq: Every day | ORAL | Status: DC
  Administered 2017-03-07 – 2017-03-09 (×3): 100 mg via ORAL
  Filled 2017-03-07: qty 1
  Filled 2017-03-07 (×2): qty 2

## 2017-03-07 MED ORDER — FENTANYL CITRATE (PF) 100 MCG/2ML IJ SOLN
INTRAMUSCULAR | Status: DC | PRN
  Administered 2017-03-07: 25 ug via INTRAVENOUS

## 2017-03-07 MED ORDER — VERAPAMIL HCL 2.5 MG/ML IV SOLN
INTRAVENOUS | Status: AC
  Filled 2017-03-07: qty 2

## 2017-03-07 MED ORDER — LIDOCAINE HCL (PF) 1 % IJ SOLN
INTRAMUSCULAR | Status: DC | PRN
  Administered 2017-03-07: 2 mL

## 2017-03-07 MED ORDER — HEPARIN SODIUM (PORCINE) 1000 UNIT/ML IJ SOLN
INTRAMUSCULAR | Status: DC | PRN
  Administered 2017-03-07: 4000 [IU] via INTRAVENOUS
  Administered 2017-03-07: 7000 [IU] via INTRAVENOUS

## 2017-03-07 MED ORDER — DOCUSATE SODIUM 100 MG PO CAPS
100.0000 mg | ORAL_CAPSULE | Freq: Two times a day (BID) | ORAL | Status: DC
  Administered 2017-03-08 – 2017-03-09 (×3): 100 mg via ORAL
  Filled 2017-03-07 (×3): qty 1

## 2017-03-07 MED ORDER — IOPAMIDOL (ISOVUE-370) INJECTION 76%
INTRAVENOUS | Status: AC
Start: 2017-03-07 — End: ?
  Filled 2017-03-07: qty 100

## 2017-03-07 MED ORDER — MIDAZOLAM HCL 2 MG/2ML IJ SOLN
INTRAMUSCULAR | Status: AC
  Filled 2017-03-07: qty 2

## 2017-03-07 MED ORDER — AMLODIPINE BESYLATE 10 MG PO TABS
10.0000 mg | ORAL_TABLET | Freq: Every morning | ORAL | Status: DC
  Administered 2017-03-08 – 2017-03-09 (×2): 10 mg via ORAL
  Filled 2017-03-07 (×2): qty 1

## 2017-03-07 MED ORDER — ATORVASTATIN CALCIUM 80 MG PO TABS
80.0000 mg | ORAL_TABLET | Freq: Every day | ORAL | Status: DC
  Administered 2017-03-08: 80 mg via ORAL
  Filled 2017-03-07: qty 1

## 2017-03-07 MED ORDER — LIDOCAINE HCL (PF) 1 % IJ SOLN
INTRAMUSCULAR | Status: AC
  Filled 2017-03-07: qty 30

## 2017-03-07 MED ORDER — HEPARIN SODIUM (PORCINE) 5000 UNIT/ML IJ SOLN
5000.0000 [IU] | Freq: Three times a day (TID) | INTRAMUSCULAR | Status: DC
  Administered 2017-03-08 – 2017-03-09 (×3): 5000 [IU] via SUBCUTANEOUS
  Filled 2017-03-07 (×3): qty 1

## 2017-03-07 SURGICAL SUPPLY — 20 items
BALLN ~~LOC~~ EUPHORA RX 3.5X8 (BALLOONS) ×2
BALLOON ~~LOC~~ EUPHORA RX 3.5X8 (BALLOONS) IMPLANT
CATH INFINITI 5 FR JL3.5 (CATHETERS) ×1 IMPLANT
CATH INFINITI 5FR AL1 (CATHETERS) ×1 IMPLANT
CATH INFINITI 5FR ANG PIGTAIL (CATHETERS) ×1 IMPLANT
CATH INFINITI JR4 5F (CATHETERS) ×1 IMPLANT
CATH LAUNCHER 5F JR4 (CATHETERS) ×1 IMPLANT
CATH VISTA GUIDE 6FR XBLAD3.5 (CATHETERS) ×1 IMPLANT
DEVICE RAD COMP TR BAND LRG (VASCULAR PRODUCTS) ×1 IMPLANT
ELECT DEFIB PAD ADLT CADENCE (PAD) ×1 IMPLANT
GLIDESHEATH SLEND SS 6F .021 (SHEATH) ×1 IMPLANT
GUIDEWIRE INQWIRE 1.5J.035X260 (WIRE) IMPLANT
HOVERMATT SINGLE USE (MISCELLANEOUS) ×1 IMPLANT
INQWIRE 1.5J .035X260CM (WIRE) ×2
KIT HEART LEFT (KITS) ×2 IMPLANT
PACK CARDIAC CATHETERIZATION (CUSTOM PROCEDURE TRAY) ×2 IMPLANT
SYR MEDRAD MARK V 150ML (SYRINGE) ×1 IMPLANT
TRANSDUCER W/STOPCOCK (MISCELLANEOUS) ×2 IMPLANT
TUBING CIL FLEX 10 FLL-RA (TUBING) ×2 IMPLANT
WIRE COUGAR XT STRL 190CM (WIRE) ×1 IMPLANT

## 2017-03-07 NOTE — H&P (Signed)
Cardiology Admission History and Physical:   Patient ID: MONICA ZAHLER; MRN: [id number]; DOB: June 08, 1941   Admission date: (Not on file)  Primary Care Provider: [name], MD Primary Cardiologist: Dr. Stanford Breed  Chief Complaint:  Chest pain/STEMI  Patient Profile:   RAYNELL SCOTT is a 76 y.o. male with a history of CAD s/p DES to LAD and BMS to Dx in 2003, HL, HTN, AFlutter (transient on treadmill - declined coumadin) and AAA presented by code STEMI by EMS.   Recent abdominal ultra sound 01/16/17 showed AAA measuring 3.3x3.2cm.   History of Present Illness:   Mr. Thorstenson was riding lawn for 2 hours before suddenly fell down. Could not stand up. He then had substernal chest pressure around 4:45 pm with SOB and diaphoresis. EMS activated by wife. By the time, EMS Arrived his chest pain was subsided. No SL nitro given. He was given ASA 324mg  En route. Compliant with medications.    Past Medical History:  Diagnosis Date  . Arthritis   . BPH (benign prostatic hypertrophy)   . CAD (coronary artery disease)    a. Lexiscan Myoview (11/2013):  Normal stress nuclear study.  LV Ejection Fraction: 65%  . Complication of anesthesia    hx PONV  . Degenerative joint disease   . GERD (gastroesophageal reflux disease)   . H/O hiatal hernia   . History of atrial flutter    REMOTE HX ON TREADMILL  . History of colon polyps   . Hyperlipidemia   . Hypertension   . IBS (irritable bowel syndrome)   . Lower urinary tract symptoms (LUTS)   . OSA (obstructive sleep apnea) 03/26/2016  . RBBB (right bundle branch block)   . S/P drug eluting coronary stent placement    11-26-2001   X2 DES  TO LAD &  BMS  . Schatzki's ring   . Status post dilation of esophageal narrowing     Past Surgical History:  Procedure Laterality Date  . APPENDECTOMY    . CARDIOVASCULAR STRESS TEST  12-09-2013   NUCLEAR LEXISCAN STUDY--  RESULTS PENDING  . CARDIOVASCULAR STRESS TEST  04/2011  DR CRENSHAW   NORMAL /  NO ISCHEMIA/  EF 67%  . CHOLECYSTECTOMY    . COLONOSCOPY  05/29/2011   RMR: tubular adenoma  . CORONARY ANGIOPLASTY WITH STENT PLACEMENT  11-26-2001  DR Eustace Quail   DES ([facility]) to LAD 70%/  BMS ([facility]) to OSTIAL D1 80%/   CFX  &  RCA normal/  EF 65%  . ESOPHAGOGASTRODUODENOSCOPY  04/05/2004   RMR: Small hiatal hernia/Questionable subtle Schatzki's ring, status post dilation as described  . ESOPHAGOGASTRODUODENOSCOPY  05/2011   RMR: small hiatal hernia, empiric dilation with 94F and 53F Maloney  . ESOPHAGOGASTRODUODENOSCOPY (EGD) WITH ESOPHAGEAL DILATION N/A 01/16/2013   Dr. Rourk:baggy somewhat atonic esophagus-query underlying esophageal motility disorder. Status post passage of a Maloney dilator (large bore) empirically. Status post esophageal biopsy/Reflux symptoms are well controlled on Nexium.benign path  . TOTAL HIP ARTHROPLASTY Left 09-07-2003  . TOTAL KNEE ARTHROPLASTY Left 07-27-2002  . TRANSURETHRAL RESECTION OF PROSTATE N/A 12/21/2013   Procedure: TRANSURETHRAL RESECTION OF THE PROSTATE WITH GYRUS INSTRUMENTS;  Surgeon: Jorja Loa, MD;  Location: [facility];  Service: Urology;  Laterality: N/A;     Medications Prior to Admission: Prior to Admission medications   Medication Sig Start Date End Date Taking? Authorizing Provider  amLODipine (NORVASC) 10 MG tablet Take 1 tablet (10 mg total) by mouth every morning. 12/27/16  Lelon Perla, MD  aspirin 325 MG EC tablet Take 325 mg by mouth daily.    [provider]  atorvastatin (LIPITOR) 80 MG tablet Take 1 tablet (80 mg total) by mouth daily at 6 PM. 12/27/16   Crenshaw, Denice Bors, MD  benzonatate (TESSALON) 100 MG capsule Take 1 capsule (100 mg total) by mouth every 8 (eight) hours. 02/02/15   Orpah Greek, MD  docusate sodium 100 MG CAPS Take 100 mg by mouth 2 (two) times daily. 12/22/13   Franchot Gallo, MD  esomeprazole (NEXIUM) 40 MG capsule Take 40 mg by mouth every  morning.    [provider]  hydrochlorothiazide (MICROZIDE) 12.5 MG capsule Take 1 capsule (12.5 mg total) by mouth every morning. 12/20/15   Lelon Perla, MD  irbesartan (AVAPRO) 300 MG tablet Take 1 tablet (300 mg total) by mouth daily. 12/27/16   Lelon Perla, MD  meloxicam (MOBIC) 7.5 MG tablet Take 7.5 mg by mouth daily.  09/16/14   [provider]  metoprolol succinate (TOPROL-XL) 100 MG 24 hr tablet Take 1 tablet (100 mg total) by mouth daily. 12/27/16   Lelon Perla, MD     Allergies:    Allergies  Allergen Reactions  . Codeine Nausea And Vomiting    Social History:   Social History   Social History  . Marital status: Married    Spouse name: N/A  . Number of children: N/A  . Years of education: N/A   Occupational History  . retired    Social History Main Topics  . Smoking status: Never Smoker  . Smokeless tobacco: Never Used  . Alcohol use 0.0 oz/week     Comment: on holidays  . Drug use: No  . Sexual activity: No   Other Topics Concern  . Not on file   Social History Narrative   Retired from [employer]    Family History:   The patient's family history includes Cancer in his father and sister; Coronary artery disease in his mother; Hypertension in his mother and sister; Kidney failure in his mother. There is no history of Colon cancer.    ROS:  Please see the history of present illness.  All other ROS reviewed and negative.     Physical Exam/Data:  There were no vitals filed for this visit. No intake or output data in the 24 hours ending 03/07/17 1719 There were no vitals filed for this visit. There is no height or weight on file to calculate BMI.  General:  Well nourished, well developed, in no acute distress HEENT: normal Lymph: no adenopathy Neck: no JVD Endocrine:  No thryomegaly Vascular: No carotid bruits; FA pulses 2+ bilaterally without bruits  Cardiac:  normal S1, S2; RRR; no murmur  Lungs:  clear to  auscultation bilaterally, no wheezing, rhonchi or rales  Abd: soft, nontender, no hepatomegaly  Ext: no edema Musculoskeletal:  No deformities, BUE and BLE strength normal and equal Skin: warm and dry  Neuro:  CNs 2-12 intact, no focal abnormalities noted Psych:  Normal affect    EKG:  The ECG that was done was personally reviewed and demonstrates Sr with anterior STEMI   Laboratory Data:   Radiology/Studies:  No results found.  Assessment and Plan:   1. STEMI - Pending emergent cath.  2. CAD s/p DES to LAD and BMS to Dx in 2003  Severity of Illness: The appropriate patient status for this patient is INPATIENT. Inpatient status is judged  to be reasonable and necessary in order to provide the required intensity of service to ensure the patient's safety. The patient's presenting symptoms, physical exam findings, and initial radiographic and laboratory data in the context of their chronic comorbidities is felt to place them at high risk for further clinical deterioration. Furthermore, it is not anticipated that the patient will be medically stable for discharge from the hospital within 2 midnights of admission. The following factors support the patient status of inpatient.   " The patient's presenting symptoms include --  Fall and chest pain  " The worrisome physical exam findings include Obesity and severe back pain  " The initial radiographic and laboratory data are worrisome because of STEMI " The chronic co-morbidities include hx of CAD and aflutter    * I certify that at the point of admission it is my clinical judgment that the patient will require inpatient hospital care spanning beyond 2 midnights from the point of admission due to high intensity of service, high risk for further deterioration and high frequency of surveillance required.Jarrett Soho, PA  03/07/2017 5:19 PM   Patient seen, examined. Available data reviewed. Agree with findings, assessment,  and plan as outlined by Robbie Lis, PA-C. This patient with known coronary artery disease and remote stenting of the LAD and first diagonal branches presents after an episode of presyncope followed by chest pain. EMS was called and his EKG demonstrates a chronic right bundle branch block with ST/T-wave abnormalities highly suggestive of an anterior STEMI. The patient is chest pain-free on arrival. On exam he is a pleasant obese male in no distress. Lungs are clear. JVP is normal. Heart is regular rate and rhythm with no murmur or gallop. Abdomen is soft, obese, nontender. Extremities show no edema. Plan to proceed with emergency cardiac catheterization and possible PCI as above. Patient presented directly to the cardiac catheterization lab by [facility]. Emergency implied consent is obtained.  Sherren Mocha, M.D. 03/11/2017 11:59 AM

## 2017-03-07 NOTE — Progress Notes (Signed)
Troponin from 18:10 is 0.17. Expected value. Will continue to monitor.

## 2017-03-08 ENCOUNTER — Encounter (HOSPITAL_COMMUNITY): Payer: Self-pay | Admitting: Cardiovascular Disease

## 2017-03-08 ENCOUNTER — Inpatient Hospital Stay (HOSPITAL_COMMUNITY): Payer: Medicare PPO

## 2017-03-08 DIAGNOSIS — E785 Hyperlipidemia, unspecified: Secondary | ICD-10-CM

## 2017-03-08 DIAGNOSIS — R072 Precordial pain: Secondary | ICD-10-CM

## 2017-03-08 DIAGNOSIS — I10 Essential (primary) hypertension: Secondary | ICD-10-CM

## 2017-03-08 LAB — CBC
HCT: 38.6 % — ABNORMAL LOW (ref 39.0–52.0)
HEMOGLOBIN: 12.6 g/dL — AB (ref 13.0–17.0)
MCH: 26.5 pg (ref 26.0–34.0)
MCHC: 32.6 g/dL (ref 30.0–36.0)
MCV: 81.1 fL (ref 78.0–100.0)
PLATELETS: 185 10*3/uL (ref 150–400)
RBC: 4.76 MIL/uL (ref 4.22–5.81)
RDW: 17.5 % — ABNORMAL HIGH (ref 11.5–15.5)
WBC: 8.1 10*3/uL (ref 4.0–10.5)

## 2017-03-08 LAB — TROPONIN I
Troponin I: 3.61 ng/mL (ref <0.03)
Troponin I: 3.36 ng/mL (ref <0.03)

## 2017-03-08 LAB — ECHOCARDIOGRAM COMPLETE
Ao-asc: 35 cm
CHL CUP DOP CALC LVOT VTI: 22.6 cm
E decel time: 324 msec
EERAT: 5.98
FS: 30 % (ref 28–44)
HEIGHTINCHES: 74 in
IVS/LV PW RATIO, ED: 0.89
LA ID, A-P, ES: 28 mm
LA diam end sys: 28 mm
LA vol A4C: 52.8 ml
LA vol index: 20 mL/m2
LA vol: 51.9 mL
LADIAMINDEX: 1.08 cm/m2
LDCA: 3.46 cm2
LV E/e' medial: 5.98
LV E/e'average: 5.98
LV TDI E'LATERAL: 9.57
LV TDI E'MEDIAL: 5.98
LVELAT: 9.57 cm/s
LVOT diameter: 21 mm
LVOTPV: 103 cm/s
LVOTSV: 78 mL
MV Dec: 324
MV pk A vel: 71.3 m/s
MV pk E vel: 57.2 m/s
PW: 12.4 mm — AB (ref 0.6–1.1)
RV LATERAL S' VELOCITY: 9.79 cm/s
TAPSE: 17.1 mm
WEIGHTICAEL: 4812.8 [oz_av]

## 2017-03-08 LAB — BASIC METABOLIC PANEL
Anion gap: 10 (ref 5–15)
BUN: 13 mg/dL (ref 6–20)
CHLORIDE: 108 mmol/L (ref 101–111)
CO2: 19 mmol/L — AB (ref 22–32)
Calcium: 8.8 mg/dL — ABNORMAL LOW (ref 8.9–10.3)
Creatinine, Ser: 1.3 mg/dL — ABNORMAL HIGH (ref 0.61–1.24)
GFR calc non Af Amer: 52 mL/min — ABNORMAL LOW (ref >60)
Glucose, Bld: 114 mg/dL — ABNORMAL HIGH (ref 65–99)
Potassium: 3.8 mmol/L (ref 3.5–5.1)
SODIUM: 137 mmol/L (ref 135–145)

## 2017-03-08 LAB — HEMOGLOBIN A1C
Hgb A1c MFr Bld: 6.1 % — ABNORMAL HIGH (ref 4.8–5.6)
MEAN PLASMA GLUCOSE: 128.37 mg/dL

## 2017-03-08 LAB — BRAIN NATRIURETIC PEPTIDE: B NATRIURETIC PEPTIDE 5: 46.4 pg/mL (ref 0.0–100.0)

## 2017-03-08 NOTE — Progress Notes (Signed)
Progress Note  Patient Name: Gregory Wilson Date of Encounter: 03/08/2017  Primary Cardiologist: Kirk Ruths, MD  Subjective   Had sudden onset of chest discomfort while mowing his grasp. No prior similar discomfort. Upon transfer by EMS, the discomfort began resolving and upon arriving was having very little chest discomfort. Was treated as an assist elevation MI. Angiogram demonstrated partially thrombosed mid LAD drug-eluting stent and diffuse disease beyond this. Treated with angioplasty and restoration of tendon grade 3 flow.  Inpatient Medications    Scheduled Meds: . amLODipine  10 mg Oral q morning - 10a  . aspirin EC  81 mg Oral Daily  . atorvastatin  80 mg Oral q1800  . benzonatate  100 mg Oral Q8H  . docusate sodium  100 mg Oral BID  . heparin  5,000 Units Subcutaneous Q8H  . hydrochlorothiazide  12.5 mg Oral q morning - 10a  . irbesartan  300 mg Oral Daily  . metoprolol succinate  100 mg Oral Daily  . pantoprazole  40 mg Oral Daily  . sodium chloride flush  3 mL Intravenous Q12H  . ticagrelor  90 mg Oral BID   Continuous Infusions: . sodium chloride     PRN Meds: sodium chloride, acetaminophen, guaiFENesin-dextromethorphan, nitroGLYCERIN, ondansetron (ZOFRAN) IV, sodium chloride flush   Vital Signs    Vitals:   03/08/17 1000 03/08/17 1044 03/08/17 1046 03/08/17 1100  BP: 117/79   112/85  Pulse: 60 (!) 59 61 (!) 56  Resp: 13 16 12 15   Temp:      TempSrc:      SpO2: 99% 98% 98% 99%  Weight:      Height:        Intake/Output Summary (Last 24 hours) at 03/08/17 1208 Last data filed at 03/08/17 1100  Gross per 24 hour  Intake             1175 ml  Output              950 ml  Net              225 ml   Filed Weights   03/07/17 1800 03/08/17 0500  Weight: (!) 311 lb (141.1 kg) (!) 300 lb 12.8 oz (136.4 kg)    Telemetry     Sinus rhythm without ventricular ectopy- Personally Reviewed  ECG    Sinus rhythm, right bundle branch block, and no  acute ST-T wave change. When compared to the admitting EKG performed on 03/07/17, precordial T-wave inversion is less apparent. - Personally Reviewed  Physical Exam  Obese but in no distress. GEN: No acute distress.   Neck: No JVD Cardiac: RRR, no murmurs, rubs, or gallops.  Respiratory: Clear to auscultation bilaterally. GI: Soft, nontender, non-distended  MS: No edema; No deformity. Neuro:  Nonfocal  Psych: Normal affect   Labs    Chemistry Recent Labs Lab 03/07/17 1803 03/07/17 1810 03/08/17 0911  NA 142 141 137  K 3.3* 3.4* 3.8  CL 106 109 108  CO2  --  22 19*  GLUCOSE 100* 96 114*  BUN 15 15 13   CREATININE 1.50* 1.59* 1.30*  CALCIUM  --  9.0 8.8*  PROT  --  6.6  --   ALBUMIN  --  3.5  --   AST  --  25  --   ALT  --  15*  --   ALKPHOS  --  107  --   BILITOT  --  0.6  --  GFRNONAA  --  41* 52*  GFRAA  --  47* >60  ANIONGAP  --  10 10     Hematology Recent Labs Lab 03/07/17 1803 03/07/17 1810 03/08/17 0911  WBC  --  8.3 8.1  RBC  --  4.84 4.76  HGB 13.6 12.9* 12.6*  HCT 40.0 39.2 38.6*  MCV  --  81.0 81.1  MCH  --  26.7 26.5  MCHC  --  32.9 32.6  RDW  --  17.1* 17.5*  PLT  --  184 185    Cardiac Enzymes Recent Labs Lab 03/07/17 1810 03/08/17 0029 03/08/17 0911  TROPONINI 0.17* 3.61* 3.36*   No results for input(s): TROPIPOC in the last 168 hours.   BNP Recent Labs Lab 03/08/17 0029  BNP 46.4     DDimer No results for input(s): DDIMER in the last 168 hours.   Radiology    No results found.  Cardiac Studies   Acute coronary intervention 03/07/2017: Coronary Diagrams   Diagnostic Diagram       Post-Intervention Diagram        Past history disease beyond the standard segment with an area up to 75-80% obstructed.  Patient Profile     76 y.o. male a 76 y.o. male with a history of CAD s/p DES to LAD and BMS to Dx in 2003, HL, HTN, AFlutter (transient on treadmill - declined coumadin) and AAA presented by code STEMI and found to  have a partially thrombosed mid-LAD stent treated with angioplasty only.  Assessment & Plan    1. Anterior STEMI now s/p PTCA of partially thrombosed mid LAD Drug-eluting stent. Plan prolonged dual antiplatelet therapy as outlined in the cath report. 2. CAD s/p DES to LAD and BMS to Dx in 2003 3. RBBB 4. Hyperlipidemia 5. OSA  Plan transfer to floor. Ambulate and if stable eligible for discharge in a.m. Ischemic injury territory was relatively small and time of conclusion also limited. Needs aggressive Lipitor 10 LDL less than 70, hemoglobin A1c less than 7, blood pressure control less than 140/90, weight loss, and aerobic exercise. Consider phase 2 cardiac rehabilitation.   Signed, Sinclair Grooms, MD  03/08/2017, 12:08 PM

## 2017-03-08 NOTE — Care Management Note (Addendum)
Case Management Note  Patient Details  Name: [name] MRN: [id number] Date of Birth: September 21, 1940  Subjective/Objective:    From home with wife, s/p left heart cath and angiography, will be on brilinta,  Per benefit check: NCM gave patient the 30 day savings card, he will go to the [facility] in [place], they do have brilinta in stock.      Brilinta 90mg  is covered the copay is 47.00 for a 30day supply no prior is required the deductable is 200. And they have 139.26 left to pay              Action/Plan: NCM will follow for dc needs.  Expected Discharge Date:                  Expected Discharge Plan:  Home/Self Care  In-House Referral:     Discharge planning Services  CM Consult  Post Acute Care Choice:    Choice offered to:     DME Arranged:    DME Agency:     HH Arranged:    [place] Agency:     Status of Service:  Completed, signed off  If discussed at [employer] of Stay Meetings, dates discussed:    Additional Comments:  Zenon Mayo, RN 03/08/2017, 1:24 PM

## 2017-03-08 NOTE — Progress Notes (Signed)
Brilinta 90mg  is covered the copay is 47.00 for a 30day supply no prior is required the deductable is 200. And they have 139.26 left to pay

## 2017-03-08 NOTE — Progress Notes (Signed)
Patient has transfer order to [facility]- Report given to primary nurse. See assessment

## 2017-03-08 NOTE — Progress Notes (Signed)
  Echocardiogram 2D Echocardiogram has been performed.  Gregory Wilson 03/08/2017, 5:04 PM

## 2017-03-08 NOTE — Progress Notes (Signed)
TR band removed at 01:35. Level 0. Gauze and Tegaderm applied.

## 2017-03-08 NOTE — Progress Notes (Signed)
Critical troponin 3.61 received 02:15. Expected value. Pt denies any CP/SOB/chest tightness. Will continue to monitor closely.

## 2017-03-08 NOTE — Progress Notes (Addendum)
CARDIAC REHAB PHASE I   PRE:  Rate/Rhythm: 66 SR  BP:  Sitting: 112/85        SaO2: 100 RA  MODE:  Ambulation: 370 ft   POST:  Rate/Rhythm: 76 SR  BP:  Sitting: 141/99         SaO2: 97 RA  Pt ambulated 370 ft on RA, handheld assist, slow, mostly steady gait, tolerated fairly well.  Pt c/o chronic back and shoulder pain, denies DOE, cp, dizziness, declined rest stop. Of note, pt states he takes extra strength aspirin for chronic pain. Completed MI/stent education with pt and family at bedside.  Reviewed risk factors, MI book, anti-platelet therapy, stent card (unable to locate in room or in chart), activity restrictions, ntg, exercise, heart healthy and diabetes diet handouts (A1C 6.1) and phase 2 cardiac rehab. Pt and family verbalized understanding. Pt agrees to phase 2 cardiac rehab referral, will send to [place] per pt request. Pt to recliner after walk, call bell within reach. Will follow. Pt to see case manager regarding brilinta prior to discharge.   [id number] Lenna Sciara, RN, BSN 03/08/2017 12:00 PM

## 2017-03-09 DIAGNOSIS — E7800 Pure hypercholesterolemia, unspecified: Secondary | ICD-10-CM

## 2017-03-09 LAB — CBC
HEMATOCRIT: 35.9 % — AB (ref 39.0–52.0)
HEMOGLOBIN: 11.7 g/dL — AB (ref 13.0–17.0)
MCH: 26.5 pg (ref 26.0–34.0)
MCHC: 32.6 g/dL (ref 30.0–36.0)
MCV: 81.4 fL (ref 78.0–100.0)
Platelets: 171 10*3/uL (ref 150–400)
RBC: 4.41 MIL/uL (ref 4.22–5.81)
RDW: 17.5 % — ABNORMAL HIGH (ref 11.5–15.5)
WBC: 6.3 10*3/uL (ref 4.0–10.5)

## 2017-03-09 LAB — BASIC METABOLIC PANEL
ANION GAP: 7 (ref 5–15)
BUN: 14 mg/dL (ref 6–20)
CHLORIDE: 107 mmol/L (ref 101–111)
CO2: 23 mmol/L (ref 22–32)
Calcium: 8.6 mg/dL — ABNORMAL LOW (ref 8.9–10.3)
Creatinine, Ser: 1.44 mg/dL — ABNORMAL HIGH (ref 0.61–1.24)
GFR calc Af Amer: 53 mL/min — ABNORMAL LOW (ref >60)
GFR calc non Af Amer: 46 mL/min — ABNORMAL LOW (ref >60)
GLUCOSE: 98 mg/dL (ref 65–99)
Potassium: 3.4 mmol/L — ABNORMAL LOW (ref 3.5–5.1)
Sodium: 137 mmol/L (ref 135–145)

## 2017-03-09 MED ORDER — TICAGRELOR 90 MG PO TABS: 90.0000 mg | ORAL_TABLET | Freq: Two times a day (BID) | ORAL | 0 refills | Status: DC

## 2017-03-09 MED ORDER — TICAGRELOR 90 MG PO TABS: 90.0000 mg | ORAL_TABLET | Freq: Two times a day (BID) | ORAL | 10 refills | Status: DC

## 2017-03-09 MED ORDER — ASPIRIN 81 MG PO TBEC: 81.0000 mg | DELAYED_RELEASE_TABLET | Freq: Every day | ORAL | Status: DC

## 2017-03-09 MED ORDER — POTASSIUM CHLORIDE CRYS ER 20 MEQ PO TBCR
20.0000 meq | EXTENDED_RELEASE_TABLET | Freq: Every day | ORAL | Status: DC
  Filled 2017-03-09: qty 1

## 2017-03-09 MED ORDER — POTASSIUM CHLORIDE CRYS ER 20 MEQ PO TBCR
40.0000 meq | EXTENDED_RELEASE_TABLET | Freq: Once | ORAL | Status: AC
  Administered 2017-03-09: 40 meq via ORAL
  Filled 2017-03-09: qty 2

## 2017-03-09 MED ORDER — POTASSIUM CHLORIDE CRYS ER 20 MEQ PO TBCR: 20.0000 meq | EXTENDED_RELEASE_TABLET | Freq: Every day | ORAL | 5 refills | Status: DC

## 2017-03-09 MED ORDER — NITROGLYCERIN 0.4 MG SL SUBL
0.4000 mg | SUBLINGUAL_TABLET | SUBLINGUAL | 2 refills | Status: DC | PRN
Start: 2017-03-09 — End: 2020-04-13

## 2017-03-09 NOTE — Discharge Summary (Signed)
Discharge Summary    Patient ID: Gregory Wilson,  MRN: [id number], DOB/AGE: 1940-11-02 76 y.o.  Admit date: 03/07/2017 Discharge date: 03/09/2017  Primary Care Provider: [name] Primary Cardiologist: Dr. Stanford Breed   Discharge Diagnoses    Active Problems:   Hyperlipemia   Essential hypertension   Coronary atherosclerosis   Atrial flutter (HCC)   OSA (obstructive sleep apnea)   STEMI involving left anterior descending coronary artery (HCC)   Morbid obesity (HCC)   Allergies Allergies  Allergen Reactions  . Codeine Nausea And Vomiting    Diagnostic Studies/Procedures    Procedures   Coronary/Graft Acute MI Revascularization  LEFT HEART CATH AND CORONARY ANGIOGRAPHY  Conclusion   1. Probable anterior STEMI with partially occlusive thrombus within the remotely implanted mid LAD stent, treated successfully with heparin, Aggrastat, and balloon angioplasty 2. Widely patent left main, left circumflex, and right coronary artery with minor irregularities 3. Moderate diffuse disease throughout the mid and distal LAD beyond the stented segment 4. Continued patency of the previously stented first diagonal branch      History of Present Illness     Gregory Wilson is a 76 y.o. male with a history of CAD s/p DES to LAD and BMS to Dx in 2003, HL, HTN, AFlutter (transient on treadmill - declined coumadin) and AAA who presented by code STEMI by EMS on 03/07/17.   Recent abdominal ultra sound 01/16/17 showed AAA measuring 3.3x3.2cm.   Gregory Wilson was riding on his lawn mowere for 2 hours before he suddenly fell down. Could not stand up. He then had substernal chest pressure around 4:45 pm with SOB and diaphoresis. EMS activated by wife. By the time EMS arrived, his chest pain was subsided. No SL nitro given. He was given ASA 324mg  En route. Compliant with medications.   Hospital Course     On arrival to [facility], pt was taken emergently to the cath lab. Emergent cath was  performed by Dr. Burt Knack. Cath showed partially occlusive thrombus within the remotely implanted mid LAD stent, treated successfully with heparin, Aggrastat, and balloon angioplasty only. No stent placement. Widely patent left main, left circumflex, and right coronary artery with minor irregularities. Moderate diffuse disease throughout the mid and distal LAD beyond the stented segment. Continued patency of the previously stented first diagonal branch. He left the cath lab in stable condition. He was placed on DAPT with ASA and Brilinta.  He was continued on BB and statin, (Metoprolol and Lipitor 80 mg). His BP meds including Avapro and HCTZ were continued. He required addition of low dose potassium supplementation given mild hypokalemia from thiazide diuretic. 20 mEq daily was added to his regimen. He had no recurrent CP or dyspnea. His cath access site remained stable. 2D echo showed normal LVEF at 60-65%. No WMA. Vital signs and renal function remained stable.   On 03/09/17, he was seen and examined by Dr. Marlou Porch, who determined he was stable for discharge home. He will be scheduled for a TOC 7 day clinic f/u with an APP.    Consultants: none    Discharge Vitals Blood pressure 140/85, pulse 70, temperature 98.2 F (36.8 C), temperature source Oral, resp. rate 14, height 6\' 1"  (1.854 m), weight (!) 301 lb 14.4 oz (136.9 kg), SpO2 99 %.  Filed Weights   03/07/17 1800 03/08/17 0500 03/08/17 2118  Weight: (!) 311 lb (141.1 kg) (!) 300 lb 12.8 oz (136.4 kg) (!) 301 lb 14.4 oz (136.9 kg)  Labs & Radiologic Studies    CBC  Recent Labs  03/08/17 0911 03/09/17 0215  WBC 8.1 6.3  HGB 12.6* 11.7*  HCT 38.6* 35.9*  MCV 81.1 81.4  PLT 185 409   Basic Metabolic Panel  Recent Labs  03/08/17 0911 03/09/17 0215  NA 137 137  K 3.8 3.4*  CL 108 107  CO2 19* 23  GLUCOSE 114* 98  BUN 13 14  CREATININE 1.30* 1.44*  CALCIUM 8.8* 8.6*   Liver Function Tests  Recent Labs  03/07/17 1810    AST 25  ALT 15*  ALKPHOS 107  BILITOT 0.6  PROT 6.6  ALBUMIN 3.5   No results for input(s): LIPASE, AMYLASE in the last 72 hours. Cardiac Enzymes  Recent Labs  03/07/17 1810 03/08/17 0029 03/08/17 0911  TROPONINI 0.17* 3.61* 3.36*   BNP Invalid input(s): POCBNP D-Dimer No results for input(s): DDIMER in the last 72 hours. Hemoglobin A1C  Recent Labs  03/08/17 0029  HGBA1C 6.1*   Fasting Lipid Panel  Recent Labs  03/07/17 1810  CHOL 120  HDL 36*  LDLCALC 71  TRIG 67  CHOLHDL 3.3   Thyroid Function Tests No results for input(s): TSH, T4TOTAL, T3FREE, THYROIDAB in the last 72 hours.  Invalid input(s): FREET3 _____________  No results found. Disposition   Pt is being discharged home today in good condition.  Follow-up Plans & Appointments    Follow-up Information    Lelon Perla, MD Follow up.   Specialty:  Cardiology Why:  our office will call you with a hospital f/u visit  Contact information: [address] [place] [address] [phone]          Discharge Instructions    Amb Referral to Cardiac Rehabilitation    Complete by:  As directed    Diagnosis:   STEMI Coronary Stents     Diet - low sodium heart healthy    Complete by:  As directed    Increase activity slowly    Complete by:  As directed       Discharge Medications   Current Discharge Medication List    START taking these medications   Details  aspirin EC 81 MG EC tablet Take 1 tablet (81 mg total) by mouth daily.    nitroGLYCERIN (NITROSTAT) 0.4 MG SL tablet Place 1 tablet (0.4 mg total) under the tongue every 5 (five) minutes x 3 doses as needed for chest pain. Qty: 25 tablet, Refills: 2    potassium chloride SA (K-DUR,KLOR-CON) 20 MEQ tablet Take 1 tablet (20 mEq total) by mouth daily. Qty: 30 tablet, Refills: 5    !! ticagrelor (BRILINTA) 90 MG TABS tablet Take 1 tablet (90 mg total) by mouth 2 (two) times daily. Qty: 60 tablet, Refills: 10     !! ticagrelor (BRILINTA) 90 MG TABS tablet Take 1 tablet (90 mg total) by mouth 2 (two) times daily. Qty: 60 tablet, Refills: 0     !! - Potential duplicate medications found. Please discuss with provider.    CONTINUE these medications which have NOT CHANGED   Details  amLODipine (NORVASC) 10 MG tablet Take 1 tablet (10 mg total) by mouth every morning. Qty: 90 tablet, Refills: 3    atorvastatin (LIPITOR) 80 MG tablet Take 1 tablet (80 mg total) by mouth daily at 6 PM. Qty: 90 tablet, Refills: 3    benzonatate (TESSALON) 100 MG capsule Take 1 capsule (100 mg total) by mouth every 8 (eight) hours. Qty: 21  capsule, Refills: 0    esomeprazole (NEXIUM) 40 MG capsule Take 40 mg by mouth every morning.    hydrochlorothiazide (MICROZIDE) 12.5 MG capsule Take 1 capsule (12.5 mg total) by mouth every morning. Qty: 30 capsule, Refills: 11    irbesartan (AVAPRO) 300 MG tablet Take 1 tablet (300 mg total) by mouth daily. Qty: 90 tablet, Refills: 3    metoprolol succinate (TOPROL-XL) 100 MG 24 hr tablet Take 1 tablet (100 mg total) by mouth daily. Qty: 90 tablet, Refills: 3    docusate sodium 100 MG CAPS Take 100 mg by mouth 2 (two) times daily. Qty: 30 capsule, Refills: 0      STOP taking these medications     aspirin (BAYER ASPIRIN EXTRA STRENGTH) 500 MG tablet      meloxicam (MOBIC) 7.5 MG tablet          Aspirin prescribed at discharge?  Yes High Intensity Statin Prescribed? (Lipitor 40-80mg  or Crestor 20-40mg ): Yes Beta Blocker Prescribed? Yes For EF <40%, was ACEI/ARB Prescribed? Yes ADP Receptor Inhibitor Prescribed? (i.e. Plavix etc.-Includes Medically Managed Patients): Yes For EF <40%, Aldosterone Inhibitor Prescribed? No: EF >40% Was EF assessed during THIS hospitalization? Yes Was Cardiac Rehab II ordered? (Included Medically managed Patients): Yes   Outstanding Labs/Studies   None   Duration of Discharge Encounter   Greater than 30 minutes including  physician time.  Signed, Lyda Jester PA-C 03/09/2017, 11:34 AM  Personally seen and examined. Agree with above.  76 y.o. male with anterior ST elevation myocardial infarction which revealed a partially thrombosed drug-eluting LAD stent which was successfully balloon angioplastied. He has comorbidities of morbid obesity, hypertension, hyperlipidemia, previously described transient atrial flutter noted on treadmill-declined Coumadin, AAA  Assessment & Plan    Anterior ST elevation myocardial infarction  - Coronary artery disease with LAD partial stent thrombosis status post successful PTCA  - Continue dual antiplatelet therapy  - Cardiac rehabilitation  Coronary artery disease  - Prior drug eluding stent to LAD and bare metal stent to diagonal branch and 2003  Right bundle branch block  -Chronic  Hyperlipidemia  - Statin, high intensity  Morbid obesity  - Continue to encourage weight loss  Essential hypertension  - Medications reviewed, on low-dose hydrochlorothiazide. This may be contributing to mild hypokalemia  - I will send him out with 20 mEq of potassium daily. He will receive 40 mEq prior to discharge.  Shoulder pain  - Chronic, encourage Tylenol for pain relief. Discourage NSAIDs especially in the setting of dual antiplatelet therapy for risk of increased bleeding.  Okay with discharge. Transition of care visit in 7 days, APP

## 2017-03-09 NOTE — Progress Notes (Signed)
Progress Note  Patient Name: Gregory Wilson Date of Encounter: 03/09/2017  Primary Cardiologist: Stanford Breed  Subjective   Feels good this morning. No complaints, no chest pain, no shortness of breath.  His presentation was sudden onset chest discomfort while mowing his grass. LAD drug-eluting stent showed partial thrombosis and he was treated. Treated with angioplasty only.  Inpatient Medications    Scheduled Meds: . amLODipine  10 mg Oral q morning - 10a  . aspirin EC  81 mg Oral Daily  . atorvastatin  80 mg Oral q1800  . benzonatate  100 mg Oral Q8H  . docusate sodium  100 mg Oral BID  . heparin  5,000 Units Subcutaneous Q8H  . hydrochlorothiazide  12.5 mg Oral q morning - 10a  . irbesartan  300 mg Oral Daily  . metoprolol succinate  100 mg Oral Daily  . pantoprazole  40 mg Oral Daily  . ticagrelor  90 mg Oral BID   Continuous Infusions: . sodium chloride     PRN Meds: sodium chloride, acetaminophen, guaiFENesin-dextromethorphan, nitroGLYCERIN, ondansetron (ZOFRAN) IV   Vital Signs    Vitals:   03/08/17 1900 03/08/17 1947 03/08/17 2118 03/09/17 0443  BP: 120/80  140/85   Pulse: 61     Resp: (!) 8     Temp:  98.9 F (37.2 C) 98.3 F (36.8 C) 98.2 F (36.8 C)  TempSrc:  Oral Oral Oral  SpO2: 98%     Weight:   (!) 301 lb 14.4 oz (136.9 kg)   Height:   6\' 1"  (1.854 m)     Intake/Output Summary (Last 24 hours) at 03/09/17 1029 Last data filed at 03/09/17 5366  Gross per 24 hour  Intake             1130 ml  Output             1107 ml  Net               23 ml   Filed Weights   03/07/17 1800 03/08/17 0500 03/08/17 2118  Weight: (!) 311 lb (141.1 kg) (!) 300 lb 12.8 oz (136.4 kg) (!) 301 lb 14.4 oz (136.9 kg)    Telemetry    Sinus rhythm no adverse arrhythmias - Personally Reviewed  ECG    Right bundle branch block, sinus rhythm, no acute ST segment changes. - Personally Reviewed  Physical Exam   GEN: No acute distress.  Obese Neck: No  JVD Cardiac: RRR, no murmurs, rubs, or gallops.  Respiratory: Clear to auscultation bilaterally. GI: Soft, nontender, non-distended  MS: No edema; No deformity. Catheterization site normal Neuro:  Nonfocal  Psych: Normal affect   Labs    Chemistry Recent Labs Lab 03/07/17 1810 03/08/17 0911 03/09/17 0215  NA 141 137 137  K 3.4* 3.8 3.4*  CL 109 108 107  CO2 22 19* 23  GLUCOSE 96 114* 98  BUN 15 13 14   CREATININE 1.59* 1.30* 1.44*  CALCIUM 9.0 8.8* 8.6*  PROT 6.6  --   --   ALBUMIN 3.5  --   --   AST 25  --   --   ALT 15*  --   --   ALKPHOS 107  --   --   BILITOT 0.6  --   --   GFRNONAA 41* 52* 46*  GFRAA 47* >60 53*  ANIONGAP 10 10 7      Hematology Recent Labs Lab 03/07/17 1810 03/08/17 0911 03/09/17 0215  WBC 8.3 8.1  6.3  RBC 4.84 4.76 4.41  HGB 12.9* 12.6* 11.7*  HCT 39.2 38.6* 35.9*  MCV 81.0 81.1 81.4  MCH 26.7 26.5 26.5  MCHC 32.9 32.6 32.6  RDW 17.1* 17.5* 17.5*  PLT 184 185 171    Cardiac Enzymes Recent Labs Lab 03/07/17 1810 03/08/17 0029 03/08/17 0911  TROPONINI 0.17* 3.61* 3.36*   No results for input(s): TROPIPOC in the last 168 hours.   BNP Recent Labs Lab 03/08/17 0029  BNP 46.4     DDimer No results for input(s): DDIMER in the last 168 hours.   Radiology    No results found.  Cardiac Studies   intervention 03/07/2017: Coronary Diagrams   Diagnostic Diagram       Post-Intervention Diagram        Past history disease beyond the standard segment with an area up to 75-80% obstructed.   Patient Profile     76 y.o. male with anterior ST elevation myocardial infarction which revealed a partially thrombosed drug-eluting LAD stent which was successfully balloon angioplastied. He has comorbidities of morbid obesity, hypertension, hyperlipidemia, previously described transient atrial flutter noted on treadmill-declined Coumadin, AAA  Assessment & Plan    Anterior ST elevation myocardial infarction  - Coronary  artery disease with LAD partial stent thrombosis status post successful PTCA  - Continue dual antiplatelet therapy  - Cardiac rehabilitation  Coronary artery disease  - Prior drug eluding stent to LAD and bare metal stent to diagonal branch and 2003  Right bundle branch block  -Chronic  Hyperlipidemia  - Statin, high intensity  Morbid obesity  - Continue to encourage weight loss  Essential hypertension  - Medications reviewed, on low-dose hydrochlorothiazide. This may be contributing to mild hypokalemia  - I will send him out with 20 mEq of potassium daily. He will receive 40 mEq prior to discharge.  Shoulder pain  - Chronic, encourage Tylenol for pain relief. Discourage NSAIDs especially in the setting of dual antiplatelet therapy for risk of increased bleeding.  Okay with discharge. Transition of care visit in 7 days, APP  For questions or updates, please contact [place] Please consult [email or web address] for contact info under Cardiology/STEMI. Daytime calls, contact the Day Call APP (6a-8a) or assigned team (Teams A-D) provider (7:30a - 5p). All other daytime calls (7:30-5p), contact the Card Master @ [phone].   Nighttime calls, contact the assigned APP (5p-8p) or MD (6:30p-8p). Overnight calls (8p-6a), contact the on call Fellow @ [phone].      Signed, Candee Furbish, MD  03/09/2017, 10:29 AM

## 2017-03-09 NOTE — Progress Notes (Signed)
CARDIAC REHAB PHASE I   PRE:  Rate/Rhythm: 64 sinus rhytym  BP:  Supine:   Sitting: 147/89  Standing:    SaO2: 97% ra  MODE:  Ambulation: 470 ft   POST:  Rate/Rhythem: 97 sinus rhythm  BP:  Supine:   Sitting: 147/97  Standing:    SaO2: 98% ra  Pt ambulated in hallway x1 assist, steady gait.  Pt c/o orthopaedic back and shoulder discomfort, chronic for him.  Pt returned to bed, call light in reach.  Education reinforced, questions answered.  Pt encouraged to participate in CRPII.  Referral will be sent to [facility].   [place]

## 2017-03-11 ENCOUNTER — Telehealth: Payer: Self-pay | Admitting: Cardiology

## 2017-03-11 NOTE — Telephone Encounter (Signed)
Gregory Wilson is returning your call.. Thanks

## 2017-03-11 NOTE — Telephone Encounter (Signed)
Please call,pt says he needs some pain medicine please.

## 2017-03-11 NOTE — Telephone Encounter (Signed)
Unable to reach pt or leave a message mailbox is full 

## 2017-03-11 NOTE — Telephone Encounter (Signed)
Spoke with pt, his mobic was stopped because he is now on brilinta. He reports not being able to sleep due to shoulder and back pain. Explained to pt he can only use tylenol for his pain. He was advised to contact the orthopaedic doctor that gave him the mobic to see if they have some thing else he can use. He asked for a referral to the pain clinic and he was referred to his medical doctor.

## 2017-03-18 ENCOUNTER — Encounter: Payer: Self-pay | Admitting: Physician Assistant

## 2017-03-18 ENCOUNTER — Ambulatory Visit (INDEPENDENT_AMBULATORY_CARE_PROVIDER_SITE_OTHER): Payer: Medicare PPO | Admitting: Physician Assistant

## 2017-03-18 VITALS — BP 115/68 | HR 66 | Ht 73.5 in | Wt 298.0 lb

## 2017-03-18 DIAGNOSIS — I4892 Unspecified atrial flutter: Secondary | ICD-10-CM

## 2017-03-18 DIAGNOSIS — Z79899 Other long term (current) drug therapy: Secondary | ICD-10-CM | POA: Diagnosis not present

## 2017-03-18 DIAGNOSIS — E785 Hyperlipidemia, unspecified: Secondary | ICD-10-CM | POA: Diagnosis not present

## 2017-03-18 DIAGNOSIS — R7303 Prediabetes: Secondary | ICD-10-CM | POA: Diagnosis not present

## 2017-03-18 DIAGNOSIS — I2510 Atherosclerotic heart disease of native coronary artery without angina pectoris: Secondary | ICD-10-CM

## 2017-03-18 DIAGNOSIS — I4510 Unspecified right bundle-branch block: Secondary | ICD-10-CM

## 2017-03-18 DIAGNOSIS — I10 Essential (primary) hypertension: Secondary | ICD-10-CM | POA: Diagnosis not present

## 2017-03-18 LAB — BASIC METABOLIC PANEL
BUN/Creatinine Ratio: 15 (ref 10–24)
BUN: 23 mg/dL (ref 8–27)
CALCIUM: 9.4 mg/dL (ref 8.6–10.2)
CHLORIDE: 101 mmol/L (ref 96–106)
CO2: 20 mmol/L (ref 20–29)
Creatinine, Ser: 1.56 mg/dL — ABNORMAL HIGH (ref 0.76–1.27)
GFR calc non Af Amer: 43 mL/min/{1.73_m2} — ABNORMAL LOW (ref >59)
GFR, EST AFRICAN AMERICAN: 49 mL/min/{1.73_m2} — AB (ref >59)
Glucose: 102 mg/dL — ABNORMAL HIGH (ref 65–99)
POTASSIUM: 4.5 mmol/L (ref 3.5–5.2)
Sodium: 137 mmol/L (ref 134–144)

## 2017-03-18 NOTE — Progress Notes (Signed)
Cardiology Office Note    Date:  03/19/2017   ID:  Gregory Wilson, DOB 12-Feb-1941, MRN [id number]  PCP:  [name], MD  Cardiologist:  Dr. Stanford Breed  Chief Complaint  Patient presents with  . Hospital follow-up visit    pt had CATH on 9/6    History of Present Illness:  Gregory Wilson is a 76 y.o. male with PMH of HTN, HLD, prediabetes, OSA, RBBB and CAD. He had a history of DES to LAD and the BMS to Dx in 2003. He also had transient atrial flutter episodes in the past and declined Coumadin. His last Myoview was in June 2015 which showed EF 65%, no ischemia. He was last seen by Dr. Stanford Breed on 12/19/2016, he was doing well at the time. Last aortic ultrasound obtained on 12/06/2015 showed an ectatic AAA measuring 2.9 x 3.6 cm in proximal aorta. Follow-up ultrasound obtained on 01/18/27 showed stable suprarenal fusiform AAA in proximal aorta measuring 3.3 x 3.2 cm, increasing size of the right common iliac artery aneurysm. One year follow-up image was recommended.  He most recently presented to the hospital on 03/07/2017 with chest pain, shortness breath and diaphoresis. EKG showed right bundle branch block with ST-T wave abnormalities highly suggestive of an anterior STEMI. Emergent cardiac catheterization showed partially occlusive thrombus within the remotely implanted mid LAD stent treated successfully with heparin, Aggrastat and balloon angioplasty. Widely patent left main, left circumflex and RCA with minor irregularities, moderate diffuse disease throughout the mid and distal LAD beyond the stented segment. It was recommended for the patient to continue aspirin Brilinta for at least one year. Echocardiogram obtained on 03/08/2017 showed EF 60-65%, mild LVH.  He has been doing well from cardiac perspective since his discharge. However he's been bothered by chronic shoulder pain now he cannot take meloxicam due to the need of aspirin and Brilinta. He is currently on the 30 day free  Brilinta, however a month of Brilinta prescription cost him $186. We will give him the Brilinta copay card for him to check with his insurance. If there is no way to lower his cost, we will transition him to Plavix 75 mg daily after a single 300 mg loading dose. Otherwise he denies any chest discomfort or shortness of breath. He asked [place] to fill out his disability parking placard requested today. He is on aspirin, Brilinta, high-dose statin and metoprolol. Recent fasting lipid panel on 03/07/2017 showed cholesterol 120, triglycerides 67, HDL 36, LDL 71. Given the newly added potassium supplement for hypokalemia, I will check a basic metabolic panel.   Past Medical History:  Diagnosis Date  . Acute ST elevation myocardial infarction (STEMI) of anterior wall ([place]) 03/07/2017  . Arthritis   . BPH (benign prostatic hypertrophy)   . CAD (coronary artery disease)    a. Lexiscan Myoview (11/2013):  Normal stress nuclear study.  LV Ejection Fraction: 65%  . Complication of anesthesia    hx PONV  . Degenerative joint disease   . GERD (gastroesophageal reflux disease)   . H/O hiatal hernia   . History of atrial flutter    REMOTE HX ON TREADMILL  . History of colon polyps   . Hyperlipidemia   . Hypertension   . IBS (irritable bowel syndrome)   . Lower urinary tract symptoms (LUTS)   . OSA (obstructive sleep apnea) 03/26/2016  . RBBB (right bundle branch block)   . S/P drug eluting coronary stent placement    11-26-2001   X2 DES  TO LAD &  BMS  . Schatzki's ring   . Status post dilation of esophageal narrowing     Past Surgical History:  Procedure Laterality Date  . APPENDECTOMY    . CARDIOVASCULAR STRESS TEST  12-09-2013   NUCLEAR LEXISCAN STUDY--  RESULTS PENDING  . CARDIOVASCULAR STRESS TEST  04/2011  DR CRENSHAW   NORMAL /  NO ISCHEMIA/  EF 67%  . CHOLECYSTECTOMY    . COLONOSCOPY  05/29/2011   RMR: tubular adenoma  . CORONARY ANGIOPLASTY WITH STENT PLACEMENT  11-26-2001  DR Eustace Quail    DES (Cypher) to LAD 70%/  BMS (Express) to OSTIAL D1 80%/   CFX  &  RCA normal/  EF 65%  . CORONARY/GRAFT ACUTE MI REVASCULARIZATION N/A 03/07/2017   Procedure: Coronary/Graft Acute MI Revascularization;  Surgeon: Sherren Mocha, MD;  Location: [facility];  Service: Cardiovascular;  Laterality: N/A;  . ESOPHAGOGASTRODUODENOSCOPY  04/05/2004   RMR: Small hiatal hernia/Questionable subtle Schatzki's ring, status post dilation as described  . ESOPHAGOGASTRODUODENOSCOPY  05/2011   RMR: small hiatal hernia, empiric dilation with 45F and 27F Maloney  . ESOPHAGOGASTRODUODENOSCOPY (EGD) WITH ESOPHAGEAL DILATION N/A 01/16/2013   Dr. Rourk:baggy somewhat atonic esophagus-query underlying esophageal motility disorder. Status post passage of a Maloney dilator (large bore) empirically. Status post esophageal biopsy/Reflux symptoms are well controlled on Nexium.benign path  . LEFT HEART CATH AND CORONARY ANGIOGRAPHY N/A 03/07/2017   Procedure: LEFT HEART CATH AND CORONARY ANGIOGRAPHY;  Surgeon: Sherren Mocha, MD;  Location: [facility];  Service: Cardiovascular;  Laterality: N/A;  . TOTAL HIP ARTHROPLASTY Left 09-07-2003  . TOTAL KNEE ARTHROPLASTY Left 07-27-2002  . TRANSURETHRAL RESECTION OF PROSTATE N/A 12/21/2013   Procedure: TRANSURETHRAL RESECTION OF THE PROSTATE WITH GYRUS INSTRUMENTS;  Surgeon: Jorja Loa, MD;  Location: [facility];  Service: Urology;  Laterality: N/A;    Current Medications: Outpatient Medications Prior to Visit  Medication Sig Dispense Refill  . amLODipine (NORVASC) 10 MG tablet Take 1 tablet (10 mg total) by mouth every morning. 90 tablet 3  . aspirin EC 81 MG EC tablet Take 1 tablet (81 mg total) by mouth daily.    Marland Kitchen atorvastatin (LIPITOR) 80 MG tablet Take 1 tablet (80 mg total) by mouth daily at 6 PM. 90 tablet 3  . benzonatate (TESSALON) 100 MG capsule Take 1 capsule (100 mg total) by mouth every 8 (eight) hours. (Patient taking  differently: Take 100 mg by mouth every 8 (eight) hours as needed for cough. ) 21 capsule 0  . docusate sodium 100 MG CAPS Take 100 mg by mouth 2 (two) times daily. 30 capsule 0  . esomeprazole (NEXIUM) 40 MG capsule Take 40 mg by mouth every morning.    . hydrochlorothiazide (MICROZIDE) 12.5 MG capsule Take 1 capsule (12.5 mg total) by mouth every morning. 30 capsule 11  . irbesartan (AVAPRO) 300 MG tablet Take 1 tablet (300 mg total) by mouth daily. 90 tablet 3  . metoprolol succinate (TOPROL-XL) 100 MG 24 hr tablet Take 1 tablet (100 mg total) by mouth daily. 90 tablet 3  . nitroGLYCERIN (NITROSTAT) 0.4 MG SL tablet Place 1 tablet (0.4 mg total) under the tongue every 5 (five) minutes x 3 doses as needed for chest pain. 25 tablet 2  . potassium chloride SA (K-DUR,KLOR-CON) 20 MEQ tablet Take 1 tablet (20 mEq total) by mouth daily. 30 tablet 5  . ticagrelor (BRILINTA) 90 MG TABS tablet Take 1 tablet (90 mg total) by  mouth 2 (two) times daily. 60 tablet 0  . ticagrelor (BRILINTA) 90 MG TABS tablet Take 1 tablet (90 mg total) by mouth 2 (two) times daily. 60 tablet 0   No facility-administered medications prior to visit.      Allergies:   Codeine   Social History   Social History  . Marital status: Married    Spouse name: N/A  . Number of children: N/A  . Years of education: N/A   Occupational History  . retired    Social History Main Topics  . Smoking status: Never Smoker  . Smokeless tobacco: Never Used  . Alcohol use 0.0 oz/week     Comment: on holidays  . Drug use: No  . Sexual activity: No   Other Topics Concern  . None   Social History Narrative   Retired from [employer]     Family History:  The patient's family history includes Cancer in his father and sister; Coronary artery disease in his mother; Hypertension in his mother and sister; Kidney failure in his mother.   ROS:   Please see the history of present illness.    ROS All other systems reviewed  and are negative.   PHYSICAL EXAM:   VS:  BP 115/68 (BP Location: Left Arm, Patient Position: Sitting, Cuff Size: Large)   Pulse 66   Ht 6' 1.5" (1.867 m)   Wt 298 lb (135.2 kg)   BMI 38.78 kg/m    GEN: Well nourished, well developed, in no acute distress  HEENT: normal  Neck: no JVD, carotid bruits, or masses Cardiac: RRR; no murmurs, rubs, or gallops,no edema  Respiratory:  clear to auscultation bilaterally, normal work of breathing GI: soft, nontender, nondistended, + BS MS: no deformity or atrophy  Skin: warm and dry, no rash Neuro:  Alert and Oriented x 3, Strength and sensation are intact Psych: euthymic mood, full affect  Wt Readings from Last 3 Encounters:  03/18/17 298 lb (135.2 kg)  03/08/17 (!) 301 lb 14.4 oz (136.9 kg)  12/27/16 (!) 311 lb 3.2 oz (141.2 kg)      Studies/Labs Reviewed:   EKG:  EKG is ordered today.  The ekg ordered today demonstrates Normal sinus rhythm, RBBB  Recent Labs: 03/07/2017: ALT 15 03/08/2017: B Natriuretic Peptide 46.4 03/09/2017: Hemoglobin 11.7; Platelets 171 03/18/2017: BUN 23; Creatinine, Ser 1.56; Potassium 4.5; Sodium 137   Lipid Panel    Component Value Date/Time   CHOL 120 03/07/2017 1810   CHOL 115 12/27/2016 0951   TRIG 67 03/07/2017 1810   HDL 36 (L) 03/07/2017 1810   HDL 42 12/27/2016 0951   CHOLHDL 3.3 03/07/2017 1810   VLDL 13 03/07/2017 1810   LDLCALC 71 03/07/2017 1810   LDLCALC 60 12/27/2016 0951   LDLDIRECT 161.3 10/21/2008 1238    Additional studies/ records that were reviewed today include:   ABD [place] 12/06/2015 Technically challenging study. Ectatic AAA, maximum measurement of 2.9 cm x 3.6 cm in the proximal aorta. Normal caliber common and external iliac arteries, bilaterally. Aorto-iliac atherosclerosis, without focal stenosis. Incidental finding of a umbilical cyst. Patent IVC.   Home Sleep Study 03/19/2016     Abd [place] 01/17/2017 Technically challenging study. Stable suprarenal fusiform AAA in the  proximal aorta, measuring 3.3 cm x 3.2 cm. Increase in size in right common iliac artery aneurysm. Stable left common iliac artery aneurysm. Normal bilateral external iliac arteries. Aorto-iliac atherosclerosis. >50% right common iliac artery stenosis. Normal left common and bilateral external iliac  arteries. Increase in size in umbilical cyst, measuring 6.5 cm x 6.6 cm. Patent IVC.   Cath 03/07/2017 Conclusion   1. Probable anterior STEMI with partially occlusive thrombus within the remotely implanted mid LAD stent, treated successfully with heparin, Aggrastat, and balloon angioplasty 2. Widely patent left main, left circumflex, and right coronary artery with minor irregularities 3. Moderate diffuse disease throughout the mid and distal LAD beyond the stented segment 4. Continued patency of the previously stented first diagonal branch  Recommendations: We'll check an echocardiogram for assessment of LV function. Would continue aspirin and brilinta for 1 year and probably would consider long-term dual antiplatelet therapy with aspirin and clopidogrel after 1 year in this patient who had very late stent thrombosis     Echo 03/08/2017 LV EF: 60% -   65%  Study Conclusions  - Left ventricle: The cavity size was normal. Wall thickness was   increased in a pattern of mild LVH. Systolic function was normal.   The estimated ejection fraction was in the range of 60% to 65%.   Wall motion was normal; there were no regional wall motion   abnormalities.  ASSESSMENT:    1. Coronary artery disease involving native coronary artery of native heart without angina pectoris   2. Atrial flutter, unspecified type ([place])   3. Encounter for long-term (current) use of medications   4. Essential hypertension   5. Hyperlipidemia, unspecified hyperlipidemia type   6. Prediabetes   7. RBBB      PLAN:  In order of problems listed above:  1. CAD: Doing well after recent cardiac catheterization,  coronary on aspirin and Brilinta. Brilinta is very expensive for him. We had given him Brilinta co-pay card, if he cannot reduce the cost of Brilinta, I will switch him to Plavix instead. We emphasized the need to be compliant with dual antiplatelet therapy, he is aware that he will need to contact cardiology service if he ever run out of his medication or cannot afford his medication for any reason.  2. Hypertension: Blood pressure stable today on beta blocker, hydrochlorothiazide and irbesartan.   3. Hyperlipidemia: Continue Lipitor 80 mg daily, recent lipid panel 03/07/2017 showed cholesterol 120, triglycerides 67, HDL 36, LDL 71.  4. Prediabetes: Will defer to primary care provider.  5. Transient atrial flutter: No obvious recurrence, documented on previous treadmill. He has declined Coumadin therapy previously and understand the risk of stroke.  6. Hypokalemia: On potassium supplement, will check basic metabolic panel    Medication Adjustments/Labs and Tests Ordered: Current medicines are reviewed at length with the patient today.  Concerns regarding medicines are outlined above.  Medication changes, Labs and Tests ordered today are listed in the Patient Instructions below. Patient Instructions  Medication Instructions:   Continue current medication. We are giving you samples of Brilinta as well as a discount card. Please contact [employer] (the manufacturer of this medication) to see if you are able to get this medication at a reduced cost. Please continue to take this medication as instructed. If you are not able to afford it please call our office to let us know.  Labwork:   We will check a BMET today to assess your kidney function and electrolytes.  Testing/Procedures:  none  Follow-Up:  With Dr. Stanford Breed in 2-3 months   If you need a refill on your cardiac medications before your next appointment, please call your pharmacy.      Hilbert Corrigan, [place]  03/19/2017 10:36  PM  [facility] [place], [place], [place]  [address] Phone: [phone]; Fax: [phone]

## 2017-03-18 NOTE — Patient Instructions (Signed)
Medication Instructions:   Continue current medication. We are giving you samples of Brilinta as well as a discount card. Please contact [employer] (the manufacturer of this medication) to see if you are able to get this medication at a reduced cost. Please continue to take this medication as instructed. If you are not able to afford it please call our office to let us know.  Labwork:   We will check a BMET today to assess your kidney function and electrolytes.  Testing/Procedures:  none  Follow-Up:  With Dr. Stanford Breed in 2-3 months   If you need a refill on your cardiac medications before your next appointment, please call your pharmacy.

## 2017-03-19 ENCOUNTER — Encounter: Payer: Self-pay | Admitting: Physician Assistant

## 2017-04-05 ENCOUNTER — Other Ambulatory Visit: Payer: Self-pay | Admitting: Cardiology

## 2017-04-05 NOTE — Telephone Encounter (Signed)
REFILL 

## 2017-04-11 ENCOUNTER — Encounter: Payer: Self-pay | Admitting: Physician Assistant

## 2017-05-02 ENCOUNTER — Encounter: Payer: Self-pay | Admitting: Physician Assistant

## 2017-05-13 ENCOUNTER — Encounter: Payer: Self-pay | Admitting: Physician Assistant

## 2017-05-13 NOTE — Telephone Encounter (Signed)
Please see if we have 90 mg twice a day of brilinta samples. Also to check with patient if he was able to get medicine assistance form turned in for Brilinta. If he is unable to obtain Brilinta due to financial issues, we may have to switch to plavix.

## 2017-05-27 ENCOUNTER — Encounter: Payer: Self-pay | Admitting: Physician Assistant

## 2017-05-28 NOTE — Telephone Encounter (Signed)
Do we have any brilinta sample? Also check with patient how much does Brilinta cost him and if he has missed any doses. If it become cost inhibitory for him to resume brilinta, I may switch him to plavix.  Gregory Wilson

## 2017-05-29 NOTE — Telephone Encounter (Signed)
Call goes to VM. Left detailed msg advising samples available at front desk, if questions or concerns regarding ability to afford medication, he should call us back to discuss options as Isaac Laud recommended.

## 2017-06-12 ENCOUNTER — Other Ambulatory Visit: Payer: Self-pay | Admitting: *Deleted

## 2017-06-12 MED ORDER — ATORVASTATIN CALCIUM 80 MG PO TABS: 80.0000 mg | ORAL_TABLET | Freq: Every day | ORAL | 0 refills | Status: DC

## 2017-06-12 MED ORDER — AMLODIPINE BESYLATE 10 MG PO TABS: 10.0000 mg | ORAL_TABLET | Freq: Every morning | ORAL | 0 refills | Status: DC

## 2017-06-12 MED ORDER — METOPROLOL SUCCINATE ER 100 MG PO TB24: 100.0000 mg | ORAL_TABLET | Freq: Every day | ORAL | 0 refills | Status: DC

## 2017-06-12 MED ORDER — IRBESARTAN 300 MG PO TABS: 300.0000 mg | ORAL_TABLET | Freq: Every day | ORAL | 0 refills | Status: DC

## 2017-06-12 NOTE — Telephone Encounter (Signed)
REFILL 

## 2017-06-14 ENCOUNTER — Other Ambulatory Visit: Payer: Self-pay | Admitting: *Deleted

## 2017-06-14 MED ORDER — METOPROLOL SUCCINATE ER 100 MG PO TB24: 100.0000 mg | ORAL_TABLET | Freq: Every day | ORAL | 1 refills | Status: DC

## 2017-06-14 MED ORDER — IRBESARTAN 300 MG PO TABS: 300.0000 mg | ORAL_TABLET | Freq: Every day | ORAL | 1 refills | Status: DC

## 2017-06-14 MED ORDER — ATORVASTATIN CALCIUM 80 MG PO TABS: 80.0000 mg | ORAL_TABLET | Freq: Every day | ORAL | 1 refills | Status: DC

## 2017-06-15 ENCOUNTER — Encounter: Payer: Self-pay | Admitting: Cardiology

## 2017-06-16 ENCOUNTER — Encounter: Payer: Self-pay | Admitting: Cardiology

## 2017-06-17 ENCOUNTER — Other Ambulatory Visit: Payer: Self-pay | Admitting: *Deleted

## 2017-06-17 ENCOUNTER — Other Ambulatory Visit: Payer: Self-pay

## 2017-06-17 MED ORDER — HYDROCHLOROTHIAZIDE 12.5 MG PO CAPS: 12.5000 mg | ORAL_CAPSULE | Freq: Every morning | ORAL | 11 refills | Status: DC

## 2017-06-17 NOTE — Telephone Encounter (Signed)
-----   Message ----- From: Loren Racer, LPN To: [address] Refill Subject: FW: Non-Urgent Medical Question ----- Message ----- From: [name] Sent: 06/15/2017 10:35 AM To: [address] Triage Subject: Non-Urgent Medical Question  ----- Message from [place], Generic sent at 06/15/2017 10:35 AM EST ----- I'm completely out of (HCTZ) can i get refill [facility] in [place].  Sent: 06/17/2017  7:27 AM  This is Dr. Jacalyn Lefevre pt. Please advise Thanks

## 2017-06-21 ENCOUNTER — Other Ambulatory Visit: Payer: Self-pay | Admitting: Cardiology

## 2017-06-21 MED ORDER — HYDROCHLOROTHIAZIDE 12.5 MG PO CAPS: 12.5000 mg | ORAL_CAPSULE | Freq: Every morning | ORAL | 3 refills | Status: DC

## 2017-06-21 NOTE — Telephone Encounter (Signed)
Rx(s) sent to pharmacy electronically.  

## 2017-06-26 ENCOUNTER — Other Ambulatory Visit: Payer: Self-pay

## 2017-06-26 MED ORDER — HYDROCHLOROTHIAZIDE 12.5 MG PO CAPS: 12.5000 mg | ORAL_CAPSULE | Freq: Every morning | ORAL | 2 refills | Status: DC

## 2017-07-16 NOTE — Progress Notes (Signed)
HPI: FU CAD s/p DES to LAD and BMS to Dx in 2003, PTCA LAD 9/18, HL, HTN, AFlutter (transient on treadmill - declined coumadin). Since last seen, the patient has dyspnea with more extreme activities but not with routine activities. It is relieved with rest. It is not associated with chest pain. There is no orthopnea, PND or pedal edema. There is no syncope or palpitations. There is no exertional chest pain.     Studies: - LHC (10/2001): Mid LAD 70%, Ostial D1 80%, CFX and RCA normal, EF 65%. PCI: Cypher DES to LAD and Express BMS to Dx.  -  LHC September 2018 showed anterior infarct secondary to partially occlusive thrombus in prior LAD stent successfully treated with heparin, Aggrastat and balloon angioplasty. No other obstructive disease noted. Antiplatelet therapy recommended long-term.  - Echocardiogram September 2018 showed normal LV function. - Nuclear (6/15): No ischemia, EF 65%, Nomal  - Abdominal ultrasound (7/18): Abdominal aortic aneurysm measuring 3.2 x 3.3 cm.  Current Outpatient Medications  Medication Sig Dispense Refill  . amLODipine (NORVASC) 10 MG tablet Take 1 tablet (10 mg total) by mouth every morning. KEEP OV. 90 tablet 0  . aspirin EC 81 MG EC tablet Take 1 tablet (81 mg total) by mouth daily.    Marland Kitchen atorvastatin (LIPITOR) 80 MG tablet Take 1 tablet (80 mg total) by mouth daily at 6 PM. KEEP OV. 90 tablet 1  . benzonatate (TESSALON) 100 MG capsule Take 1 capsule (100 mg total) by mouth every 8 (eight) hours. (Patient taking differently: Take 100 mg by mouth every 8 (eight) hours as needed for cough. ) 21 capsule 0  . BRILINTA 90 MG TABS tablet TAKE 1 TABLET (90 MG TOTAL) BY MOUTH 2 (TWO) TIMES DAILY. 60 tablet 9  . docusate sodium 100 MG CAPS Take 100 mg by mouth 2 (two) times daily. 30 capsule 0  . esomeprazole (NEXIUM) 40 MG capsule Take 40 mg by mouth every morning.    . hydrochlorothiazide (MICROZIDE) 12.5 MG capsule Take 1 capsule (12.5 mg total) by  mouth every morning. 90 capsule 2  . irbesartan (AVAPRO) 300 MG tablet Take 1 tablet (300 mg total) by mouth daily. KEEP OV. 90 tablet 1  . metoprolol succinate (TOPROL-XL) 100 MG 24 hr tablet Take 1 tablet (100 mg total) by mouth daily. KEEP OV. 90 tablet 1  . nitroGLYCERIN (NITROSTAT) 0.4 MG SL tablet Place 1 tablet (0.4 mg total) under the tongue every 5 (five) minutes x 3 doses as needed for chest pain. 25 tablet 2  . potassium chloride SA (K-DUR,KLOR-CON) 20 MEQ tablet Take 1 tablet (20 mEq total) by mouth daily. 30 tablet 5   No current facility-administered medications for this visit.      Past Medical History:  Diagnosis Date  . Acute ST elevation myocardial infarction (STEMI) of anterior wall ([place]) 03/07/2017  . Arthritis   . BPH (benign prostatic hypertrophy)   . CAD (coronary artery disease)    a. Lexiscan Myoview (11/2013):  Normal stress nuclear study.  LV Ejection Fraction: 65%  . Complication of anesthesia    hx PONV  . Degenerative joint disease   . GERD (gastroesophageal reflux disease)   . H/O hiatal hernia   . History of atrial flutter    REMOTE HX ON TREADMILL  . History of colon polyps   . Hyperlipidemia   . Hypertension   . IBS (irritable bowel syndrome)   . Lower urinary tract symptoms (LUTS)   .  OSA (obstructive sleep apnea) 03/26/2016  . RBBB (right bundle branch block)   . S/P drug eluting coronary stent placement    11-26-2001   X2 DES  TO LAD &  BMS  . Schatzki's ring   . Status post dilation of esophageal narrowing     Past Surgical History:  Procedure Laterality Date  . APPENDECTOMY    . CARDIOVASCULAR STRESS TEST  12-09-2013   NUCLEAR LEXISCAN STUDY--  RESULTS PENDING  . CARDIOVASCULAR STRESS TEST  04/2011  DR CRENSHAW   NORMAL /  NO ISCHEMIA/  EF 67%  . CHOLECYSTECTOMY    . COLONOSCOPY  05/29/2011   RMR: tubular adenoma  . CORONARY ANGIOPLASTY WITH STENT PLACEMENT  11-26-2001  DR Eustace Quail   DES (Cypher) to LAD 70%/  BMS (Express) to  OSTIAL D1 80%/   CFX  &  RCA normal/  EF 65%  . CORONARY/GRAFT ACUTE MI REVASCULARIZATION N/A 03/07/2017   Procedure: Coronary/Graft Acute MI Revascularization;  Surgeon: Sherren Mocha, MD;  Location: [facility];  Service: Cardiovascular;  Laterality: N/A;  . ESOPHAGOGASTRODUODENOSCOPY  04/05/2004   RMR: Small hiatal hernia/Questionable subtle Schatzki's ring, status post dilation as described  . ESOPHAGOGASTRODUODENOSCOPY  05/2011   RMR: small hiatal hernia, empiric dilation with 51F and 64F Maloney  . ESOPHAGOGASTRODUODENOSCOPY (EGD) WITH ESOPHAGEAL DILATION N/A 01/16/2013   Dr. Rourk:baggy somewhat atonic esophagus-query underlying esophageal motility disorder. Status post passage of a Maloney dilator (large bore) empirically. Status post esophageal biopsy/Reflux symptoms are well controlled on Nexium.benign path  . LEFT HEART CATH AND CORONARY ANGIOGRAPHY N/A 03/07/2017   Procedure: LEFT HEART CATH AND CORONARY ANGIOGRAPHY;  Surgeon: Sherren Mocha, MD;  Location: [facility];  Service: Cardiovascular;  Laterality: N/A;  . TOTAL HIP ARTHROPLASTY Left 09-07-2003  . TOTAL KNEE ARTHROPLASTY Left 07-27-2002  . TRANSURETHRAL RESECTION OF PROSTATE N/A 12/21/2013   Procedure: TRANSURETHRAL RESECTION OF THE PROSTATE WITH GYRUS INSTRUMENTS;  Surgeon: Jorja Loa, MD;  Location: [facility];  Service: Urology;  Laterality: N/A;    Social History   Socioeconomic History  . Marital status: Married    Spouse name: Not on file  . Number of children: Not on file  . Years of education: Not on file  . Highest education level: Not on file  Social Needs  . Financial resource strain: Not on file  . Food insecurity - worry: Not on file  . Food insecurity - inability: Not on file  . Transportation needs - medical: Not on file  . Transportation needs - non-medical: Not on file  Occupational History  . Occupation: retired  Tobacco Use  . Smoking status: Never Smoker    . Smokeless tobacco: Never Used  Substance and Sexual Activity  . Alcohol use: Yes    Alcohol/week: 0.0 oz    Comment: on holidays  . Drug use: No  . Sexual activity: No  Other Topics Concern  . Not on file  Social History Narrative   Retired from [employer]    Family History  Problem Relation Age of Onset  . Cancer Father        Lung  . Hypertension Mother   . Coronary artery disease Mother   . Kidney failure Mother   . Cancer Sister        breast carcinoma  . Hypertension Sister   . Colon cancer Neg Hx     ROS: no fevers or chills, productive cough, hemoptysis, dysphasia, odynophagia, melena, hematochezia,  dysuria, hematuria, rash, seizure activity, orthopnea, PND, pedal edema, claudication. Remaining systems are negative.  Physical Exam: Well-developed obese in no acute distress.  Skin is warm and dry.  HEENT is normal.  Neck is supple.  Chest is clear to auscultation with normal expansion.  Cardiovascular exam is regular rate and rhythm.  Abdominal exam nontender or distended. No masses palpated. Extremities show trace edema. neuro grossly intact   A/P  1 coronary artery disease-continue aspirin and statin.  2 hypertension-blood pressure is controlled. Continue present medications.  3 hyperlipidemia-continue statin.  4 abdominal aortic aneurysm-arrange follow-up abdominal ultrasound July 2019.  5 transient atrial flutter-1 isolated episode documented on previous exercise treadmill. Continue beta blocker. We have discussed anticoagulation in the past and he declines understanding risk of CVA.  Kirk Ruths, MD

## 2017-07-19 ENCOUNTER — Encounter: Payer: Self-pay | Admitting: Cardiology

## 2017-07-19 ENCOUNTER — Ambulatory Visit (INDEPENDENT_AMBULATORY_CARE_PROVIDER_SITE_OTHER): Payer: Medicare PPO | Admitting: Cardiology

## 2017-07-19 VITALS — BP 128/84 | HR 80 | Ht 73.0 in | Wt 303.0 lb

## 2017-07-19 DIAGNOSIS — E7800 Pure hypercholesterolemia, unspecified: Secondary | ICD-10-CM

## 2017-07-19 DIAGNOSIS — I2510 Atherosclerotic heart disease of native coronary artery without angina pectoris: Secondary | ICD-10-CM

## 2017-07-19 DIAGNOSIS — I10 Essential (primary) hypertension: Secondary | ICD-10-CM | POA: Diagnosis not present

## 2017-07-19 DIAGNOSIS — I714 Abdominal aortic aneurysm, without rupture, unspecified: Secondary | ICD-10-CM

## 2017-07-19 NOTE — Patient Instructions (Addendum)
Medication Instructions:  Your physician recommends that you continue on your current medications as directed. Please refer to the Current Medication list given to you today.   Labwork: -None  Testing/Procedures: -None  Follow-Up: Your physician wants you to follow-up in: 6 months with Dr.Crenshaw.  You will receive a reminder letter in the mail two months in advance. If you don't receive a letter, please call our office to schedule the follow-up appointment.   Any Other Special Instructions Will Be Listed Below (If Applicable).     If you need a refill on your cardiac medications before your next appointment, please call your pharmacy.   

## 2017-08-18 ENCOUNTER — Encounter: Payer: Self-pay | Admitting: Cardiology

## 2017-08-19 ENCOUNTER — Encounter: Payer: Self-pay | Admitting: Cardiology

## 2017-08-19 ENCOUNTER — Telehealth: Payer: Self-pay | Admitting: Cardiology

## 2017-08-19 NOTE — Telephone Encounter (Signed)
Gregory Wilson is calling to find out if he needs to stop his Brilinta before having a tooth extraction . Please call

## 2017-08-19 NOTE — Telephone Encounter (Signed)
Unable to reach pt or leave a message mailbox is full 

## 2017-08-19 NOTE — Telephone Encounter (Signed)
Okay to hold brilinta sent to patient through my chart attached to the patients email request.

## 2017-09-06 ENCOUNTER — Encounter: Payer: Self-pay | Admitting: Cardiology

## 2017-09-24 ENCOUNTER — Encounter: Payer: Self-pay | Admitting: Cardiology

## 2017-10-07 ENCOUNTER — Other Ambulatory Visit: Payer: Self-pay | Admitting: *Deleted

## 2017-10-07 DIAGNOSIS — I714 Abdominal aortic aneurysm, without rupture, unspecified: Secondary | ICD-10-CM

## 2017-11-04 ENCOUNTER — Encounter: Payer: Self-pay | Admitting: Cardiology

## 2017-12-12 ENCOUNTER — Encounter: Payer: Self-pay | Admitting: Cardiology

## 2017-12-13 ENCOUNTER — Other Ambulatory Visit: Payer: Self-pay | Admitting: *Deleted

## 2017-12-13 MED ORDER — AMLODIPINE BESYLATE 10 MG PO TABS: 10.0000 mg | ORAL_TABLET | Freq: Every morning | ORAL | 3 refills | Status: DC

## 2017-12-30 ENCOUNTER — Encounter: Payer: Self-pay | Admitting: Cardiology

## 2017-12-31 NOTE — Progress Notes (Signed)
HPI: FU CAD s/p DES to LAD and BMS to Dx in 2003, PTCA LAD 9/18, HL, HTN, AFlutter (transient on treadmill - declined coumadin). Since last seen, patient denies dyspnea, chest pain, palpitations or syncope.  Studies: - LHC (10/2001): Mid LAD 70%, Ostial D1 80%, CFX and RCA normal, EF 65%. PCI: Cypher DES to LAD and Express BMS to Dx.  -  LHC September 2018 showed anterior infarct secondary to partially occlusive thrombus in prior LAD stent successfully treated with heparin, Aggrastat and balloon angioplasty. No other obstructive disease noted. Antiplatelet therapy recommended long-term.  - Echocardiogram September 2018 showed normal LV function. - Nuclear (6/15): No ischemia, EF 65%, Nomal -Abdominal ultrasound(7/18): Abdominal aortic aneurysm measuring 3.2 x 3.3 cm.   Current Outpatient Medications  Medication Sig Dispense Refill  . amLODipine (NORVASC) 10 MG tablet Take 1 tablet (10 mg total) by mouth every morning. KEEP OV. 90 tablet 3  . aspirin EC 81 MG EC tablet Take 1 tablet (81 mg total) by mouth daily.    Marland Kitchen atorvastatin (LIPITOR) 80 MG tablet Take 1 tablet (80 mg total) by mouth daily at 6 PM. KEEP OV. 90 tablet 1  . BRILINTA 90 MG TABS tablet TAKE 1 TABLET (90 MG TOTAL) BY MOUTH 2 (TWO) TIMES DAILY. 60 tablet 9  . docusate sodium 100 MG CAPS Take 100 mg by mouth 2 (two) times daily. 30 capsule 0  . esomeprazole (NEXIUM) 40 MG capsule Take 40 mg by mouth as needed.     . hydrochlorothiazide (MICROZIDE) 12.5 MG capsule Take 1 capsule (12.5 mg total) by mouth every morning. 90 capsule 2  . irbesartan (AVAPRO) 300 MG tablet Take 1 tablet (300 mg total) by mouth daily. KEEP OV. 90 tablet 1  . metoprolol succinate (TOPROL-XL) 100 MG 24 hr tablet Take 1 tablet (100 mg total) by mouth daily. KEEP OV. 90 tablet 1  . nitroGLYCERIN (NITROSTAT) 0.4 MG SL tablet Place 1 tablet (0.4 mg total) under the tongue every 5 (five) minutes x 3 doses as needed for chest pain. 25 tablet  2  . potassium chloride SA (K-DUR,KLOR-CON) 20 MEQ tablet Take 1 tablet (20 mEq total) by mouth daily. 30 tablet 5  . benzonatate (TESSALON) 100 MG capsule Take 1 capsule (100 mg total) by mouth every 8 (eight) hours. (Patient not taking: Reported on 01/08/2018) 21 capsule 0   No current facility-administered medications for this visit.      Past Medical History:  Diagnosis Date  . Acute ST elevation myocardial infarction (STEMI) of anterior wall ([place]) 03/07/2017  . Arthritis   . BPH (benign prostatic hypertrophy)   . CAD (coronary artery disease)    a. Lexiscan Myoview (11/2013):  Normal stress nuclear study.  LV Ejection Fraction: 65%  . Complication of anesthesia    hx PONV  . Degenerative joint disease   . GERD (gastroesophageal reflux disease)   . H/O hiatal hernia   . History of atrial flutter    REMOTE HX ON TREADMILL  . History of colon polyps   . Hyperlipidemia   . Hypertension   . IBS (irritable bowel syndrome)   . Lower urinary tract symptoms (LUTS)   . OSA (obstructive sleep apnea) 03/26/2016  . RBBB (right bundle branch block)   . S/P drug eluting coronary stent placement    11-26-2001   X2 DES  TO LAD &  BMS  . Schatzki's ring   . Status post dilation of esophageal narrowing  Past Surgical History:  Procedure Laterality Date  . APPENDECTOMY    . CARDIOVASCULAR STRESS TEST  12-09-2013   NUCLEAR LEXISCAN STUDY--  RESULTS PENDING  . CARDIOVASCULAR STRESS TEST  04/2011  DR CRENSHAW   NORMAL /  NO ISCHEMIA/  EF 67%  . CHOLECYSTECTOMY    . COLONOSCOPY  05/29/2011   RMR: tubular adenoma  . CORONARY ANGIOPLASTY WITH STENT PLACEMENT  11-26-2001  DR Eustace Quail   DES (Cypher) to LAD 70%/  BMS (Express) to OSTIAL D1 80%/   CFX  &  RCA normal/  EF 65%  . CORONARY/GRAFT ACUTE MI REVASCULARIZATION N/A 03/07/2017   Procedure: Coronary/Graft Acute MI Revascularization;  Surgeon: Sherren Mocha, MD;  Location: [facility];  Service: Cardiovascular;  Laterality: N/A;    . ESOPHAGOGASTRODUODENOSCOPY  04/05/2004   RMR: Small hiatal hernia/Questionable subtle Schatzki's ring, status post dilation as described  . ESOPHAGOGASTRODUODENOSCOPY  05/2011   RMR: small hiatal hernia, empiric dilation with 8F and 19F Maloney  . ESOPHAGOGASTRODUODENOSCOPY (EGD) WITH ESOPHAGEAL DILATION N/A 01/16/2013   Dr. Rourk:baggy somewhat atonic esophagus-query underlying esophageal motility disorder. Status post passage of a Maloney dilator (large bore) empirically. Status post esophageal biopsy/Reflux symptoms are well controlled on Nexium.benign path  . LEFT HEART CATH AND CORONARY ANGIOGRAPHY N/A 03/07/2017   Procedure: LEFT HEART CATH AND CORONARY ANGIOGRAPHY;  Surgeon: Sherren Mocha, MD;  Location: [facility];  Service: Cardiovascular;  Laterality: N/A;  . TOTAL HIP ARTHROPLASTY Left 09-07-2003  . TOTAL KNEE ARTHROPLASTY Left 07-27-2002  . TRANSURETHRAL RESECTION OF PROSTATE N/A 12/21/2013   Procedure: TRANSURETHRAL RESECTION OF THE PROSTATE WITH GYRUS INSTRUMENTS;  Surgeon: Jorja Loa, MD;  Location: [facility];  Service: Urology;  Laterality: N/A;    Social History   Socioeconomic History  . Marital status: Married    Spouse name: Not on file  . Number of children: Not on file  . Years of education: Not on file  . Highest education level: Not on file  Occupational History  . Occupation: retired  Scientific laboratory technician  . Financial resource strain: Not on file  . Food insecurity:    Worry: Not on file    Inability: Not on file  . Transportation needs:    Medical: Not on file    Non-medical: Not on file  Tobacco Use  . Smoking status: Never Smoker  . Smokeless tobacco: Never Used  Substance and Sexual Activity  . Alcohol use: Yes    Comment: on holidays  . Drug use: No  . Sexual activity: Never  Lifestyle  . Physical activity:    Days per week: Not on file    Minutes per session: Not on file  . Stress: Not on file  Relationships  .  Social connections:    Talks on phone: Not on file    Gets together: Not on file    Attends religious service: Not on file    Active member of club or organization: Not on file    Attends meetings of clubs or organizations: Not on file    Relationship status: Not on file  . Intimate partner violence:    Fear of current or ex partner: Not on file    Emotionally abused: Not on file    Physically abused: Not on file    Forced sexual activity: Not on file  Other Topics Concern  . Not on file  Social History Narrative   Retired from [employer]  Family History  Problem Relation Age of Onset  . Cancer Father        Lung  . Hypertension Mother   . Coronary artery disease Mother   . Kidney failure Mother   . Cancer Sister        breast carcinoma  . Hypertension Sister   . Colon cancer Neg Hx     ROS: Back pain but no fevers or chills, productive cough, hemoptysis, dysphasia, odynophagia, melena, hematochezia, dysuria, hematuria, rash, seizure activity, orthopnea, PND, pedal edema, claudication. Remaining systems are negative.  Physical Exam: Well-developed obese in no acute distress.  Skin is warm and dry.  HEENT is normal.  Neck is supple.  Chest is clear to auscultation with normal expansion.  Cardiovascular exam is regular rate and rhythm.  Abdominal exam nontender or distended. No masses palpated. Extremities show no edema. neuro grossly intact  ECG-sinus rhythm at a rate of 58.  Right bundle branch block.  Personally reviewed  A/P  1 coronary artery disease-patient denies chest pain.  Continue medical therapy with aspirin and statin.  Discontinue Brilinta September 2019.  2 history of abdominal aortic aneurysm-follow-up ultrasound today.  3 hypertension-blood pressure is controlled.  Continue present medications.  4 hyperlipidemia-continue statin.  5 transient atrial flutter-patient had previous atrial flutter documented on exercise treadmill.  We  will continue with beta-blockade.  He has declined anticoagulation in the past and understands the higher risk of CVA.  6 preoperative evaluation-patient states he may have back surgery in the near future.  He is not having chest pain.  He may proceed without further cardiac evaluation.  If surgery is scheduled prior to September would discontinue Brilinta 7 days prior to procedure.  Kirk Ruths, MD

## 2018-01-08 ENCOUNTER — Ambulatory Visit (INDEPENDENT_AMBULATORY_CARE_PROVIDER_SITE_OTHER): Payer: Medicare PPO | Admitting: Cardiology

## 2018-01-08 ENCOUNTER — Ambulatory Visit (HOSPITAL_COMMUNITY)
Admission: RE | Admit: 2018-01-08 | Discharge: 2018-01-08 | Disposition: A | Discharge status: Home / Self Care | Payer: Medicare PPO | Source: Ambulatory Visit | Attending: Cardiology | Admitting: Cardiology

## 2018-01-08 ENCOUNTER — Other Ambulatory Visit: Payer: Self-pay | Admitting: *Deleted

## 2018-01-08 ENCOUNTER — Encounter: Payer: Self-pay | Admitting: Cardiology

## 2018-01-08 VITALS — BP 102/60 | HR 58 | Ht 73.0 in | Wt 292.0 lb

## 2018-01-08 DIAGNOSIS — I2510 Atherosclerotic heart disease of native coronary artery without angina pectoris: Secondary | ICD-10-CM

## 2018-01-08 DIAGNOSIS — I714 Abdominal aortic aneurysm, without rupture, unspecified: Secondary | ICD-10-CM

## 2018-01-08 DIAGNOSIS — E7800 Pure hypercholesterolemia, unspecified: Secondary | ICD-10-CM | POA: Diagnosis not present

## 2018-01-08 DIAGNOSIS — I10 Essential (primary) hypertension: Secondary | ICD-10-CM | POA: Diagnosis not present

## 2018-01-08 NOTE — Patient Instructions (Signed)
Medication Instructions:   STOP BRILINTA September 1ST  Follow-Up:  Your physician wants you to follow-up in: [place] will receive a reminder letter in the mail two months in advance. If you don't receive a letter, please call our office to schedule the follow-up appointment.   If you need a refill on your cardiac medications before your next appointment, please call your pharmacy.

## 2018-01-20 DIAGNOSIS — M5440 Lumbago with sciatica, unspecified side: Secondary | ICD-10-CM | POA: Diagnosis not present

## 2018-01-20 DIAGNOSIS — M159 Polyosteoarthritis, unspecified: Secondary | ICD-10-CM | POA: Diagnosis not present

## 2018-01-20 DIAGNOSIS — I10 Essential (primary) hypertension: Secondary | ICD-10-CM | POA: Diagnosis not present

## 2018-01-20 DIAGNOSIS — I2510 Atherosclerotic heart disease of native coronary artery without angina pectoris: Secondary | ICD-10-CM | POA: Diagnosis not present

## 2018-01-24 ENCOUNTER — Other Ambulatory Visit (HOSPITAL_COMMUNITY): Payer: Self-pay | Admitting: Pulmonary Disease

## 2018-01-24 ENCOUNTER — Telehealth: Payer: Self-pay | Admitting: Cardiology

## 2018-01-24 ENCOUNTER — Other Ambulatory Visit: Payer: Self-pay | Admitting: Pulmonary Disease

## 2018-01-24 ENCOUNTER — Encounter: Payer: Self-pay | Admitting: Cardiology

## 2018-01-24 DIAGNOSIS — M5441 Lumbago with sciatica, right side: Secondary | ICD-10-CM

## 2018-01-24 DIAGNOSIS — M5442 Lumbago with sciatica, left side: Principal | ICD-10-CM

## 2018-01-24 NOTE — Telephone Encounter (Signed)
Called [facility] @ [facility] with stent info outlined in last MD note. Also re-faxed this office note via epic fax

## 2018-01-24 NOTE — Telephone Encounter (Signed)
Patient called. Will fax last MD note to [facility]

## 2018-01-24 NOTE — Telephone Encounter (Signed)
Gregory Wilson with [facility] needs a call back on what type of Stints before 8/1 apt. Thanks.

## 2018-01-24 NOTE — Telephone Encounter (Signed)
New message  Pt is having procedure done at [facility], pt states that they want to know what kind of stents that he has. The fax # [phone]. Advised pt to have [facility] call for a clearance.

## 2018-01-24 NOTE — Telephone Encounter (Signed)
Patient called. Explained that last MD note details what types of stents he had placed in 10/2001. Will fax to [facility].

## 2018-01-30 ENCOUNTER — Inpatient Hospital Stay
Admission: RE | Admit: 2018-01-30 | Discharge: 2018-01-30 | Disposition: A | Discharge status: Home / Self Care | Payer: Medicare PPO | Source: Ambulatory Visit | Attending: Pulmonary Disease | Admitting: Pulmonary Disease

## 2018-01-31 ENCOUNTER — Ambulatory Visit (HOSPITAL_COMMUNITY): Payer: Medicare PPO

## 2018-02-06 ENCOUNTER — Ambulatory Visit
Admission: RE | Admit: 2018-02-06 | Discharge: 2018-02-06 | Disposition: A | Discharge status: Home / Self Care | Payer: Medicare PPO | Source: Ambulatory Visit | Attending: Pulmonary Disease | Admitting: Pulmonary Disease

## 2018-02-06 DIAGNOSIS — M5442 Lumbago with sciatica, left side: Principal | ICD-10-CM

## 2018-02-06 DIAGNOSIS — M48061 Spinal stenosis, lumbar region without neurogenic claudication: Secondary | ICD-10-CM | POA: Diagnosis not present

## 2018-02-06 DIAGNOSIS — M5441 Lumbago with sciatica, right side: Secondary | ICD-10-CM

## 2018-03-17 ENCOUNTER — Other Ambulatory Visit: Payer: Self-pay | Admitting: Cardiology

## 2018-03-18 DIAGNOSIS — M5136 Other intervertebral disc degeneration, lumbar region: Secondary | ICD-10-CM | POA: Diagnosis not present

## 2018-03-18 DIAGNOSIS — M5137 Other intervertebral disc degeneration, lumbosacral region: Secondary | ICD-10-CM | POA: Diagnosis not present

## 2018-03-18 DIAGNOSIS — M545 Low back pain: Secondary | ICD-10-CM | POA: Diagnosis not present

## 2018-04-08 DIAGNOSIS — M545 Low back pain: Secondary | ICD-10-CM | POA: Diagnosis not present

## 2018-04-08 DIAGNOSIS — M5137 Other intervertebral disc degeneration, lumbosacral region: Secondary | ICD-10-CM | POA: Diagnosis not present

## 2018-04-08 DIAGNOSIS — I10 Essential (primary) hypertension: Secondary | ICD-10-CM | POA: Diagnosis not present

## 2018-04-08 DIAGNOSIS — Z6838 Body mass index (BMI) 38.0-38.9, adult: Secondary | ICD-10-CM | POA: Diagnosis not present

## 2018-04-14 DIAGNOSIS — M5136 Other intervertebral disc degeneration, lumbar region: Secondary | ICD-10-CM | POA: Diagnosis not present

## 2018-05-02 ENCOUNTER — Encounter: Payer: Self-pay | Admitting: Cardiology

## 2018-05-02 ENCOUNTER — Ambulatory Visit (INDEPENDENT_AMBULATORY_CARE_PROVIDER_SITE_OTHER): Payer: Medicare PPO | Admitting: Cardiology

## 2018-05-02 VITALS — BP 112/76 | HR 59 | Resp 16 | Ht 73.0 in | Wt 284.6 lb

## 2018-05-02 DIAGNOSIS — R059 Cough, unspecified: Secondary | ICD-10-CM

## 2018-05-02 DIAGNOSIS — E785 Hyperlipidemia, unspecified: Secondary | ICD-10-CM

## 2018-05-02 DIAGNOSIS — I483 Typical atrial flutter: Secondary | ICD-10-CM

## 2018-05-02 DIAGNOSIS — R634 Abnormal weight loss: Secondary | ICD-10-CM | POA: Diagnosis not present

## 2018-05-02 DIAGNOSIS — Z9861 Coronary angioplasty status: Secondary | ICD-10-CM | POA: Diagnosis not present

## 2018-05-02 DIAGNOSIS — M4800 Spinal stenosis, site unspecified: Secondary | ICD-10-CM | POA: Diagnosis not present

## 2018-05-02 DIAGNOSIS — I4510 Unspecified right bundle-branch block: Secondary | ICD-10-CM | POA: Diagnosis not present

## 2018-05-02 DIAGNOSIS — R05 Cough: Secondary | ICD-10-CM | POA: Diagnosis not present

## 2018-05-02 DIAGNOSIS — R079 Chest pain, unspecified: Secondary | ICD-10-CM | POA: Diagnosis not present

## 2018-05-02 DIAGNOSIS — I10 Essential (primary) hypertension: Secondary | ICD-10-CM | POA: Diagnosis not present

## 2018-05-02 DIAGNOSIS — I2510 Atherosclerotic heart disease of native coronary artery without angina pectoris: Secondary | ICD-10-CM

## 2018-05-02 LAB — CBC
Hematocrit: 43.4 % (ref 37.5–51.0)
Hemoglobin: 14.4 g/dL (ref 13.0–17.7)
MCH: 27.7 pg (ref 26.6–33.0)
MCHC: 33.2 g/dL (ref 31.5–35.7)
MCV: 84 fL (ref 79–97)
Platelets: 213 10*3/uL (ref 150–450)
RBC: 5.19 x10E6/uL (ref 4.14–5.80)
RDW: 16.3 % — ABNORMAL HIGH (ref 12.3–15.4)
WBC: 7.9 10*3/uL (ref 3.4–10.8)

## 2018-05-02 LAB — BASIC METABOLIC PANEL
BUN/Creatinine Ratio: 17 (ref 10–24)
BUN: 29 mg/dL — ABNORMAL HIGH (ref 8–27)
CO2: 18 mmol/L — ABNORMAL LOW (ref 20–29)
Calcium: 9.5 mg/dL (ref 8.6–10.2)
Chloride: 103 mmol/L (ref 96–106)
Creatinine, Ser: 1.68 mg/dL — ABNORMAL HIGH (ref 0.76–1.27)
GFR calc Af Amer: 45 mL/min/{1.73_m2} — ABNORMAL LOW (ref >59)
GFR calc non Af Amer: 39 mL/min/{1.73_m2} — ABNORMAL LOW (ref >59)
Glucose: 99 mg/dL (ref 65–99)
Potassium: 4.9 mmol/L (ref 3.5–5.2)
Sodium: 137 mmol/L (ref 134–144)

## 2018-05-02 LAB — TSH: TSH: 1.57 u[IU]/mL (ref 0.450–4.500)

## 2018-05-02 NOTE — Assessment & Plan Note (Signed)
Check basic labs and a CXR (chronic cough)

## 2018-05-02 NOTE — Assessment & Plan Note (Signed)
Controlled.  

## 2018-05-02 NOTE — Progress Notes (Signed)
05/02/2018 Gregory Wilson   12-Oct-1940  [id number]  Primary Physician [name], MD Primary Cardiologist: Dr Stanford Breed  HPI:  Pleasant 77 y/o AA male followed by Dr Stanford Breed with a history of CAD, s/p LAD DES and Dx BMS in 2003 with late LAD ISR treated with POBA in Sept 2018. The patient stopped his Brilinta Sept 1st this year. He is in the office today with several complaints. He has noticed some chest "tightness" that may last 1-2 minutes. It is not always associated with exertion. He has intermittent Lt arm numbness, not associated with his chest pain or with exertion. He also is concerned because he has lost 8 lbs since July without trying. He denies anorexia, or GI bleeding. He has a chronic cough (never smoked). He did admit to occasional tachycardia with weakness and mild sweating that I suspect is PAF.    Current Outpatient Medications  Medication Sig Dispense Refill  . amLODipine (NORVASC) 10 MG tablet Take 1 tablet (10 mg total) by mouth every morning. KEEP OV. 90 tablet 3  . aspirin EC 81 MG EC tablet Take 1 tablet (81 mg total) by mouth daily.    Marland Kitchen atorvastatin (LIPITOR) 80 MG tablet TAKE 1 TABLET EVERY DAY  AT  6  PM (KEEP MD APT) 90 tablet 1  . docusate sodium 100 MG CAPS Take 100 mg by mouth 2 (two) times daily. 30 capsule 0  . esomeprazole (NEXIUM) 40 MG capsule Take 40 mg by mouth as needed.     . hydrochlorothiazide (MICROZIDE) 12.5 MG capsule Take 1 capsule (12.5 mg total) by mouth every morning. 90 capsule 2  . irbesartan (AVAPRO) 300 MG tablet TAKE 1 TABLET EVERY DAY (KEEP MD APT) 90 tablet 1  . metoprolol succinate (TOPROL-XL) 100 MG 24 hr tablet Take 1 tablet (100 mg total) by mouth daily. KEEP OV. 90 tablet 1  . nitroGLYCERIN (NITROSTAT) 0.4 MG SL tablet Place 1 tablet (0.4 mg total) under the tongue every 5 (five) minutes x 3 doses as needed for chest pain. 25 tablet 2  . potassium chloride SA (K-DUR,KLOR-CON) 20 MEQ tablet Take 1 tablet (20 mEq total) by  mouth daily. 30 tablet 5  . benzonatate (TESSALON) 100 MG capsule Take 1 capsule (100 mg total) by mouth every 8 (eight) hours. (Patient not taking: Reported on 01/08/2018) 21 capsule 0   No current facility-administered medications for this visit.     Allergies  Allergen Reactions  . Codeine Nausea And Vomiting    Past Medical History:  Diagnosis Date  . Acute ST elevation myocardial infarction (STEMI) of anterior wall ([place]) 03/07/2017  . Arthritis   . BPH (benign prostatic hypertrophy)   . CAD (coronary artery disease)    a. Lexiscan Myoview (11/2013):  Normal stress nuclear study.  LV Ejection Fraction: 65%  . Complication of anesthesia    hx PONV  . Degenerative joint disease   . GERD (gastroesophageal reflux disease)   . H/O hiatal hernia   . History of atrial flutter    REMOTE HX ON TREADMILL  . History of colon polyps   . Hyperlipidemia   . Hypertension   . IBS (irritable bowel syndrome)   . Lower urinary tract symptoms (LUTS)   . OSA (obstructive sleep apnea) 03/26/2016  . RBBB (right bundle branch block)   . S/P drug eluting coronary stent placement    11-26-2001   X2 DES  TO LAD &  BMS  . Schatzki's ring   .  Status post dilation of esophageal narrowing     Social History   Socioeconomic History  . Marital status: Married    Spouse name: Not on file  . Number of children: Not on file  . Years of education: Not on file  . Highest education level: Not on file  Occupational History  . Occupation: retired  Scientific laboratory technician  . Financial resource strain: Not on file  . Food insecurity:    Worry: Not on file    Inability: Not on file  . Transportation needs:    Medical: Not on file    Non-medical: Not on file  Tobacco Use  . Smoking status: Never Smoker  . Smokeless tobacco: Never Used  Substance and Sexual Activity  . Alcohol use: Yes    Comment: on holidays  . Drug use: No  . Sexual activity: Never  Lifestyle  . Physical activity:    Days per week: Not on  file    Minutes per session: Not on file  . Stress: Not on file  Relationships  . Social connections:    Talks on phone: Not on file    Gets together: Not on file    Attends religious service: Not on file    Active member of club or organization: Not on file    Attends meetings of clubs or organizations: Not on file    Relationship status: Not on file  . Intimate partner violence:    Fear of current or ex partner: Not on file    Emotionally abused: Not on file    Physically abused: Not on file    Forced sexual activity: Not on file  Other Topics Concern  . Not on file  Social History Narrative   Retired from [employer]     Family History  Problem Relation Age of Onset  . Cancer Father        Lung  . Hypertension Mother   . Coronary artery disease Mother   . Kidney failure Mother   . Cancer Sister        breast carcinoma  . Hypertension Sister   . Colon cancer Neg Hx      Review of Systems: General: negative for chills, fever, night sweats or weight changes.  Cardiovascular: negative for dyspnea on exertion, edema, orthopnea, paroxysmal nocturnal dyspnea or shortness of breath Dermatological: negative for rash Respiratory: negative for cough or wheezing Urologic: negative for hematuria Abdominal: negative for nausea, vomiting, diarrhea, bright red blood per rectum, melena, or hematemesis Neurologic: negative for visual changes, syncope, or dizziness Chronic back pain-DJD. Bilateral shoulder DJD (needs shoulder replacement) All other systems reviewed and are otherwise negative except as noted above.    Blood pressure 112/76, pulse (!) 59, resp. rate 16, height 6\' 1"  (1.854 m), weight 284 lb 9.6 oz (129.1 kg), SpO2 100 %.  General appearance: alert, cooperative, no distress and mildly obese Neck: no carotid bruit and no JVD Lungs: clear to auscultation bilaterally Heart: regular rate and rhythm Extremities: no edema Skin: warm and dry Neurologic:  Grossly normal  EKG NSR, SB, RBBB  ASSESSMENT AND PLAN:   Chest pain with moderate risk of acute coronary syndrome He has had some intermittent chest tightness that he is concerned is related to his CAD (though his presenting symptoms in September 2018 were weakness-not chest pain)  CAD S/P percutaneous coronary angioplasty LAD DES, Dx BMS- 2003 ISR of the LAD Sept 2018- POBA. Brilinta stopped 03/02/18  Essential hypertension Controlled  RBBB Chronic RBBB and bradycardia  Atrial flutter (HCC) Two documented episodes of atrial flutter- pt declines long term anticoagulation  Unintentional weight loss Check basic labs and a CXR (chronic cough)   PLAN  I suggested we proceed with a Lexiscan Myoview. I'm more concerned with his history of chest tightness that Lt arm numbness which I think is probably related to his shoulder DJD. He is concerned about his unintentional weight loss and I ordered labs and a CXR.  He should f/u with his PCP about this.    Kerin Ransom PA-C 05/02/2018 10:46 AM

## 2018-05-02 NOTE — Assessment & Plan Note (Signed)
LAD DES, Dx BMS- 2003 ISR of the LAD Sept 2018- POBA.

## 2018-05-02 NOTE — Assessment & Plan Note (Signed)
Chronic RBBB and bradycardia

## 2018-05-02 NOTE — Patient Instructions (Addendum)
Medication Instructions:  Your physician recommends that you continue on your current medications as directed. Please refer to the Current Medication list given to you today.  If you need a refill on your cardiac medications before your next appointment, please call your pharmacy.   Lab work: Your physician recommends that you return for lab work in: TODAY-BMET, CBC, TSH If you have labs (blood work) drawn today and your tests are completely normal, you will receive your results only by: Marland Kitchen MyChart Message (if you have MyChart) OR . A paper copy in the mail If you have any lab test that is abnormal or we need to change your treatment, we will call you to review the results.  Testing/Procedures: Your physician has requested that you have a lexiscan myoview. For further information please visit [email or web address]. Please follow instruction sheet, as given.  A chest x-ray takes a picture of the organs and structures inside the chest, including the heart, lungs, and blood vessels. This test can show several things, including, whether the heart is enlarges; whether fluid is building up in the lungs; and whether pacemaker / defibrillator leads are still in place. Tora Duck [phone]  Follow-Up: At [facility], you and your health needs are our priority.  As part of our continuing mission to provide you with exceptional heart care, we have created designated Provider Care Teams.  These Care Teams include your primary Cardiologist (physician) and Advanced Practice Providers (APPs -  Physician Assistants and Nurse Practitioners) who all work together to provide you with the care you need, when you need it.  Your physician recommends that you schedule a follow-up appointment in: 3 MONTHS with DR CRENSHAW.  Any Other Special Instructions Will Be Listed Below (If Applicable).

## 2018-05-02 NOTE — Assessment & Plan Note (Signed)
He has had some intermittent chest tightness that he is concerned is related to his CAD (though his presenting symptoms in September 2018 were weakness-not chest pain)

## 2018-05-02 NOTE — Assessment & Plan Note (Signed)
Two documented episodes of atrial flutter- pt declines long term anticoagulation

## 2018-05-03 NOTE — Progress Notes (Signed)
Add plavix 75 mg daily Kirk Ruths

## 2018-05-06 ENCOUNTER — Telehealth: Payer: Self-pay

## 2018-05-06 MED ORDER — CLOPIDOGREL BISULFATE 75 MG PO TABS: 75.0000 mg | ORAL_TABLET | Freq: Every day | ORAL | 3 refills | Status: DC

## 2018-05-06 NOTE — Telephone Encounter (Signed)
-----   Message from Erlene Quan, [place] sent at 05/05/2018  9:56 AM EST ----- T can you tell Mr Marschall that Dr Stanford Breed has reviewed his chart and wants him to start Plavix 75 mg daily-in addition to his aspirin.   Kerin Ransom PA-C 05/05/2018 9:57 AM

## 2018-05-06 NOTE — Telephone Encounter (Signed)
Patient has been notified directly and voiced understanding. Rx sent to pharmacy.

## 2018-05-08 ENCOUNTER — Encounter (HOSPITAL_COMMUNITY): Payer: Self-pay | Admitting: *Deleted

## 2018-05-08 ENCOUNTER — Other Ambulatory Visit: Payer: Self-pay

## 2018-05-08 ENCOUNTER — Emergency Department (HOSPITAL_COMMUNITY)
Admission: EM | Admit: 2018-05-08 | Discharge: 2018-05-08 | Disposition: A | Discharge status: Home / Self Care | Payer: Medicare PPO | Attending: Emergency Medicine | Admitting: Emergency Medicine

## 2018-05-08 DIAGNOSIS — I2510 Atherosclerotic heart disease of native coronary artery without angina pectoris: Secondary | ICD-10-CM | POA: Diagnosis not present

## 2018-05-08 DIAGNOSIS — I10 Essential (primary) hypertension: Secondary | ICD-10-CM | POA: Diagnosis not present

## 2018-05-08 DIAGNOSIS — Z7982 Long term (current) use of aspirin: Secondary | ICD-10-CM | POA: Diagnosis not present

## 2018-05-08 DIAGNOSIS — R319 Hematuria, unspecified: Secondary | ICD-10-CM | POA: Insufficient documentation

## 2018-05-08 DIAGNOSIS — Z79899 Other long term (current) drug therapy: Secondary | ICD-10-CM | POA: Insufficient documentation

## 2018-05-08 LAB — URINALYSIS, ROUTINE W REFLEX MICROSCOPIC
BACTERIA UA: NONE SEEN
Bilirubin Urine: NEGATIVE
Glucose, UA: NEGATIVE mg/dL
Ketones, ur: NEGATIVE mg/dL
Leukocytes, UA: NEGATIVE
NITRITE: NEGATIVE
Protein, ur: NEGATIVE mg/dL
RBC / HPF: >50 RBC/hpf — ABNORMAL HIGH (ref 0–5)
SPECIFIC GRAVITY, URINE: 1.018 (ref 1.005–1.030)
pH: 5 (ref 5.0–8.0)

## 2018-05-08 LAB — CBC WITH DIFFERENTIAL/PLATELET
Abs Immature Granulocytes: 0.04 10*3/uL (ref 0.00–0.07)
BASOS ABS: 0 10*3/uL (ref 0.0–0.1)
Basophils Relative: 0 %
EOS PCT: 2 %
Eosinophils Absolute: 0.1 10*3/uL (ref 0.0–0.5)
HEMATOCRIT: 42.3 % (ref 39.0–52.0)
Hemoglobin: 12.8 g/dL — ABNORMAL LOW (ref 13.0–17.0)
Immature Granulocytes: 1 %
LYMPHS ABS: 1.2 10*3/uL (ref 0.7–4.0)
Lymphocytes Relative: 18 %
MCH: 26.3 pg (ref 26.0–34.0)
MCHC: 30.3 g/dL (ref 30.0–36.0)
MCV: 86.9 fL (ref 80.0–100.0)
Monocytes Absolute: 0.5 10*3/uL (ref 0.1–1.0)
Monocytes Relative: 7 %
NEUTROS PCT: 72 %
NRBC: 0 % (ref 0.0–0.2)
Neutro Abs: 4.9 10*3/uL (ref 1.7–7.7)
Platelets: 167 10*3/uL (ref 150–400)
RBC: 4.87 MIL/uL (ref 4.22–5.81)
RDW: 17.9 % — AB (ref 11.5–15.5)
WBC: 6.8 10*3/uL (ref 4.0–10.5)

## 2018-05-08 LAB — BASIC METABOLIC PANEL
Anion gap: 6 (ref 5–15)
BUN: 24 mg/dL — ABNORMAL HIGH (ref 8–23)
CALCIUM: 8.7 mg/dL — AB (ref 8.9–10.3)
CO2: 22 mmol/L (ref 22–32)
CREATININE: 1.59 mg/dL — AB (ref 0.61–1.24)
Chloride: 108 mmol/L (ref 98–111)
GFR, EST AFRICAN AMERICAN: 47 mL/min — AB (ref >60)
GFR, EST NON AFRICAN AMERICAN: 41 mL/min — AB (ref >60)
Glucose, Bld: 115 mg/dL — ABNORMAL HIGH (ref 70–99)
Potassium: 4.7 mmol/L (ref 3.5–5.1)
Sodium: 136 mmol/L (ref 135–145)

## 2018-05-08 MED ORDER — CEPHALEXIN 500 MG PO CAPS: 500.0000 mg | ORAL_CAPSULE | Freq: Four times a day (QID) | ORAL | 0 refills | Status: DC

## 2018-05-08 NOTE — ED Notes (Signed)
Pt has 86mL of urine in bladder

## 2018-05-08 NOTE — Discharge Instructions (Signed)
Urine culture has been sent and is pending.  This will help determine whether the blood in the urine is secondary to an infection.  Take the antibiotic as directed.  Make an appointment to follow-up with urology they should she see you sometime in the next 2 weeks.  Return for any new or worse symptoms return for the inability to urinate properly.

## 2018-05-08 NOTE — ED Notes (Signed)
Pt states that he has been bleeding from penis since yesterday at 5 pm. States that he passed a lot of blood in urine and then was not able to urinate as much as normal. Pt wife states that pt has had prostate surgery in 2015 and has had bleeding episodes since then but yesterday was more than usual.

## 2018-05-08 NOTE — ED Provider Notes (Signed)
[facility] EMERGENCY DEPARTMENT Provider Note   CSN: [id number] Arrival date & time: 05/08/18  8841     History   Chief Complaint Chief Complaint  Patient presents with  . Hematuria    HPI Gregory Wilson is a 77 y.o. male.  Patient presenting with the complaint of blood in the urine that started yesterday.  Throughout the 66A was not certain whether he was emptying his bladder completely.  He urinated several times.  Occasionally did pass some clots.  Patient is on Plavix but no other blood thinners.  Patient has a known history of prostatic hypertrophy followed by urology on a yearly basis.  In 2015 he had a procedure to help with that.  But none since.  Patient denies any significant abdominal pain any back pain or nausea or vomiting.  He also was told that his renal function is getting a little bit worse.      Past Medical History:  Diagnosis Date  . Acute ST elevation myocardial infarction (STEMI) of anterior wall ([place]) 03/07/2017  . Arthritis   . BPH (benign prostatic hypertrophy)   . CAD (coronary artery disease)    a. Lexiscan Myoview (11/2013):  Normal stress nuclear study.  LV Ejection Fraction: 65%  . Complication of anesthesia    hx PONV  . Degenerative joint disease   . GERD (gastroesophageal reflux disease)   . H/O hiatal hernia   . History of atrial flutter    REMOTE HX ON TREADMILL  . History of colon polyps   . Hyperlipidemia   . Hypertension   . IBS (irritable bowel syndrome)   . Lower urinary tract symptoms (LUTS)   . OSA (obstructive sleep apnea) 03/26/2016  . RBBB (right bundle branch block)   . S/P drug eluting coronary stent placement    11-26-2001   X2 DES  TO LAD &  BMS  . Schatzki's ring   . Status post dilation of esophageal narrowing     Patient Active Problem List   Diagnosis Date Noted  . RBBB 05/02/2018  . Chest pain with moderate risk of acute coronary syndrome 05/02/2018  . Unintentional weight loss 05/02/2018  . Morbid obesity  ([place]) 03/09/2017  . Acute ST elevation myocardial infarction (STEMI) of anterior wall ([place]) 03/07/2017  . STEMI involving left anterior descending coronary artery ([place]) 03/07/2017  . OSA (obstructive sleep apnea) 03/26/2016  . Bruit 11/24/2015  . Hypertrophy of prostate with urinary obstruction and other lower urinary tract symptoms (LUTS) 12/21/2013  . Personal history of colonic polyps 08/11/2013  . Dysphagia, unspecified(787.20) 09/11/2012  . Constipation 05/01/2011  . ABDOMINAL PAIN 07/11/2009  . DYSPEPSIA 11/12/2008  . DYSPNEA 10/21/2008  . Dyslipidemia, goal LDL below 70 10/20/2008  . Essential hypertension 10/20/2008  . CAD S/P percutaneous coronary angioplasty 10/20/2008  . Atrial flutter ([place]) 10/20/2008  . Esophageal reflux 10/20/2008  . HIATAL HERNIA 10/20/2008  . Spinal stenosis 10/20/2008  . ARTHRITIS 10/20/2008  . OTHER DYSPHAGIA 10/20/2008  . BENIGN PROSTATIC HYPERTROPHY, HX OF 10/20/2008    Past Surgical History:  Procedure Laterality Date  . APPENDECTOMY    . CARDIOVASCULAR STRESS TEST  12-09-2013   NUCLEAR LEXISCAN STUDY--  RESULTS PENDING  . CARDIOVASCULAR STRESS TEST  04/2011  DR CRENSHAW   NORMAL /  NO ISCHEMIA/  EF 67%  . CHOLECYSTECTOMY    . COLONOSCOPY  05/29/2011   RMR: tubular adenoma  . CORONARY ANGIOPLASTY WITH STENT PLACEMENT  11-26-2001  DR Eustace Quail   DES (Cypher)  to LAD 70%/  BMS (Express) to OSTIAL D1 80%/   CFX  &  RCA normal/  EF 65%  . CORONARY/GRAFT ACUTE MI REVASCULARIZATION N/A 03/07/2017   Procedure: Coronary/Graft Acute MI Revascularization;  Surgeon: Sherren Mocha, MD;  Location: [facility];  Service: Cardiovascular;  Laterality: N/A;  . ESOPHAGOGASTRODUODENOSCOPY  04/05/2004   RMR: Small hiatal hernia/Questionable subtle Schatzki's ring, status post dilation as described  . ESOPHAGOGASTRODUODENOSCOPY  05/2011   RMR: small hiatal hernia, empiric dilation with 68F and 68F Maloney  . ESOPHAGOGASTRODUODENOSCOPY (EGD) WITH  ESOPHAGEAL DILATION N/A 01/16/2013   Dr. Rourk:baggy somewhat atonic esophagus-query underlying esophageal motility disorder. Status post passage of a Maloney dilator (large bore) empirically. Status post esophageal biopsy/Reflux symptoms are well controlled on Nexium.benign path  . LEFT HEART CATH AND CORONARY ANGIOGRAPHY N/A 03/07/2017   Procedure: LEFT HEART CATH AND CORONARY ANGIOGRAPHY;  Surgeon: Sherren Mocha, MD;  Location: [facility];  Service: Cardiovascular;  Laterality: N/A;  . TOTAL HIP ARTHROPLASTY Left 09-07-2003  . TOTAL KNEE ARTHROPLASTY Left 07-27-2002  . TRANSURETHRAL RESECTION OF PROSTATE N/A 12/21/2013   Procedure: TRANSURETHRAL RESECTION OF THE PROSTATE WITH GYRUS INSTRUMENTS;  Surgeon: Jorja Loa, MD;  Location: [facility];  Service: Urology;  Laterality: N/A;        Home Medications    Prior to Admission medications   Medication Sig Start Date End Date Taking? Authorizing Provider  amLODipine (NORVASC) 10 MG tablet Take 1 tablet (10 mg total) by mouth every morning. KEEP OV. 12/13/17  Yes Lelon Perla, MD  aspirin EC 81 MG EC tablet Take 1 tablet (81 mg total) by mouth daily. 03/09/17  Yes Simmons, Brittainy M, PA-C  atorvastatin (LIPITOR) 80 MG tablet TAKE 1 TABLET EVERY DAY  AT  6  PM (KEEP MD APT) 03/17/18  Yes Crenshaw, Denice Bors, MD  benzonatate (TESSALON) 100 MG capsule Take 1 capsule (100 mg total) by mouth every 8 (eight) hours. 02/02/15  Yes Pollina, Gwenyth Allegra, MD  esomeprazole (NEXIUM) 40 MG capsule Take 40 mg by mouth daily as needed.    Yes [provider]  irbesartan (AVAPRO) 300 MG tablet TAKE 1 TABLET EVERY DAY (KEEP MD APT) 03/17/18  Yes Lelon Perla, MD  metoprolol succinate (TOPROL-XL) 100 MG 24 hr tablet Take 1 tablet (100 mg total) by mouth daily. KEEP OV. 06/14/17  Yes Crenshaw, Denice Bors, MD  nitroGLYCERIN (NITROSTAT) 0.4 MG SL tablet Place 1 tablet (0.4 mg total) under the tongue every 5 (five) minutes x  3 doses as needed for chest pain. 03/09/17  Yes Simmons, Brittainy M, PA-C  traMADol (ULTRAM) 50 MG tablet Take 50 mg by mouth daily as needed.   Yes [provider]  cephALEXin (KEFLEX) 500 MG capsule Take 1 capsule (500 mg total) by mouth 4 (four) times daily. 05/08/18   Fredia Sorrow, MD  clopidogrel (PLAVIX) 75 MG tablet Take 1 tablet (75 mg total) by mouth daily. Patient not taking: Reported on 05/08/2018 05/06/18   Lelon Perla, MD    Family History Family History  Problem Relation Age of Onset  . Cancer Father        Lung  . Hypertension Mother   . Coronary artery disease Mother   . Kidney failure Mother   . Cancer Sister        breast carcinoma  . Hypertension Sister   . Colon cancer Neg Hx     Social History Social History  Tobacco Use  . Smoking status: Never Smoker  . Smokeless tobacco: Never Used  Substance Use Topics  . Alcohol use: Yes    Comment: on holidays  . Drug use: No     Allergies   Codeine   Review of Systems Review of Systems  Constitutional: Negative for fever.  HENT: Negative for congestion.   Eyes: Negative for redness.  Respiratory: Negative for shortness of breath.   Cardiovascular: Negative for chest pain.  Gastrointestinal: Negative for abdominal pain.  Genitourinary: Positive for difficulty urinating and hematuria. Negative for dysuria.  Musculoskeletal: Negative for back pain.  Skin: Negative for rash.  Neurological: Negative for syncope.  Hematological: Does not bruise/bleed easily.  Psychiatric/Behavioral: Negative for confusion.     Physical Exam Updated Vital Signs BP 115/69 (BP Location: Left Arm)   Pulse 71   Temp 98.4 F (36.9 C) (Oral)   Resp 16   Ht 1.854 m (6\' 1" )   Wt 129.1 kg   SpO2 100%   BMI 37.55 kg/m   Physical Exam  Constitutional: He is oriented to person, place, and time. He appears well-developed and well-nourished. No distress.  HENT:  Head: Normocephalic and atraumatic.    Mouth/Throat: Oropharynx is clear and moist.  Eyes: Pupils are equal, round, and reactive to light. Conjunctivae and EOM are normal.  Neck: Neck supple.  Cardiovascular: Normal rate, regular rhythm and normal heart sounds.  Pulmonary/Chest: Effort normal and breath sounds normal. No respiratory distress.  Abdominal: Soft. Bowel sounds are normal. There is no tenderness.  Genitourinary: Penis normal.  Genitourinary Comments: No evidence of any groin hernia.  No scrotal or testicular swelling or tenderness.  No discharge.  Musculoskeletal: Normal range of motion.  Neurological: He is alert and oriented to person, place, and time. No cranial nerve deficit or sensory deficit. He exhibits normal muscle tone. Coordination normal.  Skin: Skin is warm.  Nursing note and vitals reviewed.    ED Treatments / Results  Labs (all labs ordered are listed, but only abnormal results are displayed) Labs Reviewed  CBC WITH DIFFERENTIAL/PLATELET - Abnormal; Notable for the following components:      Result Value   Hemoglobin 12.8 (*)    RDW 17.9 (*)    All other components within normal limits  BASIC METABOLIC PANEL - Abnormal; Notable for the following components:   Glucose, Bld 115 (*)    BUN 24 (*)    Creatinine, Ser 1.59 (*)    Calcium 8.7 (*)    GFR calc non Af Amer 41 (*)    GFR calc Af Amer 47 (*)    All other components within normal limits  URINALYSIS, ROUTINE W REFLEX MICROSCOPIC - Abnormal; Notable for the following components:   Hgb urine dipstick MODERATE (*)    RBC / HPF >50 (*)    All other components within normal limits  URINE CULTURE    EKG None  Radiology No results found.  Procedures Procedures (including critical care time)  Medications Ordered in ED Medications - No data to display   Initial Impression / Assessment and Plan / ED Course  I have reviewed the triage vital signs and the nursing notes.  Pertinent labs & imaging results that were available during  my care of the patient were reviewed by me and considered in my medical decision making (see chart for details).     Clearly with blood in the urine.  Exact cause not certain.  Urine culture sent to rule out infection.  We will treat with antibiotics in the meantime.  Patient will need to follow-up with urology referral information provided.  Patient without any urinary retention here today.  Patient known to have some renal insufficiency.  Not significantly worse here today.  Final Clinical Impressions(s) / ED Diagnoses   Final diagnoses:  Hematuria, unspecified type    ED Discharge Orders         Ordered    cephALEXin (KEFLEX) 500 MG capsule  4 times daily     05/08/18 1009           Fredia Sorrow, MD 05/08/18 1014

## 2018-05-08 NOTE — ED Triage Notes (Signed)
Pt c/o blood in his urine and some burning to the end of his penis that started yesterday

## 2018-05-09 ENCOUNTER — Telehealth (HOSPITAL_COMMUNITY): Payer: Self-pay

## 2018-05-09 LAB — URINE CULTURE: Culture: NO GROWTH

## 2018-05-09 NOTE — Telephone Encounter (Signed)
Encounter complete,

## 2018-05-13 DIAGNOSIS — I2510 Atherosclerotic heart disease of native coronary artery without angina pectoris: Secondary | ICD-10-CM | POA: Diagnosis not present

## 2018-05-13 DIAGNOSIS — I10 Essential (primary) hypertension: Secondary | ICD-10-CM | POA: Diagnosis not present

## 2018-05-13 DIAGNOSIS — M159 Polyosteoarthritis, unspecified: Secondary | ICD-10-CM | POA: Diagnosis not present

## 2018-05-13 DIAGNOSIS — R634 Abnormal weight loss: Secondary | ICD-10-CM | POA: Diagnosis not present

## 2018-05-14 ENCOUNTER — Ambulatory Visit (HOSPITAL_COMMUNITY)
Admission: RE | Admit: 2018-05-14 | Discharge: 2018-05-14 | Disposition: A | Discharge status: Home / Self Care | Payer: Medicare PPO | Source: Ambulatory Visit | Attending: Cardiology | Admitting: Cardiology

## 2018-05-14 DIAGNOSIS — M4800 Spinal stenosis, site unspecified: Secondary | ICD-10-CM | POA: Insufficient documentation

## 2018-05-14 DIAGNOSIS — I2510 Atherosclerotic heart disease of native coronary artery without angina pectoris: Secondary | ICD-10-CM | POA: Diagnosis not present

## 2018-05-14 DIAGNOSIS — I4510 Unspecified right bundle-branch block: Secondary | ICD-10-CM

## 2018-05-14 DIAGNOSIS — I10 Essential (primary) hypertension: Secondary | ICD-10-CM | POA: Diagnosis not present

## 2018-05-14 DIAGNOSIS — I483 Typical atrial flutter: Secondary | ICD-10-CM | POA: Insufficient documentation

## 2018-05-14 DIAGNOSIS — R634 Abnormal weight loss: Secondary | ICD-10-CM | POA: Diagnosis not present

## 2018-05-14 DIAGNOSIS — E785 Hyperlipidemia, unspecified: Secondary | ICD-10-CM

## 2018-05-14 DIAGNOSIS — Z9861 Coronary angioplasty status: Secondary | ICD-10-CM

## 2018-05-14 MED ORDER — REGADENOSON 0.4 MG/5ML IV SOLN
0.4000 mg | Freq: Once | INTRAVENOUS | Status: AC
  Administered 2018-05-14: 0.4 mg via INTRAVENOUS

## 2018-05-14 MED ORDER — TECHNETIUM TC 99M TETROFOSMIN IV KIT
29.9000 | PACK | Freq: Once | INTRAVENOUS | Status: AC | PRN
  Administered 2018-05-14: 29.9 via INTRAVENOUS
  Filled 2018-05-14: qty 30

## 2018-05-15 ENCOUNTER — Ambulatory Visit (HOSPITAL_COMMUNITY)
Admission: RE | Admit: 2018-05-15 | Discharge: 2018-05-15 | Disposition: A | Discharge status: Home / Self Care | Payer: Medicare PPO | Source: Ambulatory Visit | Attending: Cardiology | Admitting: Cardiology

## 2018-05-15 LAB — MYOCARDIAL PERFUSION IMAGING
LV dias vol: 67 mL (ref 62–150)
LV sys vol: 21 mL
Peak HR: 86 {beats}/min
Rest HR: 60 {beats}/min
SDS: 2
SRS: 3
SSS: 5
TID: 0.7

## 2018-05-15 MED ORDER — TECHNETIUM TC 99M TETROFOSMIN IV KIT
30.8000 | PACK | Freq: Once | INTRAVENOUS | Status: AC | PRN
  Administered 2018-05-15: 30.8 via INTRAVENOUS

## 2018-05-27 DIAGNOSIS — R42 Dizziness and giddiness: Secondary | ICD-10-CM | POA: Diagnosis not present

## 2018-05-27 DIAGNOSIS — K210 Gastro-esophageal reflux disease with esophagitis: Secondary | ICD-10-CM | POA: Diagnosis not present

## 2018-05-27 DIAGNOSIS — N401 Enlarged prostate with lower urinary tract symptoms: Secondary | ICD-10-CM | POA: Diagnosis not present

## 2018-05-27 DIAGNOSIS — I10 Essential (primary) hypertension: Secondary | ICD-10-CM | POA: Diagnosis not present

## 2018-05-27 DIAGNOSIS — R634 Abnormal weight loss: Secondary | ICD-10-CM | POA: Diagnosis not present

## 2018-05-27 DIAGNOSIS — I2510 Atherosclerotic heart disease of native coronary artery without angina pectoris: Secondary | ICD-10-CM | POA: Diagnosis not present

## 2018-05-27 DIAGNOSIS — M159 Polyosteoarthritis, unspecified: Secondary | ICD-10-CM | POA: Diagnosis not present

## 2018-05-27 DIAGNOSIS — R739 Hyperglycemia, unspecified: Secondary | ICD-10-CM | POA: Diagnosis not present

## 2018-05-27 DIAGNOSIS — E785 Hyperlipidemia, unspecified: Secondary | ICD-10-CM | POA: Diagnosis not present

## 2018-05-28 ENCOUNTER — Telehealth: Payer: Self-pay | Admitting: *Deleted

## 2018-05-28 NOTE — Telephone Encounter (Signed)
   [facility] Pre-operative Risk Assessment    Request for surgical clearance:  1. What type of surgery is being performed? EPIDURAL STEROID INJECTION  2. When is this surgery scheduled? TBD  3. What type of clearance is required (medical clearance vs. Pharmacy clearance to hold med vs. Both)? BOTH  4. Are there any medications that need to be held prior to surgery and how long? PLAVIX FOR 7 DAYS  5. Practice name and name of physician performing surgery? [facility]  6. What is your office phone number [phone]   7.   What is your office fax number [phone]  8.   Anesthesia type (None, local, MAC, general) ? CHOICE   Devra Dopp 05/28/2018, 8:38 AM  _________________________________________________________________   (provider comments below)

## 2018-06-03 NOTE — Telephone Encounter (Signed)
   Primary Cardiologist: Kirk [facility], MD  Chart reviewed as part of pre-operative protocol coverage. Spoke with patient. He denies any recurrent CP. No new cardiac sx otherwise. Low risk nuc recently. Given past medical history and time since last visit, based on ACC/AHA guidelines, KENDYN ZAMAN would be at acceptable risk for the planned procedure without further cardiovascular testing. Dr. Stanford Breed reviewed chart and stated, "Ok to hold plavix for 7 days prior to procedure and resume after."  I will route this recommendation to the requesting party via [place] fax function and remove from pre-op pool.  Please call with questions.  Charlie Pitter, PA-C 06/03/2018, 5:00 PM

## 2018-06-03 NOTE — Telephone Encounter (Signed)
Ok to hold plavix for 7 days prior to procedure and resume after. Kirk [facility]

## 2018-06-03 NOTE — Telephone Encounter (Signed)
   Primary Cardiologist: Kirk [facility], MD  Chart reviewed as part of pre-operative protocol coverage.   Pt with history of CAD, s/p LAD DES and Dx BMS in 2003 with late LAD ISR treated with POBA in Sept 2018. Saw Kerin Ransom 05/2018 with chest tightness, L arm numbness, unintentional weight loss. Nuclear stress test 05/15/18 was normal, EF 69%. Dr. Stanford Breed recommended to add Plavix Pt will need phone call but will route to Dr. Stanford Breed to find out if OK to hold Plavix as requested for 7 days. Dr. Stanford Breed - Please route response to P CV DIV PREOP (the pre-op pool). Thank you.   Charlie Pitter, PA-C 06/03/2018, 3:11 PM

## 2018-06-13 DIAGNOSIS — R310 Gross hematuria: Secondary | ICD-10-CM | POA: Diagnosis not present

## 2018-06-13 DIAGNOSIS — N400 Enlarged prostate without lower urinary tract symptoms: Secondary | ICD-10-CM | POA: Diagnosis not present

## 2018-06-16 DIAGNOSIS — M5136 Other intervertebral disc degeneration, lumbar region: Secondary | ICD-10-CM | POA: Diagnosis not present

## 2018-07-04 DIAGNOSIS — R310 Gross hematuria: Secondary | ICD-10-CM | POA: Diagnosis not present

## 2018-07-04 DIAGNOSIS — R319 Hematuria, unspecified: Secondary | ICD-10-CM | POA: Diagnosis not present

## 2018-07-15 DIAGNOSIS — M5136 Other intervertebral disc degeneration, lumbar region: Secondary | ICD-10-CM | POA: Diagnosis not present

## 2018-07-15 DIAGNOSIS — Z6839 Body mass index (BMI) 39.0-39.9, adult: Secondary | ICD-10-CM | POA: Diagnosis not present

## 2018-07-15 DIAGNOSIS — M545 Low back pain: Secondary | ICD-10-CM | POA: Diagnosis not present

## 2018-07-15 DIAGNOSIS — I10 Essential (primary) hypertension: Secondary | ICD-10-CM | POA: Diagnosis not present

## 2018-07-23 NOTE — Progress Notes (Signed)
HPI: FU CAD s/p DES to LAD and BMS to Dx in 2003,PTCA LAD 9/18,HL, HTN, AFlutter (transient on treadmill - declined coumadin). Seen November 2019 with hematuria.  Since last seen, he denies dyspnea, chest pain, palpitations or syncope.  Studies: - LHC (10/2001): Mid LAD 70%, Ostial D1 80%, CFX and RCA normal, EF 65%. PCI: Cypher DES to LAD and Express BMS to Dx. -LHC September 2018 showed anterior infarct secondary to partially occlusive thrombus in prior LAD stent successfully treated with heparin, Aggrastat and balloon angioplasty. No other obstructive disease noted. Antiplatelet therapy recommended long-term. - Echocardiogram September 2018 showed normal LV function. - Nuclear (11/19): No ischemia, EF 69%,  -Abdominal ultrasound(7/19): Abdominal aortic ectasia measuring 2.8 cm decreased from 3.3 cm previously.  Right common iliac 1.5 cm.  Follow-up recommended 24 months.  Current Outpatient Medications  Medication Sig Dispense Refill  . amLODipine (NORVASC) 10 MG tablet Take 1 tablet (10 mg total) by mouth every morning. KEEP OV. 90 tablet 3  . aspirin EC 81 MG EC tablet Take 1 tablet (81 mg total) by mouth daily.    Marland Kitchen atorvastatin (LIPITOR) 80 MG tablet TAKE 1 TABLET EVERY DAY  AT  6  PM (KEEP MD APT) 90 tablet 1  . benzonatate (TESSALON) 100 MG capsule Take 1 capsule (100 mg total) by mouth every 8 (eight) hours. 21 capsule 0  . clopidogrel (PLAVIX) 75 MG tablet Take 1 tablet (75 mg total) by mouth daily. 90 tablet 3  . esomeprazole (NEXIUM) 40 MG capsule Take 40 mg by mouth daily as needed.     . irbesartan (AVAPRO) 300 MG tablet TAKE 1 TABLET EVERY DAY (KEEP MD APT) 90 tablet 1  . metoprolol succinate (TOPROL-XL) 100 MG 24 hr tablet Take 1 tablet (100 mg total) by mouth daily. KEEP OV. 90 tablet 1  . nitroGLYCERIN (NITROSTAT) 0.4 MG SL tablet Place 1 tablet (0.4 mg total) under the tongue every 5 (five) minutes x 3 doses as needed for chest pain. 25 tablet 2  .  traMADol (ULTRAM) 50 MG tablet Take 50 mg by mouth daily as needed.     No current facility-administered medications for this visit.      Past Medical History:  Diagnosis Date  . Acute ST elevation myocardial infarction (STEMI) of anterior wall ([place]) 03/07/2017  . Arthritis   . BPH (benign prostatic hypertrophy)   . CAD (coronary artery disease)    a. Lexiscan Myoview (11/2013):  Normal stress nuclear study.  LV Ejection Fraction: 65%  . Complication of anesthesia    hx PONV  . Degenerative joint disease   . GERD (gastroesophageal reflux disease)   . H/O hiatal hernia   . History of atrial flutter    REMOTE HX ON TREADMILL  . History of colon polyps   . Hyperlipidemia   . Hypertension   . IBS (irritable bowel syndrome)   . Lower urinary tract symptoms (LUTS)   . OSA (obstructive sleep apnea) 03/26/2016  . RBBB (right bundle branch block)   . S/P drug eluting coronary stent placement    11-26-2001   X2 DES  TO LAD &  BMS  . Schatzki's ring   . Status post dilation of esophageal narrowing     Past Surgical History:  Procedure Laterality Date  . APPENDECTOMY    . CARDIOVASCULAR STRESS TEST  12-09-2013   NUCLEAR LEXISCAN STUDY--  RESULTS PENDING  . CARDIOVASCULAR STRESS TEST  04/2011  DR Stanford Breed  NORMAL /  NO ISCHEMIA/  EF 67%  . CHOLECYSTECTOMY    . COLONOSCOPY  05/29/2011   RMR: tubular adenoma  . CORONARY ANGIOPLASTY WITH STENT PLACEMENT  11-26-2001  DR Eustace Quail   DES (Cypher) to LAD 70%/  BMS (Express) to OSTIAL D1 80%/   CFX  &  RCA normal/  EF 65%  . CORONARY/GRAFT ACUTE MI REVASCULARIZATION N/A 03/07/2017   Procedure: Coronary/Graft Acute MI Revascularization;  Surgeon: Sherren Mocha, MD;  Location: [facility];  Service: Cardiovascular;  Laterality: N/A;  . ESOPHAGOGASTRODUODENOSCOPY  04/05/2004   RMR: Small hiatal hernia/Questionable subtle Schatzki's ring, status post dilation as described  . ESOPHAGOGASTRODUODENOSCOPY  05/2011   RMR: small hiatal  hernia, empiric dilation with 51F and 71F Maloney  . ESOPHAGOGASTRODUODENOSCOPY (EGD) WITH ESOPHAGEAL DILATION N/A 01/16/2013   Dr. Rourk:baggy somewhat atonic esophagus-query underlying esophageal motility disorder. Status post passage of a Maloney dilator (large bore) empirically. Status post esophageal biopsy/Reflux symptoms are well controlled on Nexium.benign path  . LEFT HEART CATH AND CORONARY ANGIOGRAPHY N/A 03/07/2017   Procedure: LEFT HEART CATH AND CORONARY ANGIOGRAPHY;  Surgeon: Sherren Mocha, MD;  Location: [facility];  Service: Cardiovascular;  Laterality: N/A;  . TOTAL HIP ARTHROPLASTY Left 09-07-2003  . TOTAL KNEE ARTHROPLASTY Left 07-27-2002  . TRANSURETHRAL RESECTION OF PROSTATE N/A 12/21/2013   Procedure: TRANSURETHRAL RESECTION OF THE PROSTATE WITH GYRUS INSTRUMENTS;  Surgeon: Jorja Loa, MD;  Location: [facility];  Service: Urology;  Laterality: N/A;    Social History   Socioeconomic History  . Marital status: Married    Spouse name: Not on file  . Number of children: Not on file  . Years of education: Not on file  . Highest education level: Not on file  Occupational History  . Occupation: retired  Scientific laboratory technician  . Financial resource strain: Not on file  . Food insecurity:    Worry: Not on file    Inability: Not on file  . Transportation needs:    Medical: Not on file    Non-medical: Not on file  Tobacco Use  . Smoking status: Never Smoker  . Smokeless tobacco: Never Used  Substance and Sexual Activity  . Alcohol use: Yes    Comment: on holidays  . Drug use: No  . Sexual activity: Never  Lifestyle  . Physical activity:    Days per week: Not on file    Minutes per session: Not on file  . Stress: Not on file  Relationships  . Social connections:    Talks on phone: Not on file    Gets together: Not on file    Attends religious service: Not on file    Active member of club or organization: Not on file    Attends meetings  of clubs or organizations: Not on file    Relationship status: Not on file  . Intimate partner violence:    Fear of current or ex partner: Not on file    Emotionally abused: Not on file    Physically abused: Not on file    Forced sexual activity: Not on file  Other Topics Concern  . Not on file  Social History Narrative   Retired from [employer]    Family History  Problem Relation Age of Onset  . Cancer Father        Lung  . Hypertension Mother   . Coronary artery disease Mother   . Kidney failure Mother   .  Cancer Sister        breast carcinoma  . Hypertension Sister   . Colon cancer Neg Hx     ROS: Back pain but no fevers or chills, productive cough, hemoptysis, dysphasia, odynophagia, melena, hematochezia, dysuria, hematuria, rash, seizure activity, orthopnea, PND, pedal edema, claudication. Remaining systems are negative.  Physical Exam: Well-developed obese in no acute distress.  Skin is warm and dry.  HEENT is normal.  Neck is supple.  Chest is clear to auscultation with normal expansion.  Cardiovascular exam is regular rate and rhythm.  Abdominal exam nontender or distended. No masses palpated. Extremities show no edema. neuro grossly intact  A/P  1 coronary artery disease-patient denies chest pain.  Plan to continue medical therapy with aspirin and statin.  2 abdominal aortic aneurysm-plan follow-up ultrasound July 2021.  3 hypertension-patient's blood pressure is controlled today.  Plan to continue present medications and follow.  Check potassium and renal function.  4 hyperlipidemia-continue statin.  Check lipids and liver.  5 history of transient atrial flutter at time of exercise treadmill-continue beta-blocker.  He has previously declined anticoagulation and understands the higher risk of CVA.  Kirk [facility], MD

## 2018-07-29 DIAGNOSIS — I10 Essential (primary) hypertension: Secondary | ICD-10-CM | POA: Diagnosis not present

## 2018-07-29 DIAGNOSIS — M5136 Other intervertebral disc degeneration, lumbar region: Secondary | ICD-10-CM | POA: Diagnosis not present

## 2018-07-29 DIAGNOSIS — M545 Low back pain: Secondary | ICD-10-CM | POA: Diagnosis not present

## 2018-07-29 DIAGNOSIS — Z6841 Body Mass Index (BMI) 40.0 and over, adult: Secondary | ICD-10-CM | POA: Diagnosis not present

## 2018-08-04 ENCOUNTER — Ambulatory Visit (INDEPENDENT_AMBULATORY_CARE_PROVIDER_SITE_OTHER): Payer: Medicare PPO | Admitting: Cardiology

## 2018-08-04 ENCOUNTER — Encounter: Payer: Self-pay | Admitting: Cardiology

## 2018-08-04 VITALS — BP 126/80 | HR 74 | Ht 73.0 in | Wt 302.0 lb

## 2018-08-04 DIAGNOSIS — I2510 Atherosclerotic heart disease of native coronary artery without angina pectoris: Secondary | ICD-10-CM

## 2018-08-04 DIAGNOSIS — E785 Hyperlipidemia, unspecified: Secondary | ICD-10-CM | POA: Diagnosis not present

## 2018-08-04 DIAGNOSIS — I10 Essential (primary) hypertension: Secondary | ICD-10-CM | POA: Diagnosis not present

## 2018-08-04 LAB — LIPID PANEL
CHOLESTEROL TOTAL: 141 mg/dL (ref 100–199)
Chol/HDL Ratio: 2.7 ratio (ref 0.0–5.0)
HDL: 53 mg/dL (ref >39)
LDL Calculated: 78 mg/dL (ref 0–99)
Triglycerides: 50 mg/dL (ref 0–149)
VLDL Cholesterol Cal: 10 mg/dL (ref 5–40)

## 2018-08-04 LAB — COMPREHENSIVE METABOLIC PANEL
ALK PHOS: 114 IU/L (ref 39–117)
ALT: 23 IU/L (ref 0–44)
AST: 20 IU/L (ref 0–40)
Albumin/Globulin Ratio: 1.6 (ref 1.2–2.2)
Albumin: 3.9 g/dL (ref 3.7–4.7)
BUN/Creatinine Ratio: 7 — ABNORMAL LOW (ref 10–24)
BUN: 9 mg/dL (ref 8–27)
Bilirubin Total: 0.5 mg/dL (ref 0.0–1.2)
CALCIUM: 9.2 mg/dL (ref 8.6–10.2)
CO2: 22 mmol/L (ref 20–29)
CREATININE: 1.24 mg/dL (ref 0.76–1.27)
Chloride: 105 mmol/L (ref 96–106)
GFR calc Af Amer: 65 mL/min/{1.73_m2} (ref >59)
GFR, EST NON AFRICAN AMERICAN: 56 mL/min/{1.73_m2} — AB (ref >59)
GLUCOSE: 96 mg/dL (ref 65–99)
Globulin, Total: 2.5 g/dL (ref 1.5–4.5)
Potassium: 4.5 mmol/L (ref 3.5–5.2)
Sodium: 140 mmol/L (ref 134–144)
Total Protein: 6.4 g/dL (ref 6.0–8.5)

## 2018-08-04 NOTE — Patient Instructions (Signed)
Medication Instructions:  NO CHANGE If you need a refill on your cardiac medications before your next appointment, please call your pharmacy.   Lab work: Your physician recommends that you HAVE LAB WORK TODAY If you have labs (blood work) drawn today and your tests are completely normal, you will receive your results only by: . MyChart Message (if you have MyChart) OR . A paper copy in the mail If you have any lab test that is abnormal or we need to change your treatment, we will call you to review the results.  Follow-Up: At [facility], you and your health needs are our priority.  As part of our continuing mission to provide you with exceptional heart care, we have created designated Provider Care Teams.  These Care Teams include your primary Cardiologist (physician) and Advanced Practice Providers (APPs -  Physician Assistants and Nurse Practitioners) who all work together to provide you with the care you need, when you need it. You will need a follow up appointment in 6 months.  Please call our office 2 months in advance to schedule this appointment.  You may see Brian Crenshaw, MD or one of the following Advanced Practice Providers on your designated Care Team:   Luke Kilroy, PA-C Krista Kroeger, PA-C . Callie Goodrich, PA-C CALL IN June TO SCHEDULE APPOINTMENT IN AUGUST    

## 2018-08-06 ENCOUNTER — Telehealth: Payer: Self-pay | Admitting: *Deleted

## 2018-08-06 DIAGNOSIS — E785 Hyperlipidemia, unspecified: Secondary | ICD-10-CM

## 2018-08-06 MED ORDER — EZETIMIBE 10 MG PO TABS: 10.0000 mg | ORAL_TABLET | Freq: Every day | ORAL | 3 refills | Status: DC

## 2018-08-06 NOTE — Telephone Encounter (Addendum)
pt aware of results, New script sent to the pharmacy and Lab orders mailed to the pt   ---- Message from Lelon Perla, MD sent at 08/05/2018  7:20 AM EST ----- Add zetia 10 mg daily; lipids and liver 3 months Kirk [facility]s

## 2018-08-11 DIAGNOSIS — M5416 Radiculopathy, lumbar region: Secondary | ICD-10-CM | POA: Diagnosis not present

## 2018-08-13 DIAGNOSIS — I2510 Atherosclerotic heart disease of native coronary artery without angina pectoris: Secondary | ICD-10-CM | POA: Diagnosis not present

## 2018-08-13 DIAGNOSIS — M545 Low back pain: Secondary | ICD-10-CM | POA: Diagnosis not present

## 2018-08-13 DIAGNOSIS — I10 Essential (primary) hypertension: Secondary | ICD-10-CM | POA: Diagnosis not present

## 2018-09-10 DIAGNOSIS — R310 Gross hematuria: Secondary | ICD-10-CM | POA: Diagnosis not present

## 2018-09-10 DIAGNOSIS — N400 Enlarged prostate without lower urinary tract symptoms: Secondary | ICD-10-CM | POA: Diagnosis not present

## 2018-09-15 DIAGNOSIS — M5416 Radiculopathy, lumbar region: Secondary | ICD-10-CM | POA: Diagnosis not present

## 2018-09-15 DIAGNOSIS — I10 Essential (primary) hypertension: Secondary | ICD-10-CM | POA: Diagnosis not present

## 2018-09-15 DIAGNOSIS — Z6841 Body Mass Index (BMI) 40.0 and over, adult: Secondary | ICD-10-CM | POA: Diagnosis not present

## 2018-09-15 DIAGNOSIS — M5136 Other intervertebral disc degeneration, lumbar region: Secondary | ICD-10-CM | POA: Diagnosis not present

## 2018-10-09 ENCOUNTER — Other Ambulatory Visit: Payer: Self-pay | Admitting: Cardiology

## 2018-10-09 NOTE — Telephone Encounter (Signed)
Irbesartan and metoprolol refilled.

## 2018-10-30 DIAGNOSIS — M5416 Radiculopathy, lumbar region: Secondary | ICD-10-CM | POA: Diagnosis not present

## 2018-10-30 DIAGNOSIS — M5136 Other intervertebral disc degeneration, lumbar region: Secondary | ICD-10-CM | POA: Diagnosis not present

## 2018-11-11 DIAGNOSIS — M545 Low back pain: Secondary | ICD-10-CM | POA: Diagnosis not present

## 2018-11-11 DIAGNOSIS — I10 Essential (primary) hypertension: Secondary | ICD-10-CM | POA: Diagnosis not present

## 2018-11-11 DIAGNOSIS — N401 Enlarged prostate with lower urinary tract symptoms: Secondary | ICD-10-CM | POA: Diagnosis not present

## 2018-11-11 DIAGNOSIS — I2510 Atherosclerotic heart disease of native coronary artery without angina pectoris: Secondary | ICD-10-CM | POA: Diagnosis not present

## 2018-11-13 ENCOUNTER — Other Ambulatory Visit: Payer: Self-pay | Admitting: Cardiology

## 2018-11-13 DIAGNOSIS — I714 Abdominal aortic aneurysm, without rupture, unspecified: Secondary | ICD-10-CM

## 2018-11-20 DIAGNOSIS — M5416 Radiculopathy, lumbar region: Secondary | ICD-10-CM | POA: Diagnosis not present

## 2018-12-20 ENCOUNTER — Other Ambulatory Visit: Payer: Self-pay | Admitting: Cardiology

## 2018-12-23 DIAGNOSIS — I10 Essential (primary) hypertension: Secondary | ICD-10-CM | POA: Diagnosis not present

## 2018-12-23 DIAGNOSIS — Z6841 Body Mass Index (BMI) 40.0 and over, adult: Secondary | ICD-10-CM | POA: Diagnosis not present

## 2018-12-30 DIAGNOSIS — E669 Obesity, unspecified: Secondary | ICD-10-CM | POA: Diagnosis not present

## 2018-12-30 DIAGNOSIS — M545 Low back pain: Secondary | ICD-10-CM | POA: Diagnosis not present

## 2018-12-30 DIAGNOSIS — I10 Essential (primary) hypertension: Secondary | ICD-10-CM | POA: Diagnosis not present

## 2018-12-30 DIAGNOSIS — I2510 Atherosclerotic heart disease of native coronary artery without angina pectoris: Secondary | ICD-10-CM | POA: Diagnosis not present

## 2018-12-31 ENCOUNTER — Other Ambulatory Visit: Payer: Self-pay | Admitting: Neurosurgery

## 2018-12-31 DIAGNOSIS — R29898 Other symptoms and signs involving the musculoskeletal system: Secondary | ICD-10-CM

## 2019-01-01 ENCOUNTER — Telehealth: Payer: Self-pay | Admitting: Nurse Practitioner

## 2019-01-01 ENCOUNTER — Telehealth: Payer: Self-pay | Admitting: *Deleted

## 2019-01-01 NOTE — Telephone Encounter (Signed)
   [facility] Pre-operative Risk Assessment    Request for surgical clearance:  1. What type of surgery is being performed? myelogram   2. When is this surgery scheduled? TBD   3. What type of clearance is required (medical clearance vs. Pharmacy clearance to hold med vs. Both)? both  4. Are there any medications that need to be held prior to surgery and how long?plavix-5 days   5. Practice name and name of physician performing surgery? [facility]  6. What is your office phone number [phone]   7.   What is your office fax number [phone]  8.   Anesthesia type (None, local, MAC, general) ? unknown   Fredia Beets 01/01/2019, 4:07 PM  _________________________________________________________________   (provider comments below)

## 2019-01-01 NOTE — Telephone Encounter (Signed)
Phone call to patient to verify medication list and allergies for myelogram procedure. Pt aware he will need to hold Plavix for 5 days prior to myelogram appointment time (pending approval and recommendations from cardiologist Dr. Kirk [facility]). Pt verbalized understanding. Pre and post procedure instructions reviewed with pt. Faxed thinner hold request to Dr. Stanford Breed, awaiting response.

## 2019-01-01 NOTE — Telephone Encounter (Signed)
Dr Stanford Breed OK to hold aspirin and Plavix for myelogram ?  Kerin [facility]ansom PA-C 01/01/2019 4:51 PM

## 2019-01-02 NOTE — Telephone Encounter (Signed)
Ok to hold asa and plavix for procedure [employer]

## 2019-01-05 NOTE — Telephone Encounter (Signed)
   Primary Cardiologist: Kirk Ruths, MD  Chart reviewed as part of pre-operative protocol coverage. Patient was contacted 01/05/2019 in reference to pre-operative risk assessment for pending surgery as outlined below.  Gregory Wilson was last seen on 08/04/2018 by Dr. Stanford Breed.  Since that day, Gregory Wilson has done well without chest pain or shortness of breath. Stress test in Nov 2019 was normal.   Therefore, based on ACC/AHA guidelines, the patient would be at acceptable risk for the planned procedure without further cardiovascular testing.   I will route this recommendation to the requesting party via Epic fax function and remove from pre-op pool.  Please call with questions. Patient is cleared to hold aspirin and plavix for 5 days prior to the procedure and restart them as soon as possible after the procedure at the discretion of the performing physician.  [place], [place] 01/05/2019, 11:40 AM

## 2019-01-26 ENCOUNTER — Other Ambulatory Visit: Payer: Self-pay

## 2019-01-26 ENCOUNTER — Ambulatory Visit
Admission: RE | Admit: 2019-01-26 | Discharge: 2019-01-26 | Disposition: A | Discharge status: Home / Self Care | Payer: Medicare PPO | Source: Ambulatory Visit | Attending: Neurosurgery | Admitting: Neurosurgery

## 2019-01-26 DIAGNOSIS — R29898 Other symptoms and signs involving the musculoskeletal system: Secondary | ICD-10-CM

## 2019-01-26 DIAGNOSIS — M5126 Other intervertebral disc displacement, lumbar region: Secondary | ICD-10-CM | POA: Diagnosis not present

## 2019-01-26 DIAGNOSIS — M4802 Spinal stenosis, cervical region: Secondary | ICD-10-CM | POA: Diagnosis not present

## 2019-01-26 DIAGNOSIS — M4804 Spinal stenosis, thoracic region: Secondary | ICD-10-CM | POA: Diagnosis not present

## 2019-01-26 MED ORDER — DIAZEPAM 5 MG PO TABS
5.0000 mg | ORAL_TABLET | Freq: Once | ORAL | Status: AC
  Administered 2019-01-26: 5 mg via ORAL

## 2019-01-26 MED ORDER — IOPAMIDOL (ISOVUE-M 300) INJECTION 61%
10.0000 mL | Freq: Once | INTRAMUSCULAR | Status: AC | PRN
  Administered 2019-01-26: 14:00:00 10 mL via INTRATHECAL

## 2019-01-26 NOTE — Progress Notes (Signed)
Patient states he has been off Plavix for at least the past five days.

## 2019-01-26 NOTE — Discharge Instructions (Signed)

## 2019-02-09 DIAGNOSIS — Z6841 Body Mass Index (BMI) 40.0 and over, adult: Secondary | ICD-10-CM | POA: Diagnosis not present

## 2019-02-09 DIAGNOSIS — I10 Essential (primary) hypertension: Secondary | ICD-10-CM | POA: Diagnosis not present

## 2019-02-09 DIAGNOSIS — M5136 Other intervertebral disc degeneration, lumbar region: Secondary | ICD-10-CM | POA: Diagnosis not present

## 2019-02-11 DIAGNOSIS — I10 Essential (primary) hypertension: Secondary | ICD-10-CM | POA: Diagnosis not present

## 2019-02-11 DIAGNOSIS — I2510 Atherosclerotic heart disease of native coronary artery without angina pectoris: Secondary | ICD-10-CM | POA: Diagnosis not present

## 2019-02-11 DIAGNOSIS — N401 Enlarged prostate with lower urinary tract symptoms: Secondary | ICD-10-CM | POA: Diagnosis not present

## 2019-02-11 DIAGNOSIS — M545 Low back pain: Secondary | ICD-10-CM | POA: Diagnosis not present

## 2019-03-12 DIAGNOSIS — R310 Gross hematuria: Secondary | ICD-10-CM | POA: Diagnosis not present

## 2019-03-12 DIAGNOSIS — R609 Edema, unspecified: Secondary | ICD-10-CM | POA: Diagnosis not present

## 2019-03-16 DIAGNOSIS — I10 Essential (primary) hypertension: Secondary | ICD-10-CM | POA: Diagnosis not present

## 2019-03-16 DIAGNOSIS — M159 Polyosteoarthritis, unspecified: Secondary | ICD-10-CM | POA: Diagnosis not present

## 2019-03-16 DIAGNOSIS — I2510 Atherosclerotic heart disease of native coronary artery without angina pectoris: Secondary | ICD-10-CM | POA: Diagnosis not present

## 2019-03-16 DIAGNOSIS — R600 Localized edema: Secondary | ICD-10-CM | POA: Diagnosis not present

## 2019-03-17 ENCOUNTER — Other Ambulatory Visit (HOSPITAL_COMMUNITY): Payer: Self-pay | Admitting: Pulmonary Disease

## 2019-03-17 ENCOUNTER — Other Ambulatory Visit: Payer: Self-pay | Admitting: Pulmonary Disease

## 2019-03-17 DIAGNOSIS — R2243 Localized swelling, mass and lump, lower limb, bilateral: Secondary | ICD-10-CM

## 2019-03-20 ENCOUNTER — Ambulatory Visit (HOSPITAL_COMMUNITY)
Admission: RE | Admit: 2019-03-20 | Discharge: 2019-03-20 | Disposition: A | Discharge status: Home / Self Care | Payer: Medicare PPO | Source: Ambulatory Visit | Attending: Pulmonary Disease | Admitting: Pulmonary Disease

## 2019-03-20 ENCOUNTER — Other Ambulatory Visit: Payer: Self-pay

## 2019-03-20 DIAGNOSIS — R2243 Localized swelling, mass and lump, lower limb, bilateral: Secondary | ICD-10-CM | POA: Diagnosis not present

## 2019-03-20 DIAGNOSIS — M7989 Other specified soft tissue disorders: Secondary | ICD-10-CM | POA: Diagnosis not present

## 2019-03-25 ENCOUNTER — Other Ambulatory Visit: Payer: Self-pay | Admitting: Cardiology

## 2019-03-25 ENCOUNTER — Telehealth: Payer: Self-pay | Admitting: Cardiology

## 2019-03-25 NOTE — Telephone Encounter (Signed)
Placed note in appointment desk- given okay for patient wife to come to appointment.  Thanks!

## 2019-03-25 NOTE — Telephone Encounter (Signed)
  Patient called to say he will need his wife to come to his appt with him to help him get up and down.

## 2019-03-25 NOTE — Telephone Encounter (Signed)
Okay for wife to accompany Kirk Ruths

## 2019-03-25 NOTE — Telephone Encounter (Signed)
Please advise if okay for patient wife to come to appointment

## 2019-03-26 NOTE — Progress Notes (Signed)
HPI: FU CAD s/p DES to LAD and BMS to Dx in 2003,PTCA LAD 9/18,HL, HTN, AFlutter (transient on treadmill - declined coumadin). Since last seen,  and dyspnea.  No chest pain, palpitations or syncope.  He does have recent mild pedal edema.  Venous Dopplers were negative.  Studies: - LHC (10/2001): Mid LAD 70%, Ostial D1 80%, CFX and RCA normal, EF 65%. PCI: Cypher DES to LAD and Express BMS to Dx. -LHC September 2018 showed anterior infarct secondary to partially occlusive thrombus in prior LAD stent successfully treated with heparin, Aggrastat and balloon angioplasty. No other obstructive disease noted. Antiplatelet therapy recommended long-term. - Echocardiogram September 2018 showed normal LV function. - Nuclear (11/19): No ischemia, EF 69%,  -Abdominal ultrasound(7/19): Abdominal aortic ectasia measuring 2.8 cm decreased from 3.3 cm previously.  Right common iliac 1.5 cm.  Follow-up recommended 24 months.  - Lower extremity venous Dopplers September 2020-.  Current Outpatient Medications  Medication Sig Dispense Refill  . amLODipine (NORVASC) 10 MG tablet TAKE 1 TABLET (10 MG TOTAL) BY MOUTH EVERY MORNING. KEEP OFFICE VISIT 90 tablet 1  . aspirin EC 81 MG EC tablet Take 1 tablet (81 mg total) by mouth daily.    Marland Kitchen atorvastatin (LIPITOR) 80 MG tablet TAKE 1 TABLET EVERY DAY  AT  6  PM (KEEP MD APPOINTMENT) 90 tablet 1  . benzonatate (TESSALON) 100 MG capsule Take 1 capsule (100 mg total) by mouth every 8 (eight) hours. 21 capsule 0  . clopidogrel (PLAVIX) 75 MG tablet TAKE 1 TABLET (75 MG TOTAL) BY MOUTH DAILY. 90 tablet 3  . esomeprazole (NEXIUM) 40 MG capsule Take 40 mg by mouth daily as needed.     . irbesartan (AVAPRO) 300 MG tablet TAKE 1 TABLET EVERY DAY (KEEP MD APT) 90 tablet 1  . metoprolol succinate (TOPROL-XL) 100 MG 24 hr tablet TAKE 1 TABLET EVERY DAY  (KEEP  OFFICE  VISIT) 90 tablet 1  . nitroGLYCERIN (NITROSTAT) 0.4 MG SL tablet Place 1 tablet (0.4 mg  total) under the tongue every 5 (five) minutes x 3 doses as needed for chest pain. 25 tablet 2  . ezetimibe (ZETIA) 10 MG tablet Take 1 tablet (10 mg total) by mouth daily. 90 tablet 3   No current facility-administered medications for this visit.      Past Medical History:  Diagnosis Date  . Acute ST elevation myocardial infarction (STEMI) of anterior wall ([place]) 03/07/2017  . Arthritis   . BPH (benign prostatic hypertrophy)   . CAD (coronary artery disease)    a. Lexiscan Myoview (11/2013):  Normal stress nuclear study.  LV Ejection Fraction: 65%  . Complication of anesthesia    hx PONV  . Degenerative joint disease   . GERD (gastroesophageal reflux disease)   . H/O hiatal hernia   . History of atrial flutter    REMOTE HX ON TREADMILL  . History of colon polyps   . Hyperlipidemia   . Hypertension   . IBS (irritable bowel syndrome)   . Lower urinary tract symptoms (LUTS)   . OSA (obstructive sleep apnea) 03/26/2016  . RBBB (right bundle branch block)   . S/P drug eluting coronary stent placement    11-26-2001   X2 DES  TO LAD &  BMS  . Schatzki's ring   . Status post dilation of esophageal narrowing     Past Surgical History:  Procedure Laterality Date  . APPENDECTOMY    . CARDIOVASCULAR STRESS TEST  12-09-2013  NUCLEAR LEXISCAN STUDY--  RESULTS PENDING  . CARDIOVASCULAR STRESS TEST  04/2011  DR Deshone Lyssy   NORMAL /  NO ISCHEMIA/  EF 67%  . CHOLECYSTECTOMY    . COLONOSCOPY  05/29/2011   RMR: tubular adenoma  . CORONARY ANGIOPLASTY WITH STENT PLACEMENT  11-26-2001  DR Eustace Quail   DES (Cypher) to LAD 70%/  BMS (Express) to OSTIAL D1 80%/   CFX  &  RCA normal/  EF 65%  . CORONARY/GRAFT ACUTE MI REVASCULARIZATION N/A 03/07/2017   Procedure: Coronary/Graft Acute MI Revascularization;  Surgeon: Sherren Mocha, MD;  Location: [facility];  Service: Cardiovascular;  Laterality: N/A;  . ESOPHAGOGASTRODUODENOSCOPY  04/05/2004   RMR: Small hiatal hernia/Questionable subtle  Schatzki's ring, status post dilation as described  . ESOPHAGOGASTRODUODENOSCOPY  05/2011   RMR: small hiatal hernia, empiric dilation with 33F and 48F Maloney  . ESOPHAGOGASTRODUODENOSCOPY (EGD) WITH ESOPHAGEAL DILATION N/A 01/16/2013   Dr. Rourk:baggy somewhat atonic esophagus-query underlying esophageal motility disorder. Status post passage of a Maloney dilator (large bore) empirically. Status post esophageal biopsy/Reflux symptoms are well controlled on Nexium.benign path  . LEFT HEART CATH AND CORONARY ANGIOGRAPHY N/A 03/07/2017   Procedure: LEFT HEART CATH AND CORONARY ANGIOGRAPHY;  Surgeon: Sherren Mocha, MD;  Location: [facility];  Service: Cardiovascular;  Laterality: N/A;  . TOTAL HIP ARTHROPLASTY Left 09-07-2003  . TOTAL KNEE ARTHROPLASTY Left 07-27-2002  . TRANSURETHRAL RESECTION OF PROSTATE N/A 12/21/2013   Procedure: TRANSURETHRAL RESECTION OF THE PROSTATE WITH GYRUS INSTRUMENTS;  Surgeon: Jorja Loa, MD;  Location: [facility];  Service: Urology;  Laterality: N/A;    Social History   Socioeconomic History  . Marital status: Married    Spouse name: Not on file  . Number of children: Not on file  . Years of education: Not on file  . Highest education level: Not on file  Occupational History  . Occupation: retired  Scientific laboratory technician  . Financial resource strain: Not on file  . Food insecurity    Worry: Not on file    Inability: Not on file  . Transportation needs    Medical: Not on file    Non-medical: Not on file  Tobacco Use  . Smoking status: Never Smoker  . Smokeless tobacco: Never Used  Substance and Sexual Activity  . Alcohol use: Yes    Comment: on holidays  . Drug use: No  . Sexual activity: Never  Lifestyle  . Physical activity    Days per week: Not on file    Minutes per session: Not on file  . Stress: Not on file  Relationships  . Social Herbalist on phone: Not on file    Gets together: Not on file    Attends  religious service: Not on file    Active member of club or organization: Not on file    Attends meetings of clubs or organizations: Not on file    Relationship status: Not on file  . Intimate partner violence    Fear of current or ex partner: Not on file    Emotionally abused: Not on file    Physically abused: Not on file    Forced sexual activity: Not on file  Other Topics Concern  . Not on file  Social History Narrative   Retired from [employer]    Family History  Problem Relation Age of Onset  . Cancer Father        Lung  .  Hypertension Mother   . Coronary artery disease Mother   . Kidney failure Mother   . Cancer Sister        breast carcinoma  . Hypertension Sister   . Colon cancer Neg Hx     ROS: no fevers or chills, productive cough, hemoptysis, dysphasia, odynophagia, melena, hematochezia, dysuria, hematuria, rash, seizure activity, orthopnea, PND, claudication. Remaining systems are negative.  Physical Exam: Well-developed well-nourished in no acute distress.  Skin is warm and dry.  HEENT is normal.  Neck is supple.  Chest is clear to auscultation with normal expansion.  Cardiovascular exam is regular rate and rhythm.  Abdominal exam nontender or distended. No masses palpated. Extremities show trace edema. neuro grossly intact  ECG-sinus bradycardia at a rate of 58, right bundle branch block.  Personally reviewed  A/P  1 coronary artery disease-patient denies any recurrent chest pain.  Continue aspirin and statin.  Discontinue Plavix.  2 abdominal aortic aneurysm-we will arrange follow-up ultrasound July 2021.  3 hypertension-blood pressure controlled.  Continue present medications and follow.  4 hyperlipidemia-continue statin and zetia.  5 history of transient atrial flutter at time of prior exercise treadmill-continue beta-blocker.  He declines anticoagulation and understands the higher risk of CVA.  6 recent pedal edema-lower extremity  venous Dopplers were negative.  Likely venous insufficiency.  Echocardiogram is pending and we will review.  Kirk Ruths, MD

## 2019-03-30 ENCOUNTER — Other Ambulatory Visit: Payer: Self-pay

## 2019-03-30 ENCOUNTER — Encounter: Payer: Self-pay | Admitting: Cardiology

## 2019-03-30 ENCOUNTER — Ambulatory Visit (INDEPENDENT_AMBULATORY_CARE_PROVIDER_SITE_OTHER): Payer: Medicare PPO | Admitting: Cardiology

## 2019-03-30 VITALS — BP 112/76 | HR 58 | Temp 97.7°F | Ht 73.0 in | Wt 318.0 lb

## 2019-03-30 DIAGNOSIS — I10 Essential (primary) hypertension: Secondary | ICD-10-CM | POA: Diagnosis not present

## 2019-03-30 DIAGNOSIS — I2510 Atherosclerotic heart disease of native coronary artery without angina pectoris: Secondary | ICD-10-CM | POA: Diagnosis not present

## 2019-03-30 DIAGNOSIS — E785 Hyperlipidemia, unspecified: Secondary | ICD-10-CM

## 2019-03-30 DIAGNOSIS — I483 Typical atrial flutter: Secondary | ICD-10-CM | POA: Diagnosis not present

## 2019-03-30 NOTE — Patient Instructions (Signed)
Medication Instructions:  STOP PLAVIX If you need a refill on your cardiac medications before your next appointment, please call your pharmacy.   Lab work: If you have labs (blood work) drawn today and your tests are completely normal, you will receive your results only by: . MyChart Message (if you have MyChart) OR . A paper copy in the mail If you have any lab test that is abnormal or we need to change your treatment, we will call you to review the results.  Follow-Up: At [facility], you and your health needs are our priority.  As part of our continuing mission to provide you with exceptional heart care, we have created designated Provider Care Teams.  These Care Teams include your primary Cardiologist (physician) and Advanced Practice Providers (APPs -  Physician Assistants and Nurse Practitioners) who all work together to provide you with the care you need, when you need it. You will need a follow up appointment in 6 months.  Please call our office 2 months in advance to schedule this appointment.  You may see Brian Crenshaw, MD or one of the following Advanced Practice Providers on your designated Care Team:   Luke Kilroy, PA-C Krista Kroeger, PA-C . Callie Goodrich, PA-C     

## 2019-04-03 ENCOUNTER — Other Ambulatory Visit (HOSPITAL_COMMUNITY): Payer: Self-pay | Admitting: Pulmonary Disease

## 2019-04-03 ENCOUNTER — Other Ambulatory Visit (HOSPITAL_COMMUNITY): Payer: Medicare PPO

## 2019-04-03 DIAGNOSIS — R609 Edema, unspecified: Secondary | ICD-10-CM

## 2019-04-07 ENCOUNTER — Other Ambulatory Visit: Payer: Self-pay

## 2019-04-07 ENCOUNTER — Ambulatory Visit (HOSPITAL_COMMUNITY)
Admission: RE | Admit: 2019-04-07 | Discharge: 2019-04-07 | Disposition: A | Discharge status: Home / Self Care | Payer: Medicare PPO | Source: Ambulatory Visit | Attending: Hematology | Admitting: Hematology

## 2019-04-07 DIAGNOSIS — E785 Hyperlipidemia, unspecified: Secondary | ICD-10-CM | POA: Insufficient documentation

## 2019-04-07 DIAGNOSIS — I119 Hypertensive heart disease without heart failure: Secondary | ICD-10-CM | POA: Diagnosis not present

## 2019-04-07 DIAGNOSIS — G4733 Obstructive sleep apnea (adult) (pediatric): Secondary | ICD-10-CM | POA: Diagnosis not present

## 2019-04-07 DIAGNOSIS — R609 Edema, unspecified: Secondary | ICD-10-CM | POA: Diagnosis not present

## 2019-04-07 DIAGNOSIS — I714 Abdominal aortic aneurysm, without rupture: Secondary | ICD-10-CM | POA: Diagnosis not present

## 2019-04-07 DIAGNOSIS — E669 Obesity, unspecified: Secondary | ICD-10-CM | POA: Insufficient documentation

## 2019-04-07 DIAGNOSIS — I252 Old myocardial infarction: Secondary | ICD-10-CM | POA: Diagnosis not present

## 2019-04-07 NOTE — Progress Notes (Signed)
*  PRELIMINARY RESULTS* Echocardiogram 2D Echocardiogram has been performed.  Gregory Wilson 04/07/2019, 3:56 PM

## 2019-06-11 DIAGNOSIS — I2510 Atherosclerotic heart disease of native coronary artery without angina pectoris: Secondary | ICD-10-CM | POA: Diagnosis not present

## 2019-06-11 DIAGNOSIS — I10 Essential (primary) hypertension: Secondary | ICD-10-CM | POA: Diagnosis not present

## 2019-06-11 DIAGNOSIS — M545 Low back pain: Secondary | ICD-10-CM | POA: Diagnosis not present

## 2019-06-11 DIAGNOSIS — G4700 Insomnia, unspecified: Secondary | ICD-10-CM | POA: Diagnosis not present

## 2019-07-24 DIAGNOSIS — R609 Edema, unspecified: Secondary | ICD-10-CM

## 2019-07-24 MED ORDER — FUROSEMIDE 20 MG PO TABS: 20.0000 mg | ORAL_TABLET | Freq: Every day | ORAL | 3 refills | Status: DC

## 2019-07-24 NOTE — Telephone Encounter (Signed)
Lasix 20 mg daily. Check potassium and renal function in 1 week.  Kirk Ruths   Message text

## 2019-08-15 LAB — BASIC METABOLIC PANEL
BUN/Creatinine Ratio: 11 (ref 10–24)
BUN: 13 mg/dL (ref 8–27)
CO2: 21 mmol/L (ref 20–29)
Calcium: 9.2 mg/dL (ref 8.6–10.2)
Chloride: 106 mmol/L (ref 96–106)
Creatinine, Ser: 1.14 mg/dL (ref 0.76–1.27)
GFR calc Af Amer: 71 mL/min/{1.73_m2} (ref >59)
GFR calc non Af Amer: 62 mL/min/{1.73_m2} (ref >59)
Glucose: 92 mg/dL (ref 65–99)
Potassium: 4.5 mmol/L (ref 3.5–5.2)
Sodium: 142 mmol/L (ref 134–144)

## 2019-09-03 NOTE — Progress Notes (Signed)
HPI: FU CAD s/p DES to LAD and BMS to Dx in 2003,PTCA LAD 9/18,HL, HTN, AFlutter (transient on treadmill - declined coumadin).Since last seen,patient denies dyspnea, chest pain, palpitations, syncope.  He has chronic mild pedal edema which is unchanged.  Studies: - LHC (10/2001): Mid LAD 70%, Ostial D1 80%, CFX and RCA normal, EF 65%. PCI: Cypher DES to LAD and Express BMS to Dx. -LHC September 2018 showed anterior infarct secondary to partially occlusive thrombus in prior LAD stent successfully treated with heparin, Aggrastat and balloon angioplasty. No other obstructive disease noted. Antiplatelet therapy recommended long-term. - Echocardiogram October 2020 showed normal LV systolic function, mild left ventricular hypertrophy, mild aortic insufficiency. - Nuclear (11/19): No ischemia, EF 69%,  -Abdominal ultrasound(7/19):Abdominal aortic ectasia measuring 2.8 cm decreased from 3.3 cm previously. Right common iliac 1.5 cm. Follow-up recommended 24 months.  - Lower extremity venous Dopplers September 2020 negative.  Current Outpatient Medications  Medication Sig Dispense Refill  . amLODipine (NORVASC) 10 MG tablet TAKE 1 TABLET (10 MG TOTAL) BY MOUTH EVERY MORNING. KEEP OFFICE VISIT 90 tablet 1  . aspirin EC 81 MG EC tablet Take 1 tablet (81 mg total) by mouth daily.    Marland Kitchen atorvastatin (LIPITOR) 80 MG tablet TAKE 1 TABLET EVERY DAY  AT  6  PM (KEEP MD APPOINTMENT) 90 tablet 1  . benzonatate (TESSALON) 100 MG capsule Take 1 capsule (100 mg total) by mouth every 8 (eight) hours. 21 capsule 0  . esomeprazole (NEXIUM) 40 MG capsule Take 40 mg by mouth daily as needed.     . fluticasone (FLONASE) 50 MCG/ACT nasal spray     . furosemide (LASIX) 20 MG tablet Take 1 tablet (20 mg total) by mouth daily. 90 tablet 3  . ibuprofen (ADVIL) 800 MG tablet     . irbesartan (AVAPRO) 300 MG tablet TAKE 1 TABLET EVERY DAY (KEEP MD APT) 90 tablet 1  . LINZESS 145 MCG CAPS capsule      . metoprolol succinate (TOPROL-XL) 100 MG 24 hr tablet TAKE 1 TABLET EVERY DAY  (KEEP  OFFICE  VISIT) 90 tablet 1  . nitroGLYCERIN (NITROSTAT) 0.4 MG SL tablet Place 1 tablet (0.4 mg total) under the tongue every 5 (five) minutes x 3 doses as needed for chest pain. 25 tablet 2  . traZODone (DESYREL) 50 MG tablet     . ezetimibe (ZETIA) 10 MG tablet Take 1 tablet (10 mg total) by mouth daily. 90 tablet 3   No current facility-administered medications for this visit.     Past Medical History:  Diagnosis Date  . Acute ST elevation myocardial infarction (STEMI) of anterior wall ([place]) 03/07/2017  . Arthritis   . BPH (benign prostatic hypertrophy)   . CAD (coronary artery disease)    a. Lexiscan Myoview (11/2013):  Normal stress nuclear study.  LV Ejection Fraction: 65%  . Complication of anesthesia    hx PONV  . Degenerative joint disease   . GERD (gastroesophageal reflux disease)   . H/O hiatal hernia   . History of atrial flutter    REMOTE HX ON TREADMILL  . History of colon polyps   . Hyperlipidemia   . Hypertension   . IBS (irritable bowel syndrome)   . Lower urinary tract symptoms (LUTS)   . OSA (obstructive sleep apnea) 03/26/2016  . RBBB (right bundle branch block)   . S/P drug eluting coronary stent placement    11-26-2001   X2 DES  TO LAD &  BMS  . Schatzki's ring   . Status post dilation of esophageal narrowing     Past Surgical History:  Procedure Laterality Date  . APPENDECTOMY    . CARDIOVASCULAR STRESS TEST  12-09-2013   NUCLEAR LEXISCAN STUDY--  RESULTS PENDING  . CARDIOVASCULAR STRESS TEST  04/2011  DR CRENSHAW   NORMAL /  NO ISCHEMIA/  EF 67%  . CHOLECYSTECTOMY    . COLONOSCOPY  05/29/2011   RMR: tubular adenoma  . CORONARY ANGIOPLASTY WITH STENT PLACEMENT  11-26-2001  DR Eustace Quail   DES (Cypher) to LAD 70%/  BMS (Express) to OSTIAL D1 80%/   CFX  &  RCA normal/  EF 65%  . CORONARY/GRAFT ACUTE MI REVASCULARIZATION N/A 03/07/2017   Procedure: Coronary/Graft  Acute MI Revascularization;  Surgeon: Sherren Mocha, MD;  Location: [facility];  Service: Cardiovascular;  Laterality: N/A;  . ESOPHAGOGASTRODUODENOSCOPY  04/05/2004   RMR: Small hiatal hernia/Questionable subtle Schatzki's ring, status post dilation as described  . ESOPHAGOGASTRODUODENOSCOPY  05/2011   RMR: small hiatal hernia, empiric dilation with 5F and 97F Maloney  . ESOPHAGOGASTRODUODENOSCOPY (EGD) WITH ESOPHAGEAL DILATION N/A 01/16/2013   Dr. Rourk:baggy somewhat atonic esophagus-query underlying esophageal motility disorder. Status post passage of a Maloney dilator (large bore) empirically. Status post esophageal biopsy/Reflux symptoms are well controlled on Nexium.benign path  . LEFT HEART CATH AND CORONARY ANGIOGRAPHY N/A 03/07/2017   Procedure: LEFT HEART CATH AND CORONARY ANGIOGRAPHY;  Surgeon: Sherren Mocha, MD;  Location: [facility];  Service: Cardiovascular;  Laterality: N/A;  . TOTAL HIP ARTHROPLASTY Left 09-07-2003  . TOTAL KNEE ARTHROPLASTY Left 07-27-2002  . TRANSURETHRAL RESECTION OF PROSTATE N/A 12/21/2013   Procedure: TRANSURETHRAL RESECTION OF THE PROSTATE WITH GYRUS INSTRUMENTS;  Surgeon: Jorja Loa, MD;  Location: [facility];  Service: Urology;  Laterality: N/A;    Social History   Socioeconomic History  . Marital status: Married    Spouse name: Not on file  . Number of children: Not on file  . Years of education: Not on file  . Highest education level: Not on file  Occupational History  . Occupation: retired  Tobacco Use  . Smoking status: Never Smoker  . Smokeless tobacco: Never Used  Substance and Sexual Activity  . Alcohol use: Yes    Comment: on holidays  . Drug use: No  . Sexual activity: Never  Other Topics Concern  . Not on file  Social History Narrative   Retired from [employer] Strain:   . Difficulty of Paying Living Expenses:   Food  Insecurity:   . Worried About Charity fundraiser in the Last Year:   . Arboriculturist in the Last Year:   Transportation Needs:   . Film/video editor (Medical):   Marland Kitchen Lack of Transportation (Non-Medical):   Physical Activity:   . Days of Exercise per Week:   . Minutes of Exercise per Session:   Stress:   . Feeling of Stress :   Social Connections:   . Frequency of Communication with Friends and Family:   . Frequency of Social Gatherings with Friends and Family:   . Attends Religious Services:   . Active Member of Clubs or Organizations:   . Attends Archivist Meetings:   Marland Kitchen Marital Status:   Intimate Partner Violence:   . Fear of Current or Ex-Partner:   . Emotionally Abused:   .  Physically Abused:   . Sexually Abused:     Family History  Problem Relation Age of Onset  . Cancer Father        Lung  . Hypertension Mother   . Coronary artery disease Mother   . Kidney failure Mother   . Cancer Sister        breast carcinoma  . Hypertension Sister   . Colon cancer Neg Hx     ROS: Arthralgias but no fevers or chills, productive cough, hemoptysis, dysphasia, odynophagia, melena, hematochezia, dysuria, hematuria, rash, seizure activity, orthopnea, PND, pedal edema, claudication. Remaining systems are negative.  Physical Exam: Well-developed well-nourished in no acute distress.  Skin is warm and dry.  HEENT is normal.  Neck is supple.  Chest is clear to auscultation with normal expansion.  Cardiovascular exam is regular rate and rhythm.  Abdominal exam nontender or distended. No masses palpated. Extremities show 1+ edema. neuro grossly intact   A/P  1 coronary artery disease-no chest pain.  Continue aspirin and statin.  2 abdominal aortic aneurysm-plan follow-up ultrasound July 2021.  3 hypertension-patient's blood pressure is controlled.  Continue present medical regimen.  Check potassium and renal function.  4 hyperlipidemia-continue present  medications.  Check lipids and liver.  5 lower extremity edema-this is felt secondary to venous insufficiency.  Echocardiogram shows preserved LV function.  Venous Dopplers previously negative.  6 history of transient atrial flutter at time of prior exercise treadmill-continue beta-blocker.  Patient has declined anticoagulation previously.  Kirk Ruths, MD

## 2019-09-10 ENCOUNTER — Ambulatory Visit (INDEPENDENT_AMBULATORY_CARE_PROVIDER_SITE_OTHER): Payer: Medicare PPO | Admitting: Cardiology

## 2019-09-10 ENCOUNTER — Other Ambulatory Visit: Payer: Self-pay

## 2019-09-10 ENCOUNTER — Encounter: Payer: Self-pay | Admitting: Cardiology

## 2019-09-10 VITALS — BP 128/80 | HR 68 | Temp 98.1°F | Ht 74.0 in | Wt 317.0 lb

## 2019-09-10 DIAGNOSIS — R609 Edema, unspecified: Secondary | ICD-10-CM

## 2019-09-10 DIAGNOSIS — I2510 Atherosclerotic heart disease of native coronary artery without angina pectoris: Secondary | ICD-10-CM | POA: Diagnosis not present

## 2019-09-10 DIAGNOSIS — I10 Essential (primary) hypertension: Secondary | ICD-10-CM | POA: Diagnosis not present

## 2019-09-10 NOTE — Patient Instructions (Signed)
Medication Instructions:  NO CHANGE *If you need a refill on your cardiac medications before your next appointment, please call your pharmacy*   Lab Work: Your physician recommends that you HAVE LAB WORK TODAY If you have labs (blood work) drawn today and your tests are completely normal, you will receive your results only by: . MyChart Message (if you have MyChart) OR . A paper copy in the mail If you have any lab test that is abnormal or we need to change your treatment, we will call you to review the results.   Follow-Up: At [facility], you and your health needs are our priority.  As part of our continuing mission to provide you with exceptional heart care, we have created designated Provider Care Teams.  These Care Teams include your primary Cardiologist (physician) and Advanced Practice Providers (APPs -  Physician Assistants and Nurse Practitioners) who all work together to provide you with the care you need, when you need it.  We recommend signing up for the patient portal called "MyChart".  Sign up information is provided on this After Visit Summary.  MyChart is used to connect with patients for Virtual Visits (Telemedicine).  Patients are able to view lab/test results, encounter notes, upcoming appointments, etc.  Non-urgent messages can be sent to your provider as well.   To learn more about what you can do with MyChart, go to [email or web address].    Your next appointment:   6 month(s)  The format for your next appointment:   Either In Person or Virtual  Provider:   You may see Brian Crenshaw, MD or one of the following Advanced Practice Providers on your designated Care Team:    Luke Kilroy, PA-C  Callie Goodrich, PA-C  Jesse Cleaver, FNP     

## 2019-09-11 LAB — COMPREHENSIVE METABOLIC PANEL
ALT: 14 IU/L (ref 0–44)
AST: 20 IU/L (ref 0–40)
Albumin/Globulin Ratio: 1.4 (ref 1.2–2.2)
Albumin: 3.8 g/dL (ref 3.7–4.7)
Alkaline Phosphatase: 152 IU/L — ABNORMAL HIGH (ref 39–117)
BUN/Creatinine Ratio: 12 (ref 10–24)
BUN: 17 mg/dL (ref 8–27)
Bilirubin Total: 0.5 mg/dL (ref 0.0–1.2)
CO2: 21 mmol/L (ref 20–29)
Calcium: 9.2 mg/dL (ref 8.6–10.2)
Chloride: 109 mmol/L — ABNORMAL HIGH (ref 96–106)
Creatinine, Ser: 1.45 mg/dL — ABNORMAL HIGH (ref 0.76–1.27)
GFR calc Af Amer: 53 mL/min/{1.73_m2} — ABNORMAL LOW (ref >59)
GFR calc non Af Amer: 46 mL/min/{1.73_m2} — ABNORMAL LOW (ref >59)
Globulin, Total: 2.8 g/dL (ref 1.5–4.5)
Glucose: 100 mg/dL — ABNORMAL HIGH (ref 65–99)
Potassium: 4.5 mmol/L (ref 3.5–5.2)
Sodium: 144 mmol/L (ref 134–144)
Total Protein: 6.6 g/dL (ref 6.0–8.5)

## 2019-09-11 LAB — LIPID PANEL
Chol/HDL Ratio: 3 ratio (ref 0.0–5.0)
Cholesterol, Total: 139 mg/dL (ref 100–199)
HDL: 46 mg/dL (ref >39)
LDL Chol Calc (NIH): 78 mg/dL (ref 0–99)
Triglycerides: 76 mg/dL (ref 0–149)
VLDL Cholesterol Cal: 15 mg/dL (ref 5–40)

## 2019-10-21 ENCOUNTER — Ambulatory Visit: Payer: Medicare PPO | Admitting: Family Medicine

## 2019-11-01 ENCOUNTER — Other Ambulatory Visit: Payer: Self-pay | Admitting: Cardiology

## 2019-11-16 ENCOUNTER — Ambulatory Visit: Payer: Medicare PPO | Admitting: Family Medicine

## 2019-12-28 ENCOUNTER — Telehealth: Payer: Self-pay | Admitting: Internal Medicine

## 2019-12-28 NOTE — Telephone Encounter (Signed)
Patient several years overdue for tcs     Does he need ov or nurse?

## 2019-12-28 NOTE — Telephone Encounter (Signed)
Noted  

## 2019-12-28 NOTE — Telephone Encounter (Signed)
CALLED PATIENT AND SCHEDULED APPOINTMENT

## 2019-12-28 NOTE — Telephone Encounter (Signed)
Needs ov due to age.

## 2020-01-13 ENCOUNTER — Other Ambulatory Visit: Payer: Self-pay | Admitting: Cardiology

## 2020-02-09 DIAGNOSIS — R202 Paresthesia of skin: Secondary | ICD-10-CM | POA: Diagnosis not present

## 2020-02-09 DIAGNOSIS — Z0189 Encounter for other specified special examinations: Secondary | ICD-10-CM | POA: Diagnosis not present

## 2020-02-10 ENCOUNTER — Other Ambulatory Visit: Payer: Self-pay

## 2020-02-10 ENCOUNTER — Other Ambulatory Visit (HOSPITAL_COMMUNITY): Payer: Self-pay | Admitting: Cardiology

## 2020-02-10 ENCOUNTER — Ambulatory Visit (HOSPITAL_COMMUNITY)
Admission: RE | Admit: 2020-02-10 | Discharge: 2020-02-10 | Disposition: A | Discharge status: Home / Self Care | Payer: Medicare PPO | Source: Ambulatory Visit | Attending: Cardiology | Admitting: Cardiology

## 2020-02-10 DIAGNOSIS — I77811 Abdominal aortic ectasia: Secondary | ICD-10-CM

## 2020-02-10 DIAGNOSIS — I714 Abdominal aortic aneurysm, without rupture, unspecified: Secondary | ICD-10-CM

## 2020-02-16 ENCOUNTER — Other Ambulatory Visit: Payer: Self-pay | Admitting: *Deleted

## 2020-02-16 DIAGNOSIS — I714 Abdominal aortic aneurysm, without rupture, unspecified: Secondary | ICD-10-CM

## 2020-02-16 NOTE — Progress Notes (Signed)
v

## 2020-02-23 ENCOUNTER — Other Ambulatory Visit: Payer: Self-pay

## 2020-02-23 ENCOUNTER — Encounter: Payer: Self-pay | Admitting: Gastroenterology

## 2020-02-23 ENCOUNTER — Ambulatory Visit (INDEPENDENT_AMBULATORY_CARE_PROVIDER_SITE_OTHER): Payer: Medicare PPO | Admitting: Gastroenterology

## 2020-02-23 VITALS — BP 145/84 | HR 65 | Temp 96.8°F | Ht 74.0 in | Wt 320.0 lb

## 2020-02-23 DIAGNOSIS — K219 Gastro-esophageal reflux disease without esophagitis: Secondary | ICD-10-CM

## 2020-02-23 DIAGNOSIS — Z86010 Personal history of colonic polyps: Secondary | ICD-10-CM | POA: Diagnosis not present

## 2020-02-23 DIAGNOSIS — K5900 Constipation, unspecified: Secondary | ICD-10-CM | POA: Diagnosis not present

## 2020-02-23 NOTE — Progress Notes (Signed)
Primary Care Physician:  Celene Squibb, MD  Primary Gastroenterologist:  Garfield Cornea, MD   Chief Complaint  Patient presents with  . Colonoscopy    consult  . Constipation    takes Linzess145 when can afford; Linzess helps; has been out of Linzess for approx 3 weeks    HPI:  Gregory Wilson is a 79 y.o. male here for consideration of a surveillance colonoscopy.  His last colonoscopy was in November 2012, he had a tubular adenoma removed at that time and recommended to come back in 5 years for surveillance colonoscopy.  Patient reports chronic constipation. Always feels gassy. linzess is expensive so he tries to spread it out. Previously failed metamucil and miralax due to bloating. Has a lot of noise in his stomach. BM several times per week. No melena, brbpr. No abdominal pain. Intermittent heartburn. Will go few days without Nexium and then get heartburn so he will take a dose. Tries to take only when needed. No heartburn. Occasional solid food dysphagia but not often. No weight loss.  Upcoming appointment with [facility] on 10/13 for lips tingling previously. Patient states it resolved. Has appt with cardiologist on 10/15. Requested most recent labs for review.   Current Outpatient Medications  Medication Sig Dispense Refill  . amLODipine (NORVASC) 10 MG tablet TAKE 1 TABLET EVERY MORNING (KEEP OFFICE VISIT) 90 tablet 3  . aspirin EC 81 MG EC tablet Take 1 tablet (81 mg total) by mouth daily.    Marland Kitchen atorvastatin (LIPITOR) 80 MG tablet TAKE 1 TABLET EVERY DAY  AT  6  PM (KEEP MD APPOINTMENT) 90 tablet 3  . benzonatate (TESSALON) 100 MG capsule Take 1 capsule (100 mg total) by mouth every 8 (eight) hours. (Patient taking differently: Take 100 mg by mouth as needed. ) 21 capsule 0  . esomeprazole (NEXIUM) 40 MG capsule Take 40 mg by mouth daily as needed.     . fluticasone (FLONASE) 50 MCG/ACT nasal spray as needed.     . furosemide (LASIX) 20 MG tablet Take 1 tablet (20 mg total)  by mouth daily. 90 tablet 3  . ibuprofen (ADVIL) 800 MG tablet as needed.     . irbesartan (AVAPRO) 300 MG tablet TAKE 1 TABLET EVERY DAY (KEEP MD APPT) 90 tablet 3  . metoprolol succinate (TOPROL-XL) 100 MG 24 hr tablet TAKE 1 TABLET EVERY DAY  (KEEP  OFFICE  VISIT) 90 tablet 3  . nitroGLYCERIN (NITROSTAT) 0.4 MG SL tablet Place 1 tablet (0.4 mg total) under the tongue every 5 (five) minutes x 3 doses as needed for chest pain. 25 tablet 2  . traZODone (DESYREL) 50 MG tablet Take 50 mg by mouth as needed.     . ezetimibe (ZETIA) 10 MG tablet Take 1 tablet (10 mg total) by mouth daily. 90 tablet 3  . LINZESS 145 MCG CAPS capsule  (Patient not taking: Reported on 02/23/2020)     No current facility-administered medications for this visit.    Allergies as of 02/23/2020 - Review Complete 02/23/2020  Allergen Reaction Noted  . Codeine Nausea And Vomiting 10/28/2008    Past Medical History:  Diagnosis Date  . Acute ST elevation myocardial infarction (STEMI) of anterior wall ([place]) 03/07/2017  . Arthritis   . BPH (benign prostatic hypertrophy)   . CAD (coronary artery disease)    a. Lexiscan Myoview (11/2013):  Normal stress nuclear study.  LV Ejection Fraction: 65%  . Complication of anesthesia    hx PONV  .  Degenerative joint disease   . GERD (gastroesophageal reflux disease)   . H/O hiatal hernia   . History of atrial flutter    REMOTE HX ON TREADMILL  . History of colon polyps   . Hyperlipidemia   . Hypertension   . IBS (irritable bowel syndrome)   . Lower urinary tract symptoms (LUTS)   . OSA (obstructive sleep apnea) 03/26/2016  . RBBB (right bundle branch block)   . S/P drug eluting coronary stent placement    11-26-2001   X2 DES  TO LAD &  BMS  . Schatzki's ring   . Status post dilation of esophageal narrowing     Past Surgical History:  Procedure Laterality Date  . APPENDECTOMY    . CARDIOVASCULAR STRESS TEST  12-09-2013   NUCLEAR LEXISCAN STUDY--  RESULTS PENDING  .  CARDIOVASCULAR STRESS TEST  04/2011  DR CRENSHAW   NORMAL /  NO ISCHEMIA/  EF 67%  . CHOLECYSTECTOMY    . COLONOSCOPY  05/29/2011   RMR: tubular adenoma  . CORONARY ANGIOPLASTY WITH STENT PLACEMENT  11-26-2001  DR Eustace Quail   DES (Cypher) to LAD 70%/  BMS (Express) to OSTIAL D1 80%/   CFX  &  RCA normal/  EF 65%  . CORONARY/GRAFT ACUTE MI REVASCULARIZATION N/A 03/07/2017   Procedure: Coronary/Graft Acute MI Revascularization;  Surgeon: Sherren Mocha, MD;  Location: [place] [facility];  Service: Cardiovascular;  Laterality: N/A;  . ESOPHAGOGASTRODUODENOSCOPY  04/05/2004   RMR: Small hiatal hernia/Questionable subtle Schatzki's ring, status post dilation as described  . ESOPHAGOGASTRODUODENOSCOPY  05/2011   RMR: small hiatal hernia, empiric dilation with 63F and 46F Maloney  . ESOPHAGOGASTRODUODENOSCOPY (EGD) WITH ESOPHAGEAL DILATION N/A 01/16/2013   Dr. Rourk:baggy somewhat atonic esophagus-query underlying esophageal motility disorder. Status post passage of a Maloney dilator (large bore) empirically. Status post esophageal biopsy/Reflux symptoms are well controlled on Nexium.benign path  . LEFT HEART CATH AND CORONARY ANGIOGRAPHY N/A 03/07/2017   Procedure: LEFT HEART CATH AND CORONARY ANGIOGRAPHY;  Surgeon: Sherren Mocha, MD;  Location: [place] [facility];  Service: Cardiovascular;  Laterality: N/A;  . TOTAL HIP ARTHROPLASTY Left 09-07-2003  . TOTAL KNEE ARTHROPLASTY Left 07-27-2002  . TRANSURETHRAL RESECTION OF PROSTATE N/A 12/21/2013   Procedure: TRANSURETHRAL RESECTION OF THE PROSTATE WITH GYRUS INSTRUMENTS;  Surgeon: Jorja Loa, MD;  Location: [facility];  Service: Urology;  Laterality: N/A;    Family History  Problem Relation Age of Onset  . Cancer Father        Lung  . Hypertension Mother   . Coronary artery disease Mother   . Kidney failure Mother   . Cancer Sister        breast carcinoma  . Hypertension Sister   . Colon cancer Neg Hx     Social  History   Socioeconomic History  . Marital status: Married    Spouse name: Not on file  . Number of children: Not on file  . Years of education: Not on file  . Highest education level: Not on file  Occupational History  . Occupation: retired  Tobacco Use  . Smoking status: Never Smoker  . Smokeless tobacco: Never Used  Vaping Use  . Vaping Use: Never used  Substance and Sexual Activity  . Alcohol use: Yes    Comment: on holidays  . Drug use: No  . Sexual activity: Never  Other Topics Concern  . Not on file  Social History Narrative   Retired from [employer]   Social Determinants of Radio broadcast assistant Strain:   . Difficulty of Paying Living Expenses: Not on file  Food Insecurity:   . Worried About Charity fundraiser in the Last Year: Not on file  . Ran Out of Food in the Last Year: Not on file  Transportation Needs:   . Lack of Transportation (Medical): Not on file  . Lack of Transportation (Non-Medical): Not on file  Physical Activity:   . Days of Exercise per Week: Not on file  . Minutes of Exercise per Session: Not on file  Stress:   . Feeling of Stress : Not on file  Social Connections:   . Frequency of Communication with Friends and Family: Not on file  . Frequency of Social Gatherings with Friends and Family: Not on file  . Attends Religious Services: Not on file  . Active Member of Clubs or Organizations: Not on file  . Attends Archivist Meetings: Not on file  . Marital Status: Not on file  Intimate Partner Violence:   . Fear of Current or Ex-Partner: Not on file  . Emotionally Abused: Not on file  . Physically Abused: Not on file  . Sexually Abused: Not on file      ROS:  General: Negative for anorexia, weight loss, fever, chills, fatigue, weakness. Eyes: Negative for vision changes.  ENT: Negative for hoarseness, difficulty swallowing , nasal congestion.see hpi CV: Negative for chest pain, angina, palpitations,  dyspnea on exertion,+ peripheral edema.  Respiratory: Negative for dyspnea at rest, dyspnea on exertion, cough, sputum, wheezing.  GI: See history of present illness. GU:  Negative for dysuria, hematuria, urinary incontinence, urinary frequency, nocturnal urination.  MS: Negative for joint pain, low back pain. Ambulates with cane due to back pain Derm: Negative for rash or itching.  Neuro: Negative for weakness, abnormal sensation, seizure, frequent headaches, memory loss, confusion. See hpi Psych: Negative for anxiety, depression, suicidal ideation, hallucinations.  Endo: Negative for unusual weight change.  Heme: Negative for bruising or bleeding. Allergy: Negative for rash or hives.    Physical Examination:  BP (!) 145/84   Pulse 65   Temp (!) 96.8 F (36 C) (Temporal)   Ht 6\' 2"  (1.88 m)   Wt (!) 320 lb (145.2 kg)   BMI 41.09 kg/m    General: Well-nourished, well-developed in no acute distress. Ambulates with a cane Head: Normocephalic, atraumatic.   Eyes: Conjunctiva pink, no icterus. Mouth: msked Neck: Supple without thyromegaly, masses, or lymphadenopathy.  Lungs: Clear to auscultation bilaterally.  Heart: Regular rate and rhythm, no murmurs rubs or gallops.  Abdomen: Bowel sounds are normal, nontender, nondistended, no hepatosplenomegaly or masses, no abdominal bruits or    hernia , no rebound or guarding.   Rectal: not performed Extremities: 1+ bilateral lower extremity edema. No clubbing or deformities.  Neuro: Alert and oriented x 4 , grossly normal neurologically.  Skin: Warm and dry, no rash or jaundice.   Psych: Alert and cooperative, normal mood and affect.  Labs: Lab Results  Component Value Date   CREATININE 1.45 (H) 09/10/2019   BUN 17 09/10/2019   NA 144 09/10/2019   K 4.5 09/10/2019   CL 109 (H) 09/10/2019   CO2 21 09/10/2019   Lab Results  Component Value Date   ALT 14 09/10/2019   AST 20 09/10/2019   ALKPHOS 152 (H) 09/10/2019   BILITOT 0.5  09/10/2019      Imaging Studies: VAS [place] AAA  DUPLEX  Result Date: 02/10/2020 ABDOMINAL AORTA STUDY Indications: Ectatic abdominal aorta. Patient denies any abdominal pain. Risk Factors: Hypertension, hyperlipidemia, no history of smoking, prior MI. Limitations: Air/bowel gas, obesity and technically challenging and limited examination due to abdominal girth and umbilical cyst making visualization of distal abdominal vessels very difficult.  Comparison Study: In 12/2017, an aorta duplex showed an ectatic proximal                   abdominal aorta measuring 2.8 cm. Patent bilateral common and                   external iliac arteries without stenosis. Umbilical cyst                   measuring 6.9 cm, an increase in diameter from prior exam. Performing Technologist: Sharlett Iles RVT  Examination Guidelines: A complete evaluation includes B-mode imaging, spectral Doppler, color Doppler, and power Doppler as needed of all accessible portions of each vessel. Bilateral testing is considered an integral part of a complete examination. Limited examinations for reoccurring indications may be performed as noted.  Abdominal Aorta Findings: +-----------+-------+----------+----------+---------+--------+--------+ Location   AP (cm)Trans (cm)PSV (cm/s)Waveform ThrombusComments +-----------+-------+----------+----------+---------+--------+--------+ Supraceliac3.00   3.10      69                                  +-----------+-------+----------+----------+---------+--------+--------+ Proximal   2.70   2.70      62                         ectatic  +-----------+-------+----------+----------+---------+--------+--------+ Mid        2.40   2.40      69                                  +-----------+-------+----------+----------+---------+--------+--------+ Distal     2.20   2.20      85                                  +-----------+-------+----------+----------+---------+--------+--------+  RT CIA Prox1.1    1.2       98        triphasic                 +-----------+-------+----------+----------+---------+--------+--------+ RT EIA Prox0.9    0.9       91        triphasic                 +-----------+-------+----------+----------+---------+--------+--------+ LT CIA Prox1.2    1.3       113       triphasic                 +-----------+-------+----------+----------+---------+--------+--------+ LT EIA Prox1.0    1.0       102       triphasic                 +-----------+-------+----------+----------+---------+--------+--------+ IVC/Iliac Findings: +--------+------+--------+--------+   IVC   PatentThrombusComments +--------+------+--------+--------+ IVC Proxpatent                 +--------+------+--------+--------+   Summary: Abdominal Aorta: No evidence of an abdominal aortic aneurysm was visualized. The largest aortic measurement is 2.7 cm. The  largest aortic diameter remains essentially unchanged compared to prior exam. Previous diameter measurement was 2.8 cm obtained on 01/08/2018. Stenosis: Patent bilateral common and external iliac arteries without stenosis. IVC/Iliac: Patent IVC.  Incidental findings: Umbilical cyst measuring 7.9 cm x 7.9 cm, a 1.0 cm increase in diameter compared to the prior exam. *See table(s) above for measurements and observations. Suggest follow up study in 24 months.  Electronically signed by Larae Grooms MD on 02/10/2020 at 5:11:48 PM.    Final     Impression/Plan:  79 y/o male with history of adenomatous colon polyps presents for surveillance colonoscopy, was due in 2017. He has chronic constipation with adequate control on Linzess but difficult to afford for regular use. He requested samples today, which were provided. We discussed guidelines regarding consideration of discontinuing surveillance/screening for colon cancer after the age of 55. Patient desires one additional colonoscopy. Plan for colonoscopy with Dr. Gala Romney  with assistance with anesthesia. ASA III.   He inquires about having an upper endoscopy given his history of chronic GERD and recurrent need for dilation. He has some intermittent heartburn but is not taking his Nexium regularly. Vague intermittent solid food dysphagia. He has some dyspepsia especially at night. Last EGD in 2014. Will discuss with Dr. Gala Romney before scheduling.

## 2020-02-23 NOTE — Patient Instructions (Signed)
1. We will review recent labs from your PCP. 2. Continue Linzess 118mcg daily for constipation. Samples provided.  3. I will discuss with Dr. Gala Romney regarding your request for an upper endoscopy at time of colonoscopy. We should be in touch within the next week, please call if you have not heard from [place].

## 2020-02-25 ENCOUNTER — Telehealth: Payer: Self-pay | Admitting: Gastroenterology

## 2020-02-25 NOTE — Telephone Encounter (Signed)
Gregory Wilson, let pt know when you speak to him we will call to schedule procedure when Dr. Roseanne Kaufman August schedule is available.

## 2020-02-25 NOTE — Telephone Encounter (Signed)
Let pt know that Dr.rourk agreed to EGD at time of colonoscopy.   Please schedule colonoscopy/EGD+/-ED for Dx: dyspepsia, dysphagia, chronic GERD, h/o adenomatous colon polyps.   Needs propofol. ASA III.

## 2020-02-25 NOTE — Telephone Encounter (Signed)
Noted. Spoke with pt. Pt is aware of Dr. Coralee North and Neil Crouch, PA recommendations. Pt is aware that he will receive a call when Dr. Coralee North schedule opens up.

## 2020-03-09 NOTE — Telephone Encounter (Signed)
Called spoke with pt. He has been scheduled for procedures 11/22 at 9:00am. Patient aware will need pre-op/covid test prior. Advised will mail with prep instructions. Confirmed mailing address.

## 2020-03-10 ENCOUNTER — Encounter: Payer: Self-pay | Admitting: *Deleted

## 2020-03-10 NOTE — Telephone Encounter (Signed)
PA approved via [employer] for procedures/ auth# [id number] dates 05/23/2020-06/22/2020

## 2020-04-06 NOTE — Progress Notes (Signed)
HPI: FU CAD s/p DES to LAD and BMS to Dx in 2003,PTCA LAD 9/18,HL, HTN, AFlutter (transient on treadmill - declined anticoagulation).Since last seen,he denies dyspnea, chest pain, palpitations or syncope.  Minimal pedal edema.  Studies: - LHC (10/2001): Mid LAD 70%, Ostial D1 80%, CFX and RCA normal, EF 65%. PCI: Cypher DES to LAD and Express BMS to Dx. -LHC September 2018 showed anterior infarct secondary to partially occlusive thrombus in prior LAD stent successfully treated with heparin, Aggrastat and balloon angioplasty. No other obstructive disease noted. Antiplatelet therapy recommended long-term. - Echocardiogram October 2020 showed normal LV systolic function, mild left ventricular hypertrophy, mild aortic insufficiency. - Nuclear (11/19): No ischemia, EF 69%,  -Abdominal ultrasound(8/21):Abdominal aortic ectasia measuring 2.7 cm. Follow-up recommended 24 months. -Lower extremity venous Dopplers September 2020 negative.  Current Outpatient Medications  Medication Sig Dispense Refill  . ALPRAZolam (XANAX) 0.25 MG tablet Take 1-2 tabs (0.25mg -0.50mg ) 30-60 minutes before procedure. May repeat if needed.Do not drive. 4 tablet 0  . amLODipine (NORVASC) 10 MG tablet TAKE 1 TABLET EVERY MORNING (KEEP OFFICE VISIT) 90 tablet 3  . amoxicillin-clavulanate (AUGMENTIN) 875-125 MG tablet     . aspirin EC 81 MG EC tablet Take 1 tablet (81 mg total) by mouth daily.    Marland Kitchen atorvastatin (LIPITOR) 80 MG tablet TAKE 1 TABLET EVERY DAY  AT  6  PM (KEEP MD APPOINTMENT) 90 tablet 3  . esomeprazole (NEXIUM) 40 MG capsule Take 40 mg by mouth daily as needed.     . fluticasone (FLONASE) 50 MCG/ACT nasal spray as needed.     Marland Kitchen ibuprofen (ADVIL) 800 MG tablet as needed.     . irbesartan (AVAPRO) 300 MG tablet TAKE 1 TABLET EVERY DAY (KEEP MD APPT) 90 tablet 3  . LINZESS 145 MCG CAPS capsule     . metoprolol succinate (TOPROL-XL) 100 MG 24 hr tablet TAKE 1 TABLET EVERY DAY  (KEEP   OFFICE  VISIT) 90 tablet 3  . ezetimibe (ZETIA) 10 MG tablet Take 1 tablet (10 mg total) by mouth daily. 90 tablet 3  . furosemide (LASIX) 20 MG tablet Take 1 tablet (20 mg total) by mouth daily. 90 tablet 3   No current facility-administered medications for this visit.     Past Medical History:  Diagnosis Date  . Acute ST elevation myocardial infarction (STEMI) of anterior wall ([place]) 03/07/2017  . Arthritis   . BPH (benign prostatic hypertrophy)   . CAD (coronary artery disease)    a. Lexiscan Myoview (11/2013):  Normal stress nuclear study.  LV Ejection Fraction: 65%  . Complication of anesthesia    hx PONV  . Degenerative joint disease   . GERD (gastroesophageal reflux disease)   . H/O hiatal hernia   . History of atrial flutter    REMOTE HX ON TREADMILL  . History of colon polyps   . Hyperlipidemia   . Hypertension   . IBS (irritable bowel syndrome)   . Lower urinary tract symptoms (LUTS)   . OSA (obstructive sleep apnea) 03/26/2016  . RBBB (right bundle branch block)   . S/P drug eluting coronary stent placement    11-26-2001   X2 DES  TO LAD &  BMS  . Schatzki's ring   . Status post dilation of esophageal narrowing     Past Surgical History:  Procedure Laterality Date  . APPENDECTOMY    . CARDIOVASCULAR STRESS TEST  12-09-2013   NUCLEAR LEXISCAN STUDY--  RESULTS PENDING  .  CARDIOVASCULAR STRESS TEST  04/2011  DR Teniyah Seivert   NORMAL /  NO ISCHEMIA/  EF 67%  . CHOLECYSTECTOMY    . COLONOSCOPY  05/29/2011   RMR: tubular adenoma  . CORONARY ANGIOPLASTY WITH STENT PLACEMENT  11-26-2001  DR Eustace Quail   DES (Cypher) to LAD 70%/  BMS (Express) to OSTIAL D1 80%/   CFX  &  RCA normal/  EF 65%  . CORONARY/GRAFT ACUTE MI REVASCULARIZATION N/A 03/07/2017   Procedure: Coronary/Graft Acute MI Revascularization;  Surgeon: Sherren Mocha, MD;  Location: [place] [facility];  Service: Cardiovascular;  Laterality: N/A;  . ESOPHAGOGASTRODUODENOSCOPY  04/05/2004   RMR: Small hiatal  hernia/Questionable subtle Schatzki's ring, status post dilation as described  . ESOPHAGOGASTRODUODENOSCOPY  05/2011   RMR: small hiatal hernia, empiric dilation with 29F and 66F Maloney  . ESOPHAGOGASTRODUODENOSCOPY (EGD) WITH ESOPHAGEAL DILATION N/A 01/16/2013   Dr. Rourk:baggy somewhat atonic esophagus-query underlying esophageal motility disorder. Status post passage of a Maloney dilator (large bore) empirically. Status post esophageal biopsy/Reflux symptoms are well controlled on Nexium.benign path  . LEFT HEART CATH AND CORONARY ANGIOGRAPHY N/A 03/07/2017   Procedure: LEFT HEART CATH AND CORONARY ANGIOGRAPHY;  Surgeon: Sherren Mocha, MD;  Location: [place] [facility];  Service: Cardiovascular;  Laterality: N/A;  . TOTAL HIP ARTHROPLASTY Left 09-07-2003  . TOTAL KNEE ARTHROPLASTY Left 07-27-2002  . TRANSURETHRAL RESECTION OF PROSTATE N/A 12/21/2013   Procedure: TRANSURETHRAL RESECTION OF THE PROSTATE WITH GYRUS INSTRUMENTS;  Surgeon: Jorja Loa, MD;  Location: [facility];  Service: Urology;  Laterality: N/A;    Social History   Socioeconomic History  . Marital status: Married    Spouse name: Not on file  . Number of children: 2  . Years of education: Not on file  . Highest education level: Not on file  Occupational History  . Occupation: retired  Tobacco Use  . Smoking status: Never Smoker  . Smokeless tobacco: Never Used  Vaping Use  . Vaping Use: Never used  Substance and Sexual Activity  . Alcohol use: Not Currently    Comment: on holidays  . Drug use: No  . Sexual activity: Never  Other Topics Concern  . Not on file  Social History Narrative   Retired from [employer] at home with wife   Right handed   Caffeine: none   Social Determinants of Health   Financial Resource Strain:   . Difficulty of Paying Living Expenses: Not on file  Food Insecurity:   . Worried About Charity fundraiser in the Last Year: Not on file  .  Ran Out of Food in the Last Year: Not on file  Transportation Needs:   . Lack of Transportation (Medical): Not on file  . Lack of Transportation (Non-Medical): Not on file  Physical Activity:   . Days of Exercise per Week: Not on file  . Minutes of Exercise per Session: Not on file  Stress:   . Feeling of Stress : Not on file  Social Connections:   . Frequency of Communication with Friends and Family: Not on file  . Frequency of Social Gatherings with Friends and Family: Not on file  . Attends Religious Services: Not on file  . Active Member of Clubs or Organizations: Not on file  . Attends Archivist Meetings: Not on file  . Marital Status: Not on file  Intimate Partner Violence:   . Fear of Current or Ex-Partner: Not on file  .  Emotionally Abused: Not on file  . Physically Abused: Not on file  . Sexually Abused: Not on file    Family History  Problem Relation Age of Onset  . Cancer Father        Lung  . Hypertension Mother   . Coronary artery disease Mother   . Kidney failure Mother   . Cancer Sister        breast carcinoma  . Hypertension Sister   . Colon cancer Neg Hx     ROS: Back pain but no fevers or chills, productive cough, hemoptysis, dysphasia, odynophagia, melena, hematochezia, dysuria, hematuria, rash, seizure activity, orthopnea, PND,  claudication. Remaining systems are negative.  Physical Exam: Well-developed well-nourished in no acute distress.  Skin is warm and dry.  HEENT is normal.  Neck is supple.  Chest is clear to auscultation with normal expansion.  Cardiovascular exam is regular rate and rhythm.  Abdominal exam nontender or distended. No masses palpated. Extremities show 1+ edema. neuro grossly intact  ECG-normal sinus rhythm at a rate of 66, right bundle branch block.  Personally reviewed  A/P  1 coronary artery disease-patient denies chest pain.  Continue medical therapy with aspirin and statin.  2 abdominal aortic  ectasia-plan follow-up ultrasound August 2023.  3 hypertension-blood pressure controlled.  Continue present medications.  4 hyperlipidemia-continue statin and zetia.  5 lower extremity edema-this is felt secondary to venous insufficiency.  Continue diuretic at present dose.  6 history of transient atrial flutter at time of prior exercise treadmill-continue beta-blocker.  Previously declined anticoagulation with the understanding of higher risk of CVA.  Kirk Ruths, MD

## 2020-04-11 DIAGNOSIS — J069 Acute upper respiratory infection, unspecified: Secondary | ICD-10-CM | POA: Diagnosis not present

## 2020-04-12 NOTE — Progress Notes (Signed)
[facility]    Provider:  Dr Jaynee Eagles Requesting Provider: Noreene Larsson, NP Primary Care Provider:  Celene Squibb, MD  CC:  Numbness and tingling of the lips and tip of tongue  HPI:  Gregory Wilson is a 79 y.o. male here as requested by Noreene Larsson, NP for numbness and tingling in lips left greater than right and anterior portion of tongue.  I reviewed Demetrius Revel NP's notes: Patient presented in the office with complaints of numbness in the tongue and tingling in the face for 1 week, woke up about a week prior and had facial numbness and tingling in the anterior third of his tongue, denied any other neurologic symptoms, never smoker, morbidly obese, examination including lungs, heart, neurologic exam was nonfocal per Demetrius Revel, specifically noted no dysarthria and able to move tongue past midline bilaterally, neuro exam "unremarkable", they did note his smile was uneven but patient reported this is chronic and no change at all, no dysarthria or aphasia.  There were no labs or brain imaging in request sent.  Review of epic chart shows he was seen by cardiology on August 24 of this year where he reported the symptoms he is being referred for resolved.  Patient is here and reports numbness and tingling of the lips and tip of the tongue, he states is basically almost gone but he still has some tingling and want to make sure everything is okay, he has never experienced this before, no known history of TIA stroke or any neurological condition. Started a month ago, no new medications, no illnesses, no inciting events. Both sides of the lips, tip of the tongue. Getting better. He can tell its there all the time, can feel when moving the tongue. More numbness than tingling. Slowly getting better. Not painful. No canker sores, no rashes, everything looked normal. Never smoked, he was around second hand smoke. No known cancer. He is worried, wants to make sure all if ok. No other  associated focal neurologic deficits, associated symptoms, inciting events or modifiable factors.   Reviewed notes, labs and imaging from outside physicians, which showed:  CT of the head July 14, 2010: showed No acute intracranial abnormalities including mass lesion or mass effect, hydrocephalus, extra-axial fluid collection, midline shift, hemorrhage, or acute infarction, large ischemic events (personally reviewed images)  CMP September 10, 2019 showed BUN 17 and creatinine 1.45.  TSH in 2019 was normal 1.57.    Review of Systems: Patient complains of symptoms per HPI as well as the following symptoms: numbness and tingling. Pertinent negatives and positives per HPI. All others negative.   Social History   Socioeconomic History  . Marital status: Married    Spouse name: Not on file  . Number of children: 2  . Years of education: Not on file  . Highest education level: Not on file  Occupational History  . Occupation: retired  Tobacco Use  . Smoking status: Never Smoker  . Smokeless tobacco: Never Used  Vaping Use  . Vaping Use: Never used  Substance and Sexual Activity  . Alcohol use: Not Currently    Comment: on holidays  . Drug use: No  . Sexual activity: Never  Other Topics Concern  . Not on file  Social History Narrative   Retired from [employer] at home with wife   Right handed   Caffeine: none   Social Determinants of Radio broadcast assistant Strain:   .  Difficulty of Paying Living Expenses: Not on file  Food Insecurity:   . Worried About Charity fundraiser in the Last Year: Not on file  . Ran Out of Food in the Last Year: Not on file  Transportation Needs:   . Lack of Transportation (Medical): Not on file  . Lack of Transportation (Non-Medical): Not on file  Physical Activity:   . Days of Exercise per Week: Not on file  . Minutes of Exercise per Session: Not on file  Stress:   . Feeling of Stress : Not on file  Social Connections:    . Frequency of Communication with Friends and Family: Not on file  . Frequency of Social Gatherings with Friends and Family: Not on file  . Attends Religious Services: Not on file  . Active Member of Clubs or Organizations: Not on file  . Attends Archivist Meetings: Not on file  . Marital Status: Not on file  Intimate Partner Violence:   . Fear of Current or Ex-Partner: Not on file  . Emotionally Abused: Not on file  . Physically Abused: Not on file  . Sexually Abused: Not on file    Family History  Problem Relation Age of Onset  . Cancer Father        Lung  . Hypertension Mother   . Coronary artery disease Mother   . Kidney failure Mother   . Cancer Sister        breast carcinoma  . Hypertension Sister   . Colon cancer Neg Hx     Past Medical History:  Diagnosis Date  . Acute ST elevation myocardial infarction (STEMI) of anterior wall ([place]) 03/07/2017  . Arthritis   . BPH (benign prostatic hypertrophy)   . CAD (coronary artery disease)    a. Lexiscan Myoview (11/2013):  Normal stress nuclear study.  LV Ejection Fraction: 65%  . Complication of anesthesia    hx PONV  . Degenerative joint disease   . GERD (gastroesophageal reflux disease)   . H/O hiatal hernia   . History of atrial flutter    REMOTE HX ON TREADMILL  . History of colon polyps   . Hyperlipidemia   . Hypertension   . IBS (irritable bowel syndrome)   . Lower urinary tract symptoms (LUTS)   . OSA (obstructive sleep apnea) 03/26/2016  . RBBB (right bundle branch block)   . S/P drug eluting coronary stent placement    11-26-2001   X2 DES  TO LAD &  BMS  . Schatzki's ring   . Status post dilation of esophageal narrowing     Patient Active Problem List   Diagnosis Date Noted  . History of colon polyps   . RBBB 05/02/2018  . Chest pain with moderate risk of acute coronary syndrome 05/02/2018  . Unintentional weight loss 05/02/2018  . Morbid obesity ([place]) 03/09/2017  . Acute ST elevation  myocardial infarction (STEMI) of anterior wall ([place]) 03/07/2017  . STEMI involving left anterior descending coronary artery ([place]) 03/07/2017  . OSA (obstructive sleep apnea) 03/26/2016  . Bruit 11/24/2015  . Hypertrophy of prostate with urinary obstruction and other lower urinary tract symptoms (LUTS) 12/21/2013  . Personal history of colonic polyps 08/11/2013  . Dysphagia, unspecified(787.20) 09/11/2012  . Constipation 05/01/2011  . ABDOMINAL PAIN 07/11/2009  . DYSPEPSIA 11/12/2008  . DYSPNEA 10/21/2008  . Dyslipidemia, goal LDL below 70 10/20/2008  . Essential hypertension 10/20/2008  . CAD S/P percutaneous coronary angioplasty 10/20/2008  . Atrial flutter ([place])  10/20/2008  . Esophageal reflux 10/20/2008  . HIATAL HERNIA 10/20/2008  . Spinal stenosis 10/20/2008  . ARTHRITIS 10/20/2008  . OTHER DYSPHAGIA 10/20/2008  . BENIGN PROSTATIC HYPERTROPHY, HX OF 10/20/2008    Past Surgical History:  Procedure Laterality Date  . APPENDECTOMY    . CARDIOVASCULAR STRESS TEST  12-09-2013   NUCLEAR LEXISCAN STUDY--  RESULTS PENDING  . CARDIOVASCULAR STRESS TEST  04/2011  DR CRENSHAW   NORMAL /  NO ISCHEMIA/  EF 67%  . CHOLECYSTECTOMY    . COLONOSCOPY  05/29/2011   RMR: tubular adenoma  . CORONARY ANGIOPLASTY WITH STENT PLACEMENT  11-26-2001  DR Eustace Quail   DES ([facility]) to LAD 70%/  BMS ([facility]) to OSTIAL D1 80%/   CFX  &  RCA normal/  EF 65%  . CORONARY/GRAFT ACUTE MI REVASCULARIZATION N/A 03/07/2017   Procedure: Coronary/Graft Acute MI Revascularization;  Surgeon: Sherren Mocha, MD;  Location: [facility];  Service: Cardiovascular;  Laterality: N/A;  . ESOPHAGOGASTRODUODENOSCOPY  04/05/2004   RMR: Small hiatal hernia/Questionable subtle Schatzki's ring, status post dilation as described  . ESOPHAGOGASTRODUODENOSCOPY  05/2011   RMR: small hiatal hernia, empiric dilation with 40F and 34F Maloney  . ESOPHAGOGASTRODUODENOSCOPY (EGD) WITH ESOPHAGEAL DILATION N/A 01/16/2013   Dr.  Rourk:baggy somewhat atonic esophagus-query underlying esophageal motility disorder. Status post passage of a Maloney dilator (large bore) empirically. Status post esophageal biopsy/Reflux symptoms are well controlled on Nexium.benign path  . LEFT HEART CATH AND CORONARY ANGIOGRAPHY N/A 03/07/2017   Procedure: LEFT HEART CATH AND CORONARY ANGIOGRAPHY;  Surgeon: Sherren Mocha, MD;  Location: [facility];  Service: Cardiovascular;  Laterality: N/A;  . TOTAL HIP ARTHROPLASTY Left 09-07-2003  . TOTAL KNEE ARTHROPLASTY Left 07-27-2002  . TRANSURETHRAL RESECTION OF PROSTATE N/A 12/21/2013   Procedure: TRANSURETHRAL RESECTION OF THE PROSTATE WITH GYRUS INSTRUMENTS;  Surgeon: Jorja Loa, MD;  Location[facility];  Service: Urology;  Laterality: N/A;    Current Outpatient Medications  Medication Sig Dispense Refill  . amLODipine (NORVASC) 10 MG tablet TAKE 1 TABLET EVERY MORNING (KEEP OFFICE VISIT) 90 tablet 3  . AMOXICILLIN PO Take by mouth. 7 day course.    . aspirin EC 81 MG EC tablet Take 1 tablet (81 mg total) by mouth daily.    Marland Kitchen atorvastatin (LIPITOR) 80 MG tablet TAKE 1 TABLET EVERY DAY  AT  6  PM (KEEP MD APPOINTMENT) 90 tablet 3  . esomeprazole (NEXIUM) 40 MG capsule Take 40 mg by mouth daily as needed.     . fluticasone (FLONASE) 50 MCG/ACT nasal spray as needed.     Marland Kitchen ibuprofen (ADVIL) 800 MG tablet as needed.     . irbesartan (AVAPRO) 300 MG tablet TAKE 1 TABLET EVERY DAY (KEEP MD APPT) 90 tablet 3  . LINZESS 145 MCG CAPS capsule     . metoprolol succinate (TOPROL-XL) 100 MG 24 hr tablet TAKE 1 TABLET EVERY DAY  (KEEP  OFFICE  VISIT) 90 tablet 3  . ALPRAZolam (XANAX) 0.25 MG tablet Take 1-2 tabs (0.25mg -0.50mg ) 30-60 minutes before procedure. May repeat if needed.Do not drive. 4 tablet 0  . ezetimibe (ZETIA) 10 MG tablet Take 1 tablet (10 mg total) by mouth daily. 90 tablet 3  . furosemide (LASIX) 20 MG tablet Take 1 tablet (20 mg total) by mouth daily. 90  tablet 3   No current facility-administered medications for this visit.    Allergies as of 04/13/2020 - Review Complete 04/13/2020  Allergen Reaction Noted  .  Codeine Nausea And Vomiting 10/28/2008    Vitals: BP 132/84 (BP Location: Right Arm, Patient Position: Sitting)   Pulse 68   Ht 6\' 1"  (1.854 m)   Wt (!) 318 lb (144.2 kg)   BMI 41.96 kg/m  Last Weight:  Wt Readings from Last 1 Encounters:  04/13/20 (!) 318 lb (144.2 kg)   Last Height:   Ht Readings from Last 1 Encounters:  04/13/20 6\' 1"  (1.854 m)     Physical exam: Exam: Gen: NAD, conversant, well nourised, obese, well groomed                     CV: RRR, no MRG. No Carotid Bruits. +peripheral edema, warm, nontender Eyes: Conjunctivae clear without exudates or hemorrhage mouth: Tongue I large with fissures but I do not see any lesions or abnormal growths.  Neuro: Detailed Neurologic Exam  Speech:    Speech is normal; fluent and spontaneous with normal comprehension.  Cognition:    The patient is oriented to person, place, and time;     recent and remote memory intact;     language fluent;     normal attention, concentration,     fund of knowledge Cranial Nerves:    The pupils are equal, round, and reactive to light. Could not visualize fundi due to small pupils. Visual fields are full to threat in all quadrants. Extraocular movements are intact. Numbness around the mouth. The face is symmetric. The palate elevates in the midline. Hearing intact. Voice is normal. Shoulder shrug is normal. The tongue has normal motion without fasciculations.   Coordination:    No dysmetria or ataxia   Gait:    Antalgic with a cane  Motor Observation:    No asymmetry, no atrophy, and no involuntary movements noted. Tone:    Normal muscle tone.    Posture:    Flexed at waist    Strength: right leg flexion and hip flexion 4/5. Otherwise strength is V/V in the upper and lower limbs.      Sensation: intact to LT       Reflex Exam:  DTR's:    Deep tendon reflexes in the lower extremities are hyporeflexic.    Toes:    The toes are equiv bilaterally.   Clonus:    Clonus is absent.    Assessment/Plan: This is a really nice gentleman with numbness of the tip of his tongue and numbness and tingling in both lips bilaterally.  An unusual presentation, will check a variety of vitamins today as vitamin deficiencies especially vitamin B could cause something like this, we will check his thyroid, his parathyroid, hemoglobin A1c for diabetes, zinc and a few others.  Also need an MRI of the brain with and without contrast to look for any lesions of the cranial nerves, strokes, masses, skull base tumors that may be causing his symptoms.  Likely symptoms are improving but if they do not resolve I would recommend having patient see ear nose and throat to ensure there is no malignancies.   Open MRI with xanax   Orders Placed This Encounter  Procedures  . MR BRAIN W WO CONTRAST  . B12 and Folate Panel  . Methylmalonic acid, serum  . Zinc  . CBC  . Comprehensive metabolic panel  . Ferritin  . PTH, Intact and Calcium  . Vitamin B1  . Vitamin B6  . TSH  . Vitamin B2, Whole Blood  . Hemoglobin A1c     Cc: Pearline Cables,  Trinna Balloon, NP,  Celene Squibb, MD  Sarina Ill, MD  [facility] [address] [place], [place] [address]  Phone [phone] Fax [phone]

## 2020-04-13 ENCOUNTER — Other Ambulatory Visit: Payer: Self-pay

## 2020-04-13 ENCOUNTER — Ambulatory Visit (INDEPENDENT_AMBULATORY_CARE_PROVIDER_SITE_OTHER): Payer: Medicare PPO | Admitting: Neurology

## 2020-04-13 ENCOUNTER — Encounter: Payer: Self-pay | Admitting: Neurology

## 2020-04-13 VITALS — BP 132/84 | HR 68 | Ht 73.0 in | Wt 318.0 lb

## 2020-04-13 DIAGNOSIS — R200 Anesthesia of skin: Secondary | ICD-10-CM | POA: Diagnosis not present

## 2020-04-13 DIAGNOSIS — R202 Paresthesia of skin: Secondary | ICD-10-CM | POA: Diagnosis not present

## 2020-04-13 DIAGNOSIS — R7309 Other abnormal glucose: Secondary | ICD-10-CM | POA: Diagnosis not present

## 2020-04-13 MED ORDER — ALPRAZOLAM 0.25 MG PO TABS: ORAL_TABLET | ORAL | 0 refills | Status: DC

## 2020-04-13 NOTE — Patient Instructions (Addendum)
MRI of the brain  Blood work  Alprazolam tablets What is this medicine? ALPRAZOLAM (al PRAY zoe lam) is a benzodiazepine. It is used to treat anxiety and panic attacks. This medicine may be used for other purposes; ask your health care provider or pharmacist if you have questions. COMMON BRAND NAME(S): Xanax What should I tell my health care provider before I take this medicine? They need to know if you have any of these conditions:  an alcohol or drug abuse problem  bipolar disorder, depression, psychosis or other mental health conditions  glaucoma  kidney or liver disease  lung or breathing disease  myasthenia gravis  Parkinson's disease  porphyria  seizures or a history of seizures  suicidal thoughts  an unusual or allergic reaction to alprazolam, other benzodiazepines, foods, dyes, or preservatives  pregnant or trying to get pregnant  breast-feeding How should I use this medicine? Take this medicine by mouth with a glass of water. Follow the directions on the prescription label. Take your medicine at regular intervals. Do not take it more often than directed. Do not stop taking except on your doctor's advice. A special MedGuide will be given to you by the pharmacist with each prescription and refill. Be sure to read this information carefully each time. Talk to your pediatrician regarding the use of this medicine in children. Special care may be needed. Overdosage: If you think you have taken too much of this medicine contact a poison control center or emergency room at once. NOTE: This medicine is only for you. Do not share this medicine with others. What if I miss a dose? If you miss a dose, take it as soon as you can. If it is almost time for your next dose, take only that dose. Do not take double or extra doses. What may interact with this medicine? Do not take this medicine with any of the following medications:  certain antiviral medicines for HIV or AIDS like  delavirdine, indinavir  certain medicines for fungal infections like ketoconazole and itraconazole  narcotic medicines for cough  sodium oxybate This medicine may also interact with the following medications:  alcohol  antihistamines for allergy, cough and cold  certain antibiotics like clarithromycin, erythromycin, isoniazid, rifampin, rifapentine, rifabutin, and troleandomycin  certain medicines for blood pressure, heart disease, irregular heart beat  certain medicines for depression, like amitriptyline, fluoxetine, sertraline  certain medicines for seizures like carbamazepine, oxcarbazepine, phenobarbital, phenytoin, primidone  cimetidine  cyclosporine  male hormones, like estrogens or progestins and birth control pills, patches, rings, or injections  general anesthetics like halothane, isoflurane, methoxyflurane, propofol  grapefruit juice  local anesthetics like lidocaine, pramoxine, tetracaine  medicines that relax muscles for surgery  narcotic medicines for pain  other antiviral medicines for HIV or AIDS  phenothiazines like chlorpromazine, mesoridazine, prochlorperazine, thioridazine This list may not describe all possible interactions. Give your health care provider a list of all the medicines, herbs, non-prescription drugs, or dietary supplements you use. Also tell them if you smoke, drink alcohol, or use illegal drugs. Some items may interact with your medicine. What should I watch for while using this medicine? Tell your doctor or health care professional if your symptoms do not start to get better or if they get worse. Do not stop taking except on your doctor's advice. You may develop a severe reaction. Your doctor will tell you how much medicine to take. You may get drowsy or dizzy. Do not drive, use machinery, or do anything that needs mental  alertness until you know how this medicine affects you. To reduce the risk of dizzy and fainting spells, do not  stand or sit up quickly, especially if you are an older patient. Alcohol may increase dizziness and drowsiness. Avoid alcoholic drinks. If you are taking another medicine that also causes drowsiness, you may have more side effects. Give your health care provider a list of all medicines you use. Your doctor will tell you how much medicine to take. Do not take more medicine than directed. Call emergency for help if you have problems breathing or unusual sleepiness. What side effects may I notice from receiving this medicine? Side effects that you should report to your doctor or health care professional as soon as possible:  allergic reactions like skin rash, itching or hives, swelling of the face, lips, or tongue  breathing problems  confusion  loss of balance or coordination  signs and symptoms of low blood pressure like dizziness; feeling faint or lightheaded, falls; unusually weak or tired  suicidal thoughts or other mood changes Side effects that usually do not require medical attention (report to your doctor or health care professional if they continue or are bothersome):  dizziness  dry mouth  nausea, vomiting  tiredness This list may not describe all possible side effects. Call your doctor for medical advice about side effects. You may report side effects to FDA at 1-800-FDA-1088. Where should I keep my medicine? Keep out of the reach of children. This medicine can be abused. Keep your medicine in a safe place to protect it from theft. Do not share this medicine with anyone. Selling or giving away this medicine is dangerous and against the law. Store at room temperature between 20 and 25 degrees C (68 and 77 degrees F). This medicine may cause accidental overdose and death if taken by other adults, children, or pets. Mix any unused medicine with a substance like cat litter or coffee grounds. Then throw the medicine away in a sealed container like a sealed bag or a coffee can with a  lid. Do not use the medicine after the expiration date. NOTE: This sheet is a summary. It may not cover all possible information. If you have questions about this medicine, talk to your doctor, pharmacist, or health care provider.  2020 Elsevier/Gold Standard (2015-03-17 13:47:25)

## 2020-04-15 ENCOUNTER — Other Ambulatory Visit: Payer: Self-pay

## 2020-04-15 ENCOUNTER — Ambulatory Visit (INDEPENDENT_AMBULATORY_CARE_PROVIDER_SITE_OTHER): Payer: Medicare PPO | Admitting: Cardiology

## 2020-04-15 ENCOUNTER — Encounter: Payer: Self-pay | Admitting: Cardiology

## 2020-04-15 VITALS — BP 136/80 | HR 66 | Ht 73.0 in | Wt 319.2 lb

## 2020-04-15 DIAGNOSIS — I714 Abdominal aortic aneurysm, without rupture, unspecified: Secondary | ICD-10-CM

## 2020-04-15 DIAGNOSIS — I2510 Atherosclerotic heart disease of native coronary artery without angina pectoris: Secondary | ICD-10-CM | POA: Diagnosis not present

## 2020-04-15 DIAGNOSIS — I10 Essential (primary) hypertension: Secondary | ICD-10-CM

## 2020-04-15 DIAGNOSIS — E785 Hyperlipidemia, unspecified: Secondary | ICD-10-CM

## 2020-04-15 NOTE — Patient Instructions (Signed)

## 2020-04-18 ENCOUNTER — Telehealth: Payer: Self-pay | Admitting: *Deleted

## 2020-04-18 ENCOUNTER — Telehealth: Payer: Self-pay | Admitting: Neurology

## 2020-04-18 NOTE — Telephone Encounter (Signed)
-----   Message from Gregory Beam, MD sent at 04/17/2020  9:51 PM EDT ----- Patient is almost diabetic, he needs to follow up with Allyn Kenner to treat this. Otherwise labs unremarkable so far still awaiting several and we will let him know if any are abnormal thanks (CCing Dr. Nevada Crane)

## 2020-04-18 NOTE — Telephone Encounter (Signed)
[employer] pending  

## 2020-04-18 NOTE — Telephone Encounter (Signed)
I called the pt and discussed that some of his labs have come back, but not all. So far most are unremarkable except his Hgb A1C is 6.3, he is almost diabetic and needs to see primary care to treat this. The pt verbalized understanding. I let him know the results had been sent to Dr Nevada Crane and I encouraged pt to schedule an appointment with him. Pt aware we will call if the rest of the labs are abnormal. He verbalized appreciation.

## 2020-04-18 NOTE — Telephone Encounter (Signed)
Gregory Wilson: [id number] (exp. 04/18/20 to 05/18/20) [employer] no auth via [employer] websiite order faxed to [facility] for the open MRI. They will reach out to the patient to schedule.

## 2020-04-19 LAB — HEMOGLOBIN A1C
Est. average glucose Bld gHb Est-mCnc: 134 mg/dL
Hgb A1c MFr Bld: 6.3 % — ABNORMAL HIGH (ref 4.8–5.6)

## 2020-04-19 LAB — COMPREHENSIVE METABOLIC PANEL
ALT: 14 IU/L (ref 0–44)
AST: 22 IU/L (ref 0–40)
Albumin/Globulin Ratio: 1.5 (ref 1.2–2.2)
Albumin: 4 g/dL (ref 3.7–4.7)
Alkaline Phosphatase: 131 IU/L — ABNORMAL HIGH (ref 44–121)
BUN/Creatinine Ratio: 9 — ABNORMAL LOW (ref 10–24)
BUN: 11 mg/dL (ref 8–27)
Bilirubin Total: 0.3 mg/dL (ref 0.0–1.2)
CO2: 21 mmol/L (ref 20–29)
Calcium: 8.9 mg/dL (ref 8.6–10.2)
Chloride: 105 mmol/L (ref 96–106)
Creatinine, Ser: 1.2 mg/dL (ref 0.76–1.27)
GFR calc Af Amer: 67 mL/min/{1.73_m2} (ref >59)
GFR calc non Af Amer: 58 mL/min/{1.73_m2} — ABNORMAL LOW (ref >59)
Globulin, Total: 2.6 g/dL (ref 1.5–4.5)
Glucose: 84 mg/dL (ref 65–99)
Potassium: 4.3 mmol/L (ref 3.5–5.2)
Sodium: 139 mmol/L (ref 134–144)
Total Protein: 6.6 g/dL (ref 6.0–8.5)

## 2020-04-19 LAB — METHYLMALONIC ACID, SERUM: Methylmalonic Acid: 262 nmol/L (ref 0–378)

## 2020-04-19 LAB — CBC
Hematocrit: 41.8 % (ref 37.5–51.0)
Hemoglobin: 13.7 g/dL (ref 13.0–17.7)
MCH: 27.1 pg (ref 26.6–33.0)
MCHC: 32.8 g/dL (ref 31.5–35.7)
MCV: 83 fL (ref 79–97)
Platelets: 196 10*3/uL (ref 150–450)
RBC: 5.05 x10E6/uL (ref 4.14–5.80)
RDW: 16.7 % — ABNORMAL HIGH (ref 11.6–15.4)
WBC: 5 10*3/uL (ref 3.4–10.8)

## 2020-04-19 LAB — B12 AND FOLATE PANEL
Folate: 4.8 ng/mL (ref >3.0)
Vitamin B-12: 725 pg/mL (ref 232–1245)

## 2020-04-19 LAB — FERRITIN: Ferritin: 182 ng/mL (ref 30–400)

## 2020-04-19 LAB — VITAMIN B1: Thiamine: 70.9 nmol/L (ref 66.5–200.0)

## 2020-04-19 LAB — VITAMIN B6: Vitamin B6: 2.9 ug/L — ABNORMAL LOW (ref 5.3–46.7)

## 2020-04-19 LAB — ZINC: Zinc: 80 ug/dL (ref 44–115)

## 2020-04-19 LAB — PTH, INTACT AND CALCIUM: PTH: 40 pg/mL (ref 15–65)

## 2020-04-19 LAB — TSH: TSH: 0.981 u[IU]/mL (ref 0.450–4.500)

## 2020-04-19 LAB — VITAMIN B2, WHOLE BLOOD: Vitamin B2, Whole Blood: 193 ug/L (ref 137–370)

## 2020-04-19 NOTE — Telephone Encounter (Signed)
I called the pt and discussed the additional lab results which show he has very low Vitamin B6 level which can cause tongue problems like glossitis) and his Thiamine is low normal. Pt was advised to obtain an OTC B complex that a wide range of B vitamins but especially B6 and Thiamine (B1). I let him know the results were already sent to [facility] and Dr Jaynee Eagles advises the pt to follow up with Dr Nevada Crane for a recheck of the levels in 8-12 weeks. The pt verbalized understanding and appreciation. His questions were answered.      Melvenia Beam, MD  04/18/2020 11:51 AM EDT Back to Top    Gregory Wilson: Vitamin b6 is very low and this can cause tongue problems (glossitis)his Thiamine is low-normal. I would get an OTC B complex with both of these in it and start taking it. Make sure it has a wide range of B vitamins in it.(CCing Dr. Nevada Crane again). Follow up for repeat testing with Dr. Nevada Crane in 8-12 weeks.

## 2020-04-27 DIAGNOSIS — R202 Paresthesia of skin: Secondary | ICD-10-CM | POA: Diagnosis not present

## 2020-04-27 DIAGNOSIS — R200 Anesthesia of skin: Secondary | ICD-10-CM | POA: Diagnosis not present

## 2020-05-05 ENCOUNTER — Telehealth: Payer: Self-pay | Admitting: Neurology

## 2020-05-05 NOTE — Telephone Encounter (Signed)
MRI of the brain is normal for age. Great news! Thanks.

## 2020-05-05 NOTE — Telephone Encounter (Signed)
I called the pt and LVM (ok per DPR) advising the MRI brain is normal for his age, no concerns. I left office number for call back with any questions and also advised a message would be sent to his mychart.

## 2020-05-13 DIAGNOSIS — R7301 Impaired fasting glucose: Secondary | ICD-10-CM | POA: Diagnosis not present

## 2020-05-13 DIAGNOSIS — Z0189 Encounter for other specified special examinations: Secondary | ICD-10-CM | POA: Diagnosis not present

## 2020-05-13 DIAGNOSIS — I10 Essential (primary) hypertension: Secondary | ICD-10-CM | POA: Diagnosis not present

## 2020-05-13 DIAGNOSIS — E785 Hyperlipidemia, unspecified: Secondary | ICD-10-CM | POA: Diagnosis not present

## 2020-05-16 ENCOUNTER — Telehealth: Payer: Self-pay | Admitting: Neurology

## 2020-05-16 NOTE — Telephone Encounter (Signed)
See call and message from 05/05/20. Left message and also sent mychart message to pt which he viewed.

## 2020-05-16 NOTE — Telephone Encounter (Signed)
Mri of the brain is normal for age. No reason seen for his numbness/tingling of the lips. (report sent to be scanned into EPIC)thanks

## 2020-05-18 DIAGNOSIS — Z6841 Body Mass Index (BMI) 40.0 and over, adult: Secondary | ICD-10-CM | POA: Diagnosis not present

## 2020-05-18 DIAGNOSIS — I252 Old myocardial infarction: Secondary | ICD-10-CM | POA: Diagnosis not present

## 2020-05-18 DIAGNOSIS — R7301 Impaired fasting glucose: Secondary | ICD-10-CM | POA: Diagnosis not present

## 2020-05-18 DIAGNOSIS — K5900 Constipation, unspecified: Secondary | ICD-10-CM | POA: Diagnosis not present

## 2020-05-18 DIAGNOSIS — E782 Mixed hyperlipidemia: Secondary | ICD-10-CM | POA: Diagnosis not present

## 2020-05-18 DIAGNOSIS — Z955 Presence of coronary angioplasty implant and graft: Secondary | ICD-10-CM | POA: Diagnosis not present

## 2020-05-18 DIAGNOSIS — R600 Localized edema: Secondary | ICD-10-CM | POA: Diagnosis not present

## 2020-05-18 DIAGNOSIS — K219 Gastro-esophageal reflux disease without esophagitis: Secondary | ICD-10-CM | POA: Diagnosis not present

## 2020-05-18 DIAGNOSIS — I10 Essential (primary) hypertension: Secondary | ICD-10-CM | POA: Diagnosis not present

## 2020-05-19 NOTE — Patient Instructions (Addendum)
Your procedure is scheduled on: 05/24/2011  Report to [facility] at 7:30    AM.  Call this number if you have problems the morning of surgery: [phone]   Remember:              Follow Directions on the letter you received from Your Physician's office regarding the Bowel Prep              No Smoking the day of Procedure :   Take these medicines the morning of surgery with A SIP OF WATER: Amlodipine, Nexium, Zetia, metoprolol, and avapro   flonase if needed   Do not wear jewelry, make-up or nail polish.    Do not bring valuables to the hospital.  Contacts, dentures or bridgework may not be worn into surgery.  .   Patients discharged the day of surgery will not be allowed to drive home.     Colonoscopy, Adult, Care After This sheet gives you information about how to care for yourself after your procedure. Your health care provider may also give you more specific instructions. If you have problems or questions, contact your health care provider. What can I expect after the procedure? After the procedure, it is common to have:  A small amount of blood in your stool for 24 hours after the procedure.  Some gas.  Mild abdominal cramping or bloating.  Follow these instructions at home: General instructions   For the first 24 hours after the procedure: ? Do not drive or use machinery. ? Do not sign important documents. ? Do not drink alcohol. ? Do your regular daily activities at a slower pace than normal. ? Eat soft, easy-to-digest foods. ? Rest often.  Take over-the-counter or prescription medicines only as told by your health care provider.  It is up to you to get the results of your procedure. Ask your health care provider, or the department performing the procedure, when your results will be ready. Relieving cramping and bloating  Try walking around when you have cramps or feel bloated.  Apply heat to your abdomen as told by your health care provider. Use a heat  source that your health care provider recommends, such as a moist heat pack or a heating pad. ? Place a towel between your skin and the heat source. ? Leave the heat on for 20-30 minutes. ? Remove the heat if your skin turns bright red. This is especially important if you are unable to feel pain, heat, or cold. You may have a greater risk of getting burned. Eating and drinking  Drink enough fluid to keep your urine clear or pale yellow.  Resume your normal diet as instructed by your health care provider. Avoid heavy or fried foods that are hard to digest.  Avoid drinking alcohol for as long as instructed by your health care provider. Contact a health care provider if:  You have blood in your stool 2-3 days after the procedure. Get help right away if:  You have more than a small spotting of blood in your stool.  You pass large blood clots in your stool.  Your abdomen is swollen.  You have nausea or vomiting.  You have a fever.  You have increasing abdominal pain that is not relieved with medicine. This information is not intended to replace advice given to you by your health care provider. Make sure you discuss any questions you have with your health care provider. Document Released: 01/31/2004 Document Revised: 03/12/2016 Document Reviewed:  08/30/2015 Elsevier Interactive Patient Education  2018 [place].    Upper Endoscopy, Adult, Care After This sheet gives you information about how to care for yourself after your procedure. Your health care provider may also give you more specific instructions. If you have problems or questions, contact your health care provider. What can I expect after the procedure? After the procedure, it is common to have: A sore throat. Mild stomach pain or discomfort. Bloating. Nausea. Follow these instructions at home:  Follow instructions from your health care provider about what to eat or drink after your procedure. Return to your normal  activities as told by your health care provider. Ask your health care provider what activities are safe for you. Take over-the-counter and prescription medicines only as told by your health care provider. Do not drive for 24 hours if you were given a sedative during your procedure. Keep all follow-up visits as told by your health care provider. This is important. Contact a health care provider if you have: A sore throat that lasts longer than one day. Trouble swallowing. Get help right away if: You vomit blood or your vomit looks like coffee grounds. You have: A fever. Bloody, black, or tarry stools. A severe sore throat or you cannot swallow. Difficulty breathing. Severe pain in your chest or abdomen. Summary After the procedure, it is common to have a sore throat, mild stomach discomfort, bloating, and nausea. Do not drive for 24 hours if you were given a sedative during the procedure. Follow instructions from your health care provider about what to eat or drink after your procedure. Return to your normal activities as told by your health care provider. This information is not intended to replace advice given to you by your health care provider. Make sure you discuss any questions you have with your health care provider. Document Revised: 12/10/2017 Document Reviewed: 11/18/2017 Elsevier Patient Education  [place].

## 2020-05-20 ENCOUNTER — Other Ambulatory Visit (HOSPITAL_COMMUNITY)
Admission: RE | Admit: 2020-05-20 | Discharge: 2020-05-20 | Disposition: A | Discharge status: Home / Self Care | Payer: Medicare PPO | Source: Ambulatory Visit | Attending: Internal Medicine | Admitting: Internal Medicine

## 2020-05-20 ENCOUNTER — Encounter (HOSPITAL_COMMUNITY): Payer: Self-pay

## 2020-05-20 ENCOUNTER — Encounter (HOSPITAL_COMMUNITY)
Admission: RE | Admit: 2020-05-20 | Discharge: 2020-05-20 | Disposition: A | Discharge status: Home / Self Care | Payer: Medicare PPO | Source: Ambulatory Visit | Attending: Internal Medicine | Admitting: Internal Medicine

## 2020-05-20 ENCOUNTER — Other Ambulatory Visit: Payer: Self-pay

## 2020-05-20 DIAGNOSIS — Z20822 Contact with and (suspected) exposure to covid-19: Secondary | ICD-10-CM | POA: Insufficient documentation

## 2020-05-20 DIAGNOSIS — Z01812 Encounter for preprocedural laboratory examination: Secondary | ICD-10-CM | POA: Insufficient documentation

## 2020-05-20 LAB — SARS CORONAVIRUS 2 (TAT 6-24 HRS): SARS Coronavirus 2: NEGATIVE

## 2020-05-23 ENCOUNTER — Ambulatory Visit (HOSPITAL_COMMUNITY)
Admission: RE | Admit: 2020-05-23 | Discharge: 2020-05-23 | Disposition: A | Discharge status: Home / Self Care | Payer: Medicare PPO | Attending: Internal Medicine | Admitting: Internal Medicine

## 2020-05-23 ENCOUNTER — Ambulatory Visit (HOSPITAL_COMMUNITY): Payer: Medicare PPO | Admitting: Anesthesiology

## 2020-05-23 ENCOUNTER — Encounter (HOSPITAL_COMMUNITY)
Admission: RE | Disposition: A | Discharge status: Home / Self Care | Payer: Self-pay | Source: Home / Self Care | Attending: Internal Medicine

## 2020-05-23 ENCOUNTER — Encounter (HOSPITAL_COMMUNITY): Payer: Self-pay | Admitting: Internal Medicine

## 2020-05-23 ENCOUNTER — Encounter: Payer: Self-pay | Admitting: Internal Medicine

## 2020-05-23 DIAGNOSIS — D123 Benign neoplasm of transverse colon: Secondary | ICD-10-CM | POA: Insufficient documentation

## 2020-05-23 DIAGNOSIS — K449 Diaphragmatic hernia without obstruction or gangrene: Secondary | ICD-10-CM | POA: Diagnosis not present

## 2020-05-23 DIAGNOSIS — Z7982 Long term (current) use of aspirin: Secondary | ICD-10-CM | POA: Insufficient documentation

## 2020-05-23 DIAGNOSIS — K219 Gastro-esophageal reflux disease without esophagitis: Secondary | ICD-10-CM | POA: Diagnosis not present

## 2020-05-23 DIAGNOSIS — I10 Essential (primary) hypertension: Secondary | ICD-10-CM | POA: Diagnosis not present

## 2020-05-23 DIAGNOSIS — Z79899 Other long term (current) drug therapy: Secondary | ICD-10-CM | POA: Diagnosis not present

## 2020-05-23 DIAGNOSIS — K635 Polyp of colon: Secondary | ICD-10-CM | POA: Diagnosis not present

## 2020-05-23 DIAGNOSIS — Z1211 Encounter for screening for malignant neoplasm of colon: Secondary | ICD-10-CM | POA: Insufficient documentation

## 2020-05-23 DIAGNOSIS — D124 Benign neoplasm of descending colon: Secondary | ICD-10-CM | POA: Insufficient documentation

## 2020-05-23 DIAGNOSIS — R1310 Dysphagia, unspecified: Secondary | ICD-10-CM | POA: Insufficient documentation

## 2020-05-23 DIAGNOSIS — I2510 Atherosclerotic heart disease of native coronary artery without angina pectoris: Secondary | ICD-10-CM | POA: Diagnosis not present

## 2020-05-23 DIAGNOSIS — Z86010 Personal history of colonic polyps: Secondary | ICD-10-CM | POA: Diagnosis not present

## 2020-05-23 DIAGNOSIS — K640 First degree hemorrhoids: Secondary | ICD-10-CM | POA: Diagnosis not present

## 2020-05-23 DIAGNOSIS — K5730 Diverticulosis of large intestine without perforation or abscess without bleeding: Secondary | ICD-10-CM | POA: Insufficient documentation

## 2020-05-23 DIAGNOSIS — Z885 Allergy status to narcotic agent status: Secondary | ICD-10-CM | POA: Insufficient documentation

## 2020-05-23 HISTORY — PX: POLYPECTOMY: SHX5525

## 2020-05-23 HISTORY — PX: MALONEY DILATION: SHX5535

## 2020-05-23 HISTORY — PX: COLONOSCOPY WITH PROPOFOL: SHX5780

## 2020-05-23 HISTORY — PX: ESOPHAGOGASTRODUODENOSCOPY (EGD) WITH PROPOFOL: SHX5813

## 2020-05-23 SURGERY — COLONOSCOPY WITH PROPOFOL
Anesthesia: General

## 2020-05-23 MED ORDER — GLYCOPYRROLATE 0.2 MG/ML IJ SOLN
INTRAMUSCULAR | Status: DC | PRN
  Administered 2020-05-23: .2 mg via INTRAVENOUS

## 2020-05-23 MED ORDER — LIDOCAINE HCL (CARDIAC) PF 100 MG/5ML IV SOSY
PREFILLED_SYRINGE | INTRAVENOUS | Status: DC | PRN
  Administered 2020-05-23: 50 mg via INTRAVENOUS

## 2020-05-23 MED ORDER — LIDOCAINE VISCOUS HCL 2 % MT SOLN
15.0000 mL | Freq: Once | OROMUCOSAL | Status: AC
  Administered 2020-05-23: 15 mL via OROMUCOSAL

## 2020-05-23 MED ORDER — LACTATED RINGERS IV SOLN: INTRAVENOUS | Status: DC | PRN

## 2020-05-23 MED ORDER — KETAMINE HCL 50 MG/5ML IJ SOSY
PREFILLED_SYRINGE | INTRAMUSCULAR | Status: AC
  Filled 2020-05-23: qty 5

## 2020-05-23 MED ORDER — PROPOFOL 500 MG/50ML IV EMUL
INTRAVENOUS | Status: DC | PRN
  Administered 2020-05-23 (×2): 100 ug/kg/min via INTRAVENOUS
  Administered 2020-05-23: 125 ug/kg/min via INTRAVENOUS

## 2020-05-23 MED ORDER — GLYCOPYRROLATE PF 0.2 MG/ML IJ SOSY
PREFILLED_SYRINGE | INTRAMUSCULAR | Status: AC
  Filled 2020-05-23: qty 1

## 2020-05-23 MED ORDER — STERILE WATER FOR IRRIGATION IR SOLN
Status: DC | PRN
  Administered 2020-05-23: 1.5 mL

## 2020-05-23 MED ORDER — KETAMINE HCL 10 MG/ML IJ SOLN
INTRAMUSCULAR | Status: DC | PRN
  Administered 2020-05-23: 20 mg via INTRAVENOUS

## 2020-05-23 MED ORDER — PROPOFOL 10 MG/ML IV BOLUS
INTRAVENOUS | Status: DC | PRN
  Administered 2020-05-23: 40 mg via INTRAVENOUS

## 2020-05-23 MED ORDER — LACTATED RINGERS IV SOLN: Freq: Once | INTRAVENOUS | Status: AC

## 2020-05-23 MED ORDER — EPHEDRINE SULFATE 50 MG/ML IJ SOLN
INTRAMUSCULAR | Status: DC | PRN
  Administered 2020-05-23 (×3): 10 mg via INTRAVENOUS

## 2020-05-23 MED ORDER — LIDOCAINE VISCOUS HCL 2 % MT SOLN
OROMUCOSAL | Status: AC
  Filled 2020-05-23: qty 15

## 2020-05-23 MED ORDER — EPHEDRINE 5 MG/ML INJ
INTRAVENOUS | Status: AC
  Filled 2020-05-23: qty 10

## 2020-05-23 NOTE — Discharge Instructions (Signed)
Colonoscopy Discharge Instructions  Read the instructions outlined below and refer to this sheet in the next few weeks. These discharge instructions provide you with general information on caring for yourself after you leave the hospital. Your doctor may also give you specific instructions. While your treatment has been planned according to the most current medical practices available, unavoidable complications occasionally occur. If you have any problems or questions after discharge, call Dr. Gala Romney at [phone]. ACTIVITY  You may resume your regular activity, but move at a slower pace for the next 24 hours.   Take frequent rest periods for the next 24 hours.   Walking will help get rid of the air and reduce the bloated feeling in your belly (abdomen).   No driving for 24 hours (because of the medicine (anesthesia) used during the test).    Do not sign any important legal documents or operate any machinery for 24 hours (because of the anesthesia used during the test).  NUTRITION  Drink plenty of fluids.   You may resume your normal diet as instructed by your doctor.   Begin with a light meal and progress to your normal diet. Heavy or fried foods are harder to digest and may make you feel sick to your stomach (nauseated).   Avoid alcoholic beverages for 24 hours or as instructed.  MEDICATIONS  You may resume your normal medications unless your doctor tells you otherwise.  WHAT YOU CAN EXPECT TODAY  Some feelings of bloating in the abdomen.   Passage of more gas than usual.   Spotting of blood in your stool or on the toilet paper.  IF YOU HAD POLYPS REMOVED DURING THE COLONOSCOPY:  No aspirin products for 7 days or as instructed.   No alcohol for 7 days or as instructed.   Eat a soft diet for the next 24 hours.  FINDING OUT THE RESULTS OF YOUR TEST Not all test results are available during your visit. If your test results are not back during the visit, make an appointment  with your caregiver to find out the results. Do not assume everything is normal if you have not heard from your caregiver or the medical facility. It is important for you to follow up on all of your test results.  SEEK IMMEDIATE MEDICAL ATTENTION IF:  You have more than a spotting of blood in your stool.   Your belly is swollen (abdominal distention).   You are nauseated or vomiting.   You have a temperature over 101.   You have abdominal pain or discomfort that is severe or gets worse throughout the day.    EGD Discharge instructions Please read the instructions outlined below and refer to this sheet in the next few weeks. These discharge instructions provide you with general information on caring for yourself after you leave the hospital. Your doctor may also give you specific instructions. While your treatment has been planned according to the most current medical practices available, unavoidable complications occasionally occur. If you have any problems or questions after discharge, please call your doctor. ACTIVITY  You may resume your regular activity but move at a slower pace for the next 24 hours.   Take frequent rest periods for the next 24 hours.   Walking will help expel (get rid of) the air and reduce the bloated feeling in your abdomen.   No driving for 24 hours (because of the anesthesia (medicine) used during the test).   You may shower.   Do not sign  any important legal documents or operate any machinery for 24 hours (because of the anesthesia used during the test).  NUTRITION  Drink plenty of fluids.   You may resume your normal diet.   Begin with a light meal and progress to your normal diet.   Avoid alcoholic beverages for 24 hours or as instructed by your caregiver.  MEDICATIONS  You may resume your normal medications unless your caregiver tells you otherwise.  WHAT YOU CAN EXPECT TODAY  You may experience abdominal discomfort such as a feeling of  fullness or "gas" pains.  FOLLOW-UP  Your doctor will discuss the results of your test with you.  SEEK IMMEDIATE MEDICAL ATTENTION IF ANY OF THE FOLLOWING OCCUR:  Excessive nausea (feeling sick to your stomach) and/or vomiting.   Severe abdominal pain and distention (swelling).   Trouble swallowing.   Temperature over 101 F (37.8 C).   Rectal bleeding or vomiting of blood.   Your esophagus was stretched today  Multiple polyps removed from your colon  Further recommendations to follow pending review of pathology report  At patient request, I called Wylan Gentzler at [phone] -rolled to voicemail.-Left message with findings      Colon Polyps  Polyps are tissue growths inside the body. Polyps can grow in many places, including the large intestine (colon). A polyp may be a round bump or a mushroom-shaped growth. You could have one polyp or several. Most colon polyps are noncancerous (benign). However, some colon polyps can become cancerous over time. Finding and removing the polyps early can help prevent this. What are the causes? The exact cause of colon polyps is not known. What increases the risk? You are more likely to develop this condition if you:  Have a family history of colon cancer or colon polyps.  Are older than 24 or older than 45 if you are African American.  Have inflammatory bowel disease, such as ulcerative colitis or Crohn's disease.  Have certain hereditary conditions, such as: ? Familial adenomatous polyposis. ? Lynch syndrome. ? Turcot syndrome. ? Peutz-Jeghers syndrome.  Are overweight.  Smoke cigarettes.  Do not get enough exercise.  Drink too much alcohol.  Eat a diet that is high in fat and red meat and low in fiber.  Had childhood cancer that was treated with abdominal radiation. What are the signs or symptoms? Most polyps do not cause symptoms. If you have symptoms, they may include:  Blood coming from your rectum when  having a bowel movement.  Blood in your stool. The stool may look dark red or black.  Abdominal pain.  A change in bowel habits, such as constipation or diarrhea. How is this diagnosed? This condition is diagnosed with a colonoscopy. This is a procedure in which a lighted, flexible scope is inserted into the anus and then passed into the colon to examine the area. Polyps are sometimes found when a colonoscopy is done as part of routine cancer screening tests. How is this treated? Treatment for this condition involves removing any polyps that are found. Most polyps can be removed during a colonoscopy. Those polyps will then be tested for cancer. Additional treatment may be needed depending on the results of testing. Follow these instructions at home: Lifestyle  Maintain a healthy weight, or lose weight if recommended by your health care provider.  Exercise every day or as told by your health care provider.  Do not use any products that contain nicotine or tobacco, such as cigarettes and e-cigarettes. If you  need help quitting, ask your health care provider.  If you drink alcohol, limit how much you have: ? 0-1 drink a day for women. ? 0-2 drinks a day for men.  Be aware of how much alcohol is in your drink. In the [place] one drink equals one 12 oz bottle of beer (355 mL), one 5 oz glass of wine (148 mL), or one 1 oz shot of hard liquor (44 mL). Eating and drinking   Eat foods that are high in fiber, such as fruits, vegetables, and whole grains.  Eat foods that are high in calcium and vitamin D, such as milk, cheese, yogurt, eggs, liver, fish, and broccoli.  Limit foods that are high in fat, such as fried foods and desserts.  Limit the amount of red meat and processed meat you eat, such as hot dogs, sausage, bacon, and lunch meats. General instructions  Keep all follow-up visits as told by your health care provider. This is important. ? This includes having regularly scheduled  colonoscopies. ? Talk to your health care provider about when you need a colonoscopy. Contact a health care provider if:  You have new or worsening bleeding during a bowel movement.  You have new or increased blood in your stool.  You have a change in bowel habits.  You lose weight for no known reason. Summary  Polyps are tissue growths inside the body. Polyps can grow in many places, including the colon.  Most colon polyps are noncancerous (benign), but some can become cancerous over time.  This condition is diagnosed with a colonoscopy.  Treatment for this condition involves removing any polyps that are found. Most polyps can be removed during a colonoscopy. This information is not intended to replace advice given to you by your health care provider. Make sure you discuss any questions you have with your health care provider. Document Revised: 10/03/2017 Document Reviewed: 10/03/2017 Elsevier Patient Education  [place] After These instructions provide you with information about caring for yourself after your procedure. Your health care provider may also give you more specific instructions. Your treatment has been planned according to current medical practices, but problems sometimes occur. Call your health care provider if you have any problems or questions after your procedure. What can I expect after the procedure? After your procedure, you may:  Feel sleepy for several hours.  Feel clumsy and have poor balance for several hours.  Feel forgetful about what happened after the procedure.  Have poor judgment for several hours.  Feel nauseous or vomit.  Have a sore throat if you had a breathing tube during the procedure. Follow these instructions at home: For at least 24 hours after the procedure:      Have a responsible adult stay with you. It is important to have someone help care for you until you are awake and  alert.  Rest as needed.  Do not: ? Participate in activities in which you could fall or become injured. ? Drive. ? Use heavy machinery. ? Drink alcohol. ? Take sleeping pills or medicines that cause drowsiness. ? Make important decisions or sign legal documents. ? Take care of children on your own. Eating and drinking  Follow the diet that is recommended by your health care provider.  If you vomit, drink water, juice, or soup when you can drink without vomiting.  Make sure you have little or no nausea before eating solid foods. General instructions  Take  over-the-counter and prescription medicines only as told by your health care provider.  If you have sleep apnea, surgery and certain medicines can increase your risk for breathing problems. Follow instructions from your health care provider about wearing your sleep device: ? Anytime you are sleeping, including during daytime naps. ? While taking prescription pain medicines, sleeping medicines, or medicines that make you drowsy.  If you smoke, do not smoke without supervision.  Keep all follow-up visits as told by your health care provider. This is important. Contact a health care provider if:  You keep feeling nauseous or you keep vomiting.  You feel light-headed.  You develop a rash.  You have a fever. Get help right away if:  You have trouble breathing. Summary  For several hours after your procedure, you may feel sleepy and have poor judgment.  Have a responsible adult stay with you for at least 24 hours or until you are awake and alert. This information is not intended to replace advice given to you by your health care provider. Make sure you discuss any questions you have with your health care provider. Document Revised: 09/16/2017 Document Reviewed: 10/09/2015 Elsevier Patient Education  [place].

## 2020-05-23 NOTE — H&P (Signed)
@LOGO @   Primary Care Physician:  Celene Squibb, MD Primary Gastroenterologist:  Dr. Gala Romney  Pre-Procedure History & Physical: HPI:  Gregory Wilson is a 79 y.o. male here for further evaluation management of dysphagia via EGD and surveillance colonoscopy history colonic adenoma.  Past Medical History:  Diagnosis Date  . Acute ST elevation myocardial infarction (STEMI) of anterior wall ([place]) 03/07/2017  . Arthritis   . BPH (benign prostatic hypertrophy)   . CAD (coronary artery disease)    a. Lexiscan Myoview (11/2013):  Normal stress nuclear study.  LV Ejection Fraction: 65%  . Complication of anesthesia    hx PONV  . Degenerative joint disease   . GERD (gastroesophageal reflux disease)   . H/O hiatal hernia   . History of atrial flutter    REMOTE HX ON TREADMILL  . History of colon polyps   . Hyperlipidemia   . Hypertension   . IBS (irritable bowel syndrome)   . Lower urinary tract symptoms (LUTS)   . OSA (obstructive sleep apnea) 03/26/2016  . RBBB (right bundle branch block)   . S/P drug eluting coronary stent placement    11-26-2001   X2 DES  TO LAD &  BMS  . Schatzki's ring   . Status post dilation of esophageal narrowing     Past Surgical History:  Procedure Laterality Date  . APPENDECTOMY    . CARDIOVASCULAR STRESS TEST  12-09-2013   NUCLEAR LEXISCAN STUDY--  RESULTS PENDING  . CARDIOVASCULAR STRESS TEST  04/2011  DR CRENSHAW   NORMAL /  NO ISCHEMIA/  EF 67%  . CHOLECYSTECTOMY    . COLONOSCOPY  05/29/2011   RMR: tubular adenoma  . CORONARY ANGIOPLASTY WITH STENT PLACEMENT  11-26-2001  DR Eustace Quail   DES ([facility]) to LAD 70%/  BMS ([facility]) to OSTIAL D1 80%/   CFX  &  RCA normal/  EF 65%  . CORONARY/GRAFT ACUTE MI REVASCULARIZATION N/A 03/07/2017   Procedure: Coronary/Graft Acute MI Revascularization;  Surgeon: Sherren Mocha, MD;  Location: [facility];  Service: Cardiovascular;  Laterality: N/A;  . ESOPHAGOGASTRODUODENOSCOPY  04/05/2004   RMR: Small  hiatal hernia/Questionable subtle Schatzki's ring, status post dilation as described  . ESOPHAGOGASTRODUODENOSCOPY  05/2011   RMR: small hiatal hernia, empiric dilation with 11F and 81F Maloney  . ESOPHAGOGASTRODUODENOSCOPY (EGD) WITH ESOPHAGEAL DILATION N/A 01/16/2013   Dr. Wyonia Fontanella:baggy somewhat atonic esophagus-query underlying esophageal motility disorder. Status post passage of a Maloney dilator (large bore) empirically. Status post esophageal biopsy/Reflux symptoms are well controlled on Nexium.benign path  . LEFT HEART CATH AND CORONARY ANGIOGRAPHY N/A 03/07/2017   Procedure: LEFT HEART CATH AND CORONARY ANGIOGRAPHY;  Surgeon: Sherren Mocha, MD;  Location: [facility];  Service: Cardiovascular;  Laterality: N/A;  . TOTAL HIP ARTHROPLASTY Left 09-07-2003  . TOTAL KNEE ARTHROPLASTY Left 07-27-2002  . TRANSURETHRAL RESECTION OF PROSTATE N/A 12/21/2013   Procedure: TRANSURETHRAL RESECTION OF THE PROSTATE WITH GYRUS INSTRUMENTS;  Surgeon: Jorja Loa, MD;  Location: [facility];  Service: Urology;  Laterality: N/A;    Prior to Admission medications   Medication Sig Start Date End Date Taking? Authorizing Provider  amLODipine (NORVASC) 10 MG tablet TAKE 1 TABLET EVERY MORNING (KEEP OFFICE VISIT) Patient taking differently: Take 10 mg by mouth in the morning.  11/03/19  Yes Lelon Perla, MD  aspirin EC 81 MG EC tablet Take 1 tablet (81 mg total) by mouth daily. 03/09/17  Yes Lyda Jester M, PA-C  atorvastatin (  LIPITOR) 80 MG tablet TAKE 1 TABLET EVERY DAY  AT  6  PM (KEEP MD APPOINTMENT) Patient taking differently: Take 80 mg by mouth in the morning.  01/14/20  Yes Lelon Perla, MD  esomeprazole (NEXIUM) 40 MG capsule Take 40 mg by mouth daily as needed (acid reflux).    Yes [provider]  fluticasone (FLONASE) 50 MCG/ACT nasal spray Place 1 spray into both nostrils daily as needed for allergies.  06/13/19  Yes [provider]  furosemide  (LASIX) 20 MG tablet Take 1 tablet (20 mg total) by mouth daily. 07/24/19 05/17/20 Yes Lelon Perla, MD  ibuprofen (ADVIL) 800 MG tablet Take 800 mg by mouth every 8 (eight) hours as needed for moderate pain.  08/24/19  Yes [provider]  irbesartan (AVAPRO) 300 MG tablet TAKE 1 TABLET EVERY DAY (KEEP MD APPT) Patient taking differently: Take 300 mg by mouth daily.  11/03/19  Yes Lelon Perla, MD  LINZESS 145 MCG CAPS capsule Take 145 mcg by mouth daily as needed (constipation).  06/15/19  Yes [provider]  metoprolol succinate (TOPROL-XL) 100 MG 24 hr tablet TAKE 1 TABLET EVERY DAY  (KEEP  OFFICE  VISIT) Patient taking differently: Take 100 mg by mouth daily.  11/03/19  Yes Lelon Perla, MD  PYRIDOXINE HCL PO Take 2 tablets by mouth daily.   Yes [provider]  TURMERIC-GINGER PO Take 2 tablets by mouth daily.   Yes [provider]  ALPRAZolam (XANAX) 0.25 MG tablet Take 1-2 tabs (0.25mg -0.50mg ) 30-60 minutes before procedure. May repeat if needed.Do not drive. Patient not taking: Reported on 05/17/2020 04/13/20   Melvenia Beam, MD  amoxicillin-clavulanate (AUGMENTIN) [phone] MG tablet  04/11/20   [provider]  ezetimibe (ZETIA) 10 MG tablet Take 1 tablet (10 mg total) by mouth daily. 08/06/18 11/04/18  Lelon Perla, MD    Allergies as of 03/09/2020 - Review Complete 02/23/2020  Allergen Reaction Noted  . Codeine Nausea And Vomiting 10/28/2008    Family History  Problem Relation Age of Onset  . Cancer Father        Lung  . Hypertension Mother   . Coronary artery disease Mother   . Kidney failure Mother   . Cancer Sister        breast carcinoma  . Hypertension Sister   . Colon cancer Neg Hx     Social History   Socioeconomic History  . Marital status: Married    Spouse name: Not on file  . Number of children: 2  . Years of education: Not on file  . Highest education level: Not on file  Occupational History  .  Occupation: retired  Tobacco Use  . Smoking status: Never Smoker  . Smokeless tobacco: Never Used  Vaping Use  . Vaping Use: Never used  Substance and Sexual Activity  . Alcohol use: Not Currently    Comment: on holidays  . Drug use: No  . Sexual activity: Never  Other Topics Concern  . Not on file  Social History Narrative   Retired from [employer] at home with wife   Right handed   Caffeine: none   Social Determinants of Health   Financial Resource Strain:   . Difficulty of Paying Living Expenses: Not on file  Food Insecurity:   . Worried About Charity fundraiser in the Last Year: Not on file  . Ran Out of Food in the Last  Year: Not on file  Transportation Needs:   . Lack of Transportation (Medical): Not on file  . Lack of Transportation (Non-Medical): Not on file  Physical Activity:   . Days of Exercise per Week: Not on file  . Minutes of Exercise per Session: Not on file  Stress:   . Feeling of Stress : Not on file  Social Connections:   . Frequency of Communication with Friends and Family: Not on file  . Frequency of Social Gatherings with Friends and Family: Not on file  . Attends Religious Services: Not on file  . Active Member of Clubs or Organizations: Not on file  . Attends Archivist Meetings: Not on file  . Marital Status: Not on file  Intimate Partner Violence:   . Fear of Current or Ex-Partner: Not on file  . Emotionally Abused: Not on file  . Physically Abused: Not on file  . Sexually Abused: Not on file    Review of Systems: See HPI, otherwise negative ROS  Physical Exam: Pulse 77   Temp 98.9 F (37.2 C)   Resp 14   SpO2 97%  General:   Alert,  Well-developed, well-nourished, pleasant and cooperative in NAD Neck:  Supple; no masses or thyromegaly. No significant cervical adenopathy. Lungs:  Clear throughout to auscultation.   No wheezes, crackles, or rhonchi. No acute distress. Heart:  Regular rate and rhythm;  no murmurs, clicks, rubs,  or gallops. Abdomen: Non-distended, normal bowel sounds.  Soft and nontender without appreciable mass or hepatosplenomegaly.  Pulses:  Normal pulses noted. Extremities:  Without clubbing or edema.  Impression/Plan: 79 year old gentleman with recurrent esophageal dysphagia.  History of colonic adenoma.  Here for EGD with possible esophageal dilation as feasible/appropriate along with a surveillance colonoscopy per plan. The risks, benefits, limitations, imponderables and alternatives regarding both EGD and colonoscopy have been reviewed with the patient. Questions have been answered. All parties agreeable.      Notice: This dictation was prepared with Dragon dictation along with smaller phrase technology. Any transcriptional errors that result from this process are unintentional and may not be corrected upon review.

## 2020-05-23 NOTE — Anesthesia Postprocedure Evaluation (Signed)
Anesthesia Post Note  Patient: Gregory Wilson  Procedure(s) Performed: COLONOSCOPY WITH PROPOFOL (N/A ) ESOPHAGOGASTRODUODENOSCOPY (EGD) WITH PROPOFOL (N/A ) MALONEY DILATION (N/A ) POLYPECTOMY  Patient location during evaluation: PACU Anesthesia Type: General Level of consciousness: awake Pain management: pain level controlled Vital Signs Assessment: post-procedure vital signs reviewed and stable Respiratory status: spontaneous breathing, respiratory function stable and nonlabored ventilation Cardiovascular status: blood pressure returned to baseline Postop Assessment: no apparent nausea or vomiting Anesthetic complications: no   No complications documented.   Last Vitals:  Vitals:   05/23/20 1030 05/23/20 1045  BP: 93/68 113/71  Pulse: 88 79  Resp: 20 16  Temp:    SpO2: 97% 97%    Last Pain:  Vitals:   05/23/20 1045  PainSc: 0-No pain                 Karna Dupes

## 2020-05-23 NOTE — Anesthesia Preprocedure Evaluation (Signed)
Anesthesia Evaluation  Patient identified by MRN, date of birth, ID band Patient awake    Reviewed: Allergy & Precautions, NPO status , Patient's Chart, lab work & pertinent test results, reviewed documented beta blocker date and time   History of Anesthesia Complications (+) PONV and history of anesthetic complications  Airway Mallampati: IV  TM Distance: >3 FB Neck ROM: Full    Dental  (+) Dental Advisory Given, Chipped   Pulmonary shortness of breath and with exertion, sleep apnea ,    breath sounds clear to auscultation       Cardiovascular Exercise Tolerance: Poor hypertension, Pt. on medications and Pt. on home beta blockers + CAD, + Past MI and + Cardiac Stents  Normal cardiovascular exam+ dysrhythmias Atrial Fibrillation  Rhythm:Regular Rate:Normal   H/O RBBB  Cath -2018- 1. Probable anterior STEMI with partially occlusive thrombus within the remotely implanted mid LAD stent, treated successfully with heparin, Aggrastat, and balloon angioplasty 2. Widely patent left main, left circumflex, and right coronary artery with minor irregularities 3. Moderate diffuse disease throughout the mid and distal LAD beyond the stented segment 4. Continued patency of the previously stented first diagonal branch  Recommendations: We'll check an echocardiogram for assessment of LV function. Would continue aspirin and brilinta for 1 year and probably would consider long-term dual antiplatelet therapy with aspirin and clopidogrel after 1 year in this patient who had very late stent thrombosis  Stress test - 2019 - normal    Neuro/Psych  Neuromuscular disease negative psych ROS   GI/Hepatic Neg liver ROS, hiatal hernia, GERD  Medicated and Controlled,  Endo/Other  negative endocrine ROS  Renal/GU negative Renal ROS  negative genitourinary   Musculoskeletal  (+) Arthritis , Spinal stenosis    Abdominal   Peds negative pediatric  ROS (+)  Hematology negative hematology ROS (+)   Anesthesia Other Findings   Reproductive/Obstetrics negative OB ROS                             Anesthesia Physical Anesthesia Plan  ASA: III  Anesthesia Plan: General   Post-op Pain Management:    Induction: Intravenous  PONV Risk Score and Plan: TIVA  Airway Management Planned: Nasal Cannula and Natural Airway  Additional Equipment:   Intra-op Plan:   Post-operative Plan:   Informed Consent: I have reviewed the patients History and Physical, chart, labs and discussed the procedure including the risks, benefits and alternatives for the proposed anesthesia with the patient or authorized representative who has indicated his/her understanding and acceptance.     Dental advisory given  Plan Discussed with: CRNA and Surgeon  Anesthesia Plan Comments:         Anesthesia Quick Evaluation

## 2020-05-23 NOTE — Transfer of Care (Signed)
Immediate Anesthesia Transfer of Care Note  Patient: Gregory Wilson  Procedure(s) Performed: COLONOSCOPY WITH PROPOFOL (N/A ) ESOPHAGOGASTRODUODENOSCOPY (EGD) WITH PROPOFOL (N/A ) MALONEY DILATION (N/A ) POLYPECTOMY  Patient Location: PACU  Anesthesia Type:General  Level of Consciousness: drowsy  Airway & Oxygen Therapy: Patient Spontanous Breathing  Post-op Assessment: Report given to RN  Post vital signs: Reviewed and stable  Last Vitals:  Vitals Value Taken Time  BP 89/61 05/23/20 1025  Temp    Pulse 90 05/23/20 1027  Resp 22 05/23/20 1027  SpO2 96 % 05/23/20 1027  Vitals shown include unvalidated device data.  Last Pain:  Vitals:   05/23/20 0800  PainSc: 0-No pain         Complications: No complications documented.

## 2020-05-23 NOTE — Op Note (Signed)
[facility] Patient Name: Gregory Wilson Procedure Date: 05/23/2020 9:45 AM MRN: [id number] Date of Birth: [birth year] Attending MD: Norvel Richards , MD CSN: [id number] Age: 79 Admit Type: Outpatient Procedure:                Colonoscopy Indications:              High risk colon cancer surveillance: Personal                            history of colonic polyps Providers:                Norvel Richards, MD, Caprice Kluver, Lambert Mody, Randa Spike, Technician Referring MD:              Medicines:                Propofol per Anesthesia Complications:            No immediate complications. Estimated Blood Loss:     Estimated blood loss was minimal. Procedure:                Pre-Anesthesia Assessment:                           - Prior to the procedure, a History and Physical                            was performed, and patient medications and                            allergies were reviewed. The patient's tolerance of                            previous anesthesia was also reviewed. The risks                            and benefits of the procedure and the sedation                            options and risks were discussed with the patient.                            All questions were answered, and informed consent                            was obtained. Prior Anticoagulants: The patient has                            taken no previous anticoagulant or antiplatelet                            agents. ASA Grade Assessment: III - A patient with  severe systemic disease. After reviewing the risks                            and benefits, the patient was deemed in                            satisfactory condition to undergo the procedure.                           After obtaining informed consent, the colonoscope                            was passed under direct vision. Throughout the                             procedure, the patient's blood pressure, pulse, and                            oxygen saturations were monitored continuously. The                            [id number]-[id number] ([id number]) scope was introduced through                            the anus and advanced to the the cecum, identified                            by appendiceal orifice and ileocecal valve. The                            colonoscopy was performed without difficulty. The                            patient tolerated the procedure well. The quality                            of the bowel preparation was adequate. Scope In: 9:50:18 AM Scope Out: 10:13:52 AM Scope Withdrawal Time: 0 hours 15 minutes 19 seconds  Total Procedure Duration: 0 hours 23 minutes 34 seconds  Findings:      The perianal and digital rectal examinations were normal.      Non-bleeding internal hemorrhoids were found during retroflexion. The       hemorrhoids were moderate, large and Grade I (internal hemorrhoids that       do not prolapse).      Scattered medium-mouthed diverticula were found in the sigmoid colon and       descending colon.      Ten semi-pedunculated polyps were found in the descending colon and       hepatic flexure. The polyps were 3 to 7 mm in size. These polyps were       removed with a cold snare. Resection and retrieval were complete.       Estimated blood loss was minimal.      The exam was otherwise without abnormality on direct and retroflexion       views. Impression:               -  Non-bleeding internal hemorrhoids.                           - Diverticulosis in the sigmoid colon and in the                            descending colon.                           - Ten 3 to 7 mm polyps in the descending colon and                            at the hepatic flexure, removed with a cold snare.                            Resected and retrieved.                           - The examination was otherwise normal on direct                             and retroflexion views. Moderate Sedation:      Moderate (conscious) sedation was personally administered by an       anesthesia professional. The following parameters were monitored: oxygen       saturation, heart rate, blood pressure, respiratory rate, EKG, adequacy       of pulmonary ventilation, and response to care. Recommendation:           - Patient has a contact number available for                            emergencies. The signs and symptoms of potential                            delayed complications were discussed with the                            patient. Return to normal activities tomorrow.                            Written discharge instructions were provided to the                            patient.                           - Resume previous diet.                           - Continue present medications.                           - Repeat colonoscopy date to be determined after                            pending pathology results are reviewed  for                            surveillance based on pathology results.                           - Return to GI office (date not yet determined).                            The EGD report. Procedure Code(s):        --- Professional ---                           [phone], Colonoscopy, flexible; with removal of                            tumor(s), polyp(s), or other lesion(s) by snare                            technique Diagnosis Code(s):        --- Professional ---                           Z86.010, Personal history of colonic polyps                           K64.0, First degree hemorrhoids                           K63.5, Polyp of colon                           K57.30, Diverticulosis of large intestine without                            perforation or abscess without bleeding CPT copyright 2019 [employer]. All rights reserved. The codes documented in this report are preliminary and upon coder  review may  be revised to meet current compliance requirements. Cristopher Estimable. Samar Venneman, MD Norvel Richards, MD 05/23/2020 10:21:24 AM This report has been signed electronically. Number of Addenda: 0

## 2020-05-23 NOTE — Op Note (Signed)
[facility] Patient Name: Gregory Wilson Procedure Date: 05/23/2020 9:11 AM MRN: [id number] Date of Birth: Sep 08, 1940 Attending MD: Norvel Richards , MD CSN: [id number] Age: 79 Admit Type: Outpatient Procedure:                Upper GI endoscopy Indications:              Dysphagia Providers:                Norvel Richards, MD, Caprice Kluver, Lambert Mody, Randa Spike, Technician Referring MD:              Medicines:                Propofol per Anesthesia Complications:            No immediate complications. Estimated Blood Loss:     Estimated blood loss: none. Procedure:                Pre-Anesthesia Assessment:                           - Prior to the procedure, a History and Physical                            was performed, and patient medications and                            allergies were reviewed. The patient's tolerance of                            previous anesthesia was also reviewed. The risks                            and benefits of the procedure and the sedation                            options and risks were discussed with the patient.                            All questions were answered, and informed consent                            was obtained. Prior Anticoagulants: The patient has                            taken no previous anticoagulant or antiplatelet                            agents. ASA Grade Assessment: III - A patient with                            severe systemic disease. After reviewing the risks  and benefits, the patient was deemed in                            satisfactory condition to undergo the procedure.                           After obtaining informed consent, the endoscope was                            passed under direct vision. Throughout the                            procedure, the patient's blood pressure, pulse, and                            oxygen  saturations were monitored continuously. The                            [id number]-H190 ([id number]) scope was introduced through the                            mouth, and advanced to the second part of duodenum.                            The upper GI endoscopy was accomplished without                            difficulty. The patient tolerated the procedure                            well. Scope In: 9:36:33 AM Scope Out: 9:43:36 AM Total Procedure Duration: 0 hours 7 minutes 3 seconds  Findings:      Somewhat baggy esophageal body otherwise esophageal mucosa appeared       normal. Tubular esophagus was patent throughout its course. The scope       was withdrawn. Dilation was performed with a Maloney dilator with mild       resistance at 56 Fr. . Dilation was performed with a Maloney dilator       with mild resistance at 50 Fr. The dilation site was examined following       endoscope reinsertion and showed no change. Estimated blood loss: none.      A small hiatal hernia was present. Stomach appeared normal otherwise.      Prominent lymphoid hyperplasia / nodularity of the duodenal bulb       (chronic finding); Otherwise, the bulb and second portion appeared       normal. Impression:               - Normal. Dilated.                           - Small hiatal hernia.                           - Normal duodenal bulb (lymphoid hyperplasia) and  second portion of the duodenum.                           - No specimens collected. Moderate Sedation:      Moderate (conscious) sedation was personally administered by an       anesthesia professional. The following parameters were monitored: oxygen       saturation, heart rate, blood pressure, respiratory rate, EKG, adequacy       of pulmonary ventilation, and response to care. Recommendation:           - Patient has a contact number available for                            emergencies. The signs and symptoms of potential                             delayed complications were discussed with the                            patient. Return to normal activities tomorrow.                            Written discharge instructions were provided to the                            patient.                           - Advance diet as tolerated.                           - Continue present medications.                           - Return to my office (date not yet determined).                            See colonoscopy report Procedure Code(s):        --- Professional ---                           [phone], Esophagogastroduodenoscopy, flexible,                            transoral; diagnostic, including collection of                            specimen(s) by brushing or washing, when performed                            (separate procedure)                           43450, Dilation of esophagus, by unguided sound or                            bougie, single or  multiple passes Diagnosis Code(s):        --- Professional ---                           K44.9, Diaphragmatic hernia without obstruction or                            gangrene                           R13.10, Dysphagia, unspecified CPT copyright 2019 American Medical Association. All rights reserved. The codes documented in this report are preliminary and upon coder review may  be revised to meet current compliance requirements. Cristopher Estimable. Laquanna Veazey, MD Norvel Richards, MD 05/23/2020 10:17:30 AM This report has been signed electronically. Number of Addenda: 0

## 2020-05-23 NOTE — Progress Notes (Signed)
Pt remains in Pre-op, has not gone to ENDO 3 for procedure. Wife notified of pt status. Voiced understanding.

## 2020-05-24 LAB — SURGICAL PATHOLOGY

## 2020-05-25 ENCOUNTER — Encounter (HOSPITAL_COMMUNITY): Payer: Self-pay | Admitting: Internal Medicine

## 2020-05-25 ENCOUNTER — Encounter: Payer: Self-pay | Admitting: Internal Medicine

## 2020-06-07 ENCOUNTER — Telehealth: Payer: Self-pay | Admitting: Internal Medicine

## 2020-06-07 NOTE — Telephone Encounter (Signed)
Spoke with pt. Linzess 145 mcg samples are ready for pickup.

## 2020-06-07 NOTE — Telephone Encounter (Signed)
Pt asking for Linzess samples. [phone]

## 2020-06-13 ENCOUNTER — Telehealth: Payer: Self-pay | Admitting: Internal Medicine

## 2020-06-13 NOTE — Telephone Encounter (Signed)
Spoke with pt. Pt would like to know if his esophagus was stretched during the EGD? Pt is aware that there was a small hiatal hernia and normal appearing stomach.

## 2020-06-13 NOTE — Telephone Encounter (Signed)
Noted. Spoke with pt. Pt is aware.  

## 2020-06-13 NOTE — Telephone Encounter (Signed)
Pt said he received letter about his colonoscopy results from Dr Gala Romney, but nothing about his EGD results. Please call [phone]

## 2020-06-13 NOTE — Telephone Encounter (Signed)
His esophagus was wide open.  However, I dilated it anyway.

## 2020-10-10 DIAGNOSIS — I129 Hypertensive chronic kidney disease with stage 1 through stage 4 chronic kidney disease, or unspecified chronic kidney disease: Secondary | ICD-10-CM | POA: Diagnosis not present

## 2020-10-10 DIAGNOSIS — N1831 Chronic kidney disease, stage 3a: Secondary | ICD-10-CM | POA: Diagnosis not present

## 2020-10-10 DIAGNOSIS — Z6841 Body Mass Index (BMI) 40.0 and over, adult: Secondary | ICD-10-CM | POA: Diagnosis not present

## 2020-10-10 DIAGNOSIS — E1122 Type 2 diabetes mellitus with diabetic chronic kidney disease: Secondary | ICD-10-CM | POA: Diagnosis not present

## 2020-10-10 DIAGNOSIS — E782 Mixed hyperlipidemia: Secondary | ICD-10-CM | POA: Diagnosis not present

## 2020-10-10 DIAGNOSIS — I10 Essential (primary) hypertension: Secondary | ICD-10-CM | POA: Diagnosis not present

## 2020-10-12 ENCOUNTER — Other Ambulatory Visit: Payer: Self-pay | Admitting: Cardiology

## 2020-10-13 DIAGNOSIS — K219 Gastro-esophageal reflux disease without esophagitis: Secondary | ICD-10-CM | POA: Diagnosis not present

## 2020-10-13 DIAGNOSIS — R600 Localized edema: Secondary | ICD-10-CM | POA: Diagnosis not present

## 2020-10-13 DIAGNOSIS — R7301 Impaired fasting glucose: Secondary | ICD-10-CM | POA: Diagnosis not present

## 2020-10-13 DIAGNOSIS — Z6841 Body Mass Index (BMI) 40.0 and over, adult: Secondary | ICD-10-CM | POA: Diagnosis not present

## 2020-10-13 DIAGNOSIS — E1122 Type 2 diabetes mellitus with diabetic chronic kidney disease: Secondary | ICD-10-CM | POA: Diagnosis not present

## 2020-10-13 DIAGNOSIS — K5900 Constipation, unspecified: Secondary | ICD-10-CM | POA: Diagnosis not present

## 2020-10-13 DIAGNOSIS — I252 Old myocardial infarction: Secondary | ICD-10-CM | POA: Diagnosis not present

## 2020-10-13 DIAGNOSIS — I10 Essential (primary) hypertension: Secondary | ICD-10-CM | POA: Diagnosis not present

## 2020-10-13 DIAGNOSIS — E782 Mixed hyperlipidemia: Secondary | ICD-10-CM | POA: Diagnosis not present

## 2020-10-15 NOTE — Progress Notes (Signed)
HPI: FU CAD s/p DES to LAD and BMS to Dx in 2003,PTCA LAD 9/18,HL, HTN, AFlutter (transient on treadmill - declined anticoagulation).Since last seen,he denies dyspnea, chest pain, palpitations or syncope.  Occasional minimal pedal edema.  He can climb 1-2 flights of stairs slowly without chest pain.  His limiting factor is back pain.  Studies: - LHC (10/2001): Mid LAD 70%, Ostial D1 80%, CFX and RCA normal, EF 65%. PCI: Cypher DES to LAD and Express BMS to Dx. -LHC September 2018 showed anterior infarct secondary to partially occlusive thrombus in prior LAD stent successfully treated with heparin, Aggrastat and balloon angioplasty. No other obstructive disease noted. Dual antiplatelet therapy recommended long-term. - EchocardiogramOctober 2020 showed normal LV systolic function, mild left ventricular hypertrophy, mild aortic insufficiency. - Nuclear (11/19): No ischemia, EF 69%,  -Abdominal ultrasound(8/21):Abdominal aortic ectasia measuring 2.7 cm. Follow-up recommended 24 months. -Lower extremity venous Dopplers September 2020negative.  Current Outpatient Medications  Medication Sig Dispense Refill  . amLODipine (NORVASC) 10 MG tablet TAKE 1 TABLET EVERY MORNING (KEEP OFFICE VISIT) 90 tablet 3  . aspirin EC 81 MG EC tablet Take 1 tablet (81 mg total) by mouth daily.    Marland Kitchen atorvastatin (LIPITOR) 80 MG tablet TAKE 1 TABLET EVERY DAY  AT  6  PM (KEEP MD APPOINTMENT) 90 tablet 3  . dapagliflozin propanediol (FARXIGA) 5 MG TABS tablet Take by mouth daily.    Marland Kitchen esomeprazole (NEXIUM) 40 MG capsule Take 40 mg by mouth daily as needed (acid reflux).     . fluticasone (FLONASE) 50 MCG/ACT nasal spray Place 1 spray into both nostrils daily as needed for allergies.     . furosemide (LASIX) 20 MG tablet Take 1 tablet (20 mg total) by mouth daily. 90 tablet 3  . ibuprofen (ADVIL) 800 MG tablet Take 800 mg by mouth every 8 (eight) hours as needed for moderate pain.     Marland Kitchen  irbesartan (AVAPRO) 300 MG tablet TAKE 1 TABLET EVERY DAY (KEEP MD APPOINTMENT) 90 tablet 3  . LINZESS 145 MCG CAPS capsule Take 145 mcg by mouth daily as needed (constipation).     . metoprolol succinate (TOPROL-XL) 100 MG 24 hr tablet TAKE 1 TABLET EVERY DAY (KEEP OFFICE VISIT) 90 tablet 3  . PYRIDOXINE HCL PO Take 2 tablets by mouth daily.    . TURMERIC-GINGER PO Take 2 tablets by mouth daily.     No current facility-administered medications for this visit.     Past Medical History:  Diagnosis Date  . Acute ST elevation myocardial infarction (STEMI) of anterior wall ([place]) 03/07/2017  . Arthritis   . BPH (benign prostatic hypertrophy)   . CAD (coronary artery disease)    a. Lexiscan Myoview (11/2013):  Normal stress nuclear study.  LV Ejection Fraction: 65%  . Complication of anesthesia    hx PONV  . Degenerative joint disease   . GERD (gastroesophageal reflux disease)   . H/O hiatal hernia   . History of atrial flutter    REMOTE HX ON TREADMILL  . History of colon polyps   . Hyperlipidemia   . Hypertension   . IBS (irritable bowel syndrome)   . Lower urinary tract symptoms (LUTS)   . OSA (obstructive sleep apnea) 03/26/2016  . RBBB (right bundle branch block)   . S/P drug eluting coronary stent placement    11-26-2001   X2 DES  TO LAD &  BMS  . Schatzki's ring   . Status post dilation of  esophageal narrowing     Past Surgical History:  Procedure Laterality Date  . APPENDECTOMY    . CARDIOVASCULAR STRESS TEST  12-09-2013   NUCLEAR LEXISCAN STUDY--  RESULTS PENDING  . CARDIOVASCULAR STRESS TEST  04/2011  DR Brissia Delisa   NORMAL /  NO ISCHEMIA/  EF 67%  . CHOLECYSTECTOMY    . COLONOSCOPY  05/29/2011   RMR: tubular adenoma  . COLONOSCOPY WITH PROPOFOL N/A 05/23/2020   Procedure: COLONOSCOPY WITH PROPOFOL;  Surgeon: Daneil Dolin, MD;  [facility];  Service: Endoscopy;  Laterality: N/A;  9:00am  . CORONARY ANGIOPLASTY WITH STENT PLACEMENT  11-26-2001  DR Eustace Quail   DES (Cypher) to LAD 70%/  BMS (Express) to OSTIAL D1 80%/   CFX  &  RCA normal/  EF 65%  . CORONARY/GRAFT ACUTE MI REVASCULARIZATION N/A 03/07/2017   Procedure: Coronary/Graft Acute MI Revascularization;  Surgeon: Sherren Mocha, MD;  Location: [place] [facility];  Service: Cardiovascular;  Laterality: N/A;  . ESOPHAGOGASTRODUODENOSCOPY  04/05/2004   RMR: Small hiatal hernia/Questionable subtle Schatzki's ring, status post dilation as described  . ESOPHAGOGASTRODUODENOSCOPY  05/2011   RMR: small hiatal hernia, empiric dilation with 22F and 69F Maloney  . ESOPHAGOGASTRODUODENOSCOPY (EGD) WITH ESOPHAGEAL DILATION N/A 01/16/2013   Dr. Rourk:baggy somewhat atonic esophagus-query underlying esophageal motility disorder. Status post passage of a Maloney dilator (large bore) empirically. Status post esophageal biopsy/Reflux symptoms are well controlled on Nexium.benign path  . ESOPHAGOGASTRODUODENOSCOPY (EGD) WITH PROPOFOL N/A 05/23/2020   Procedure: ESOPHAGOGASTRODUODENOSCOPY (EGD) WITH PROPOFOL;  Surgeon: Daneil Dolin, MD;  [facility];  Service: Endoscopy;  Laterality: N/A;  . LEFT HEART CATH AND CORONARY ANGIOGRAPHY N/A 03/07/2017   Procedure: LEFT HEART CATH AND CORONARY ANGIOGRAPHY;  Surgeon: Sherren Mocha, MD;  Location: [place] [facility];  Service: Cardiovascular;  Laterality: N/A;  . Venia Minks DILATION N/A 05/23/2020   Procedure: Venia Minks DILATION;  Surgeon: Daneil Dolin, MD;  [facility];  Service: Endoscopy;  Laterality: N/A;  . POLYPECTOMY  05/23/2020   Procedure: POLYPECTOMY;  Surgeon: Daneil Dolin, MD;  [facility];  Service: Endoscopy;;  . TOTAL HIP ARTHROPLASTY Left 09-07-2003  . TOTAL KNEE ARTHROPLASTY Left 07-27-2002  . TRANSURETHRAL RESECTION OF PROSTATE N/A 12/21/2013   Procedure: TRANSURETHRAL RESECTION OF THE PROSTATE WITH GYRUS INSTRUMENTS;  Surgeon: Jorja Loa, MD;  Location: [facility];  Service: Urology;   Laterality: N/A;    Social History   Socioeconomic History  . Marital status: Married    Spouse name: Not on file  . Number of children: 2  . Years of education: Not on file  . Highest education level: Not on file  Occupational History  . Occupation: retired  Tobacco Use  . Smoking status: Never Smoker  . Smokeless tobacco: Never Used  Vaping Use  . Vaping Use: Never used  Substance and Sexual Activity  . Alcohol use: Not Currently    Comment: on holidays  . Drug use: No  . Sexual activity: Never  Other Topics Concern  . Not on file  Social History Narrative   Retired from [employer] at home with wife   Right handed   Caffeine: none   Social Determinants of Health   Financial Resource Strain: Not on file  Food Insecurity: Not on file  Transportation Needs: Not on file  Physical Activity: Not on file  Stress: Not on file  Social Connections: Not on file  Intimate Partner Violence: Not on file    Family History  Problem Relation Age of Onset  . Cancer Father        Lung  . Hypertension Mother   . Coronary artery disease Mother   . Kidney failure Mother   . Cancer Sister        breast carcinoma  . Hypertension Sister   . Colon cancer Neg Hx     ROS: Back pain but no fevers or chills, productive cough, hemoptysis, dysphasia, odynophagia, melena, hematochezia, dysuria, hematuria, rash, seizure activity, orthopnea, PND, pedal edema, claudication. Remaining systems are negative.  Physical Exam: Well-developed well-nourished in no acute distress.  Skin is warm and dry.  HEENT is normal.  Neck is supple.  Chest is clear to auscultation with normal expansion.  Cardiovascular exam is regular rate and rhythm.  Abdominal exam nontender or distended. No masses palpated. Extremities show trace edema. neuro grossly intact  A/P  1-patient denies chest pain.  Plan to continue aspirin and statin.  2 abdominal aortic ectasia--plan follow-up  ultrasound August 2023.  3 hypertension-blood pressure controlled today.  Continue present medical regimen.  4 hyperlipidemia-continue present medications.  5 history of transient atrial flutter at time of previous exercise treadmill-patient remains in sinus rhythm.  Continue beta-blocker at present dose.  He previously declined anticoagulation understanding the higher risk of CVA.  6 lower extremity edema-continue Lasix.  Check potassium and renal function.  Kirk Ruths, MD

## 2020-10-20 ENCOUNTER — Encounter: Payer: Self-pay | Admitting: Cardiology

## 2020-10-20 ENCOUNTER — Other Ambulatory Visit: Payer: Self-pay

## 2020-10-20 ENCOUNTER — Ambulatory Visit (INDEPENDENT_AMBULATORY_CARE_PROVIDER_SITE_OTHER): Payer: Medicare PPO | Admitting: Cardiology

## 2020-10-20 VITALS — BP 118/82 | HR 62 | Ht 73.5 in | Wt 319.4 lb

## 2020-10-20 DIAGNOSIS — I714 Abdominal aortic aneurysm, without rupture, unspecified: Secondary | ICD-10-CM

## 2020-10-20 DIAGNOSIS — I2510 Atherosclerotic heart disease of native coronary artery without angina pectoris: Secondary | ICD-10-CM

## 2020-10-20 DIAGNOSIS — I483 Typical atrial flutter: Secondary | ICD-10-CM | POA: Diagnosis not present

## 2020-10-20 DIAGNOSIS — I10 Essential (primary) hypertension: Secondary | ICD-10-CM

## 2020-10-20 DIAGNOSIS — E785 Hyperlipidemia, unspecified: Secondary | ICD-10-CM

## 2020-10-20 NOTE — Patient Instructions (Signed)

## 2020-11-07 ENCOUNTER — Telehealth: Payer: Self-pay | Admitting: Internal Medicine

## 2020-11-07 DIAGNOSIS — E1122 Type 2 diabetes mellitus with diabetic chronic kidney disease: Secondary | ICD-10-CM | POA: Diagnosis not present

## 2020-11-07 DIAGNOSIS — R309 Painful micturition, unspecified: Secondary | ICD-10-CM | POA: Diagnosis not present

## 2020-11-07 DIAGNOSIS — J309 Allergic rhinitis, unspecified: Secondary | ICD-10-CM | POA: Diagnosis not present

## 2020-11-07 NOTE — Telephone Encounter (Signed)
Left 3 boxes of Linzess 145 mcg up front for pt to pick up.  Pt aware and will pick up some time today.

## 2020-11-07 NOTE — Telephone Encounter (Signed)
This RN attempted to call patient at [phone], call went to voice mail, this RN left non-specific message for patient to call office back. MyChart message from patient to be sent to [facility] and Hilda Blades, Therapist, sports.

## 2020-11-07 NOTE — Telephone Encounter (Signed)
Pt called to see if he could get samples of Linzess. Please call [phone]

## 2020-11-07 NOTE — Telephone Encounter (Signed)
ERROR. DISREGARD THIS.

## 2020-11-07 NOTE — Telephone Encounter (Signed)
Pt needs to speak with nurse. Please call [phone]

## 2021-01-10 ENCOUNTER — Other Ambulatory Visit: Payer: Self-pay | Admitting: Cardiology

## 2021-01-10 DIAGNOSIS — R609 Edema, unspecified: Secondary | ICD-10-CM

## 2021-01-20 ENCOUNTER — Other Ambulatory Visit: Payer: Self-pay | Admitting: Cardiology

## 2021-01-31 DIAGNOSIS — N400 Enlarged prostate without lower urinary tract symptoms: Secondary | ICD-10-CM | POA: Diagnosis not present

## 2021-01-31 DIAGNOSIS — N411 Chronic prostatitis: Secondary | ICD-10-CM | POA: Diagnosis not present

## 2021-02-23 DIAGNOSIS — E1122 Type 2 diabetes mellitus with diabetic chronic kidney disease: Secondary | ICD-10-CM | POA: Diagnosis not present

## 2021-02-23 DIAGNOSIS — I10 Essential (primary) hypertension: Secondary | ICD-10-CM | POA: Diagnosis not present

## 2021-03-01 ENCOUNTER — Telehealth: Payer: Self-pay | Admitting: Internal Medicine

## 2021-03-01 NOTE — Telephone Encounter (Signed)
[phone] patient called asking for linzess samples

## 2021-03-01 NOTE — Telephone Encounter (Signed)
Pt was made aware and stated that he would pick the samples up in the morning.

## 2021-03-01 NOTE — Telephone Encounter (Signed)
Pt is requesting samples of Linzess 145 mcg.

## 2021-03-01 NOTE — Telephone Encounter (Signed)
Can give #3 boxes.

## 2021-03-02 DIAGNOSIS — E1122 Type 2 diabetes mellitus with diabetic chronic kidney disease: Secondary | ICD-10-CM | POA: Diagnosis not present

## 2021-03-02 DIAGNOSIS — M545 Low back pain, unspecified: Secondary | ICD-10-CM | POA: Diagnosis not present

## 2021-03-02 DIAGNOSIS — N1831 Chronic kidney disease, stage 3a: Secondary | ICD-10-CM | POA: Diagnosis not present

## 2021-03-02 DIAGNOSIS — I252 Old myocardial infarction: Secondary | ICD-10-CM | POA: Diagnosis not present

## 2021-03-02 DIAGNOSIS — E782 Mixed hyperlipidemia: Secondary | ICD-10-CM | POA: Diagnosis not present

## 2021-03-02 DIAGNOSIS — Z955 Presence of coronary angioplasty implant and graft: Secondary | ICD-10-CM | POA: Diagnosis not present

## 2021-03-02 DIAGNOSIS — K219 Gastro-esophageal reflux disease without esophagitis: Secondary | ICD-10-CM | POA: Diagnosis not present

## 2021-03-02 DIAGNOSIS — I10 Essential (primary) hypertension: Secondary | ICD-10-CM | POA: Diagnosis not present

## 2021-03-02 DIAGNOSIS — Z0001 Encounter for general adult medical examination with abnormal findings: Secondary | ICD-10-CM | POA: Diagnosis not present

## 2021-03-16 DIAGNOSIS — M25561 Pain in right knee: Secondary | ICD-10-CM | POA: Diagnosis not present

## 2021-03-16 DIAGNOSIS — M25551 Pain in right hip: Secondary | ICD-10-CM | POA: Diagnosis not present

## 2021-04-05 DIAGNOSIS — M25551 Pain in right hip: Secondary | ICD-10-CM | POA: Diagnosis not present

## 2021-04-27 ENCOUNTER — Encounter: Payer: Self-pay | Admitting: *Deleted

## 2021-04-28 NOTE — Progress Notes (Signed)
And     HPI: FU CAD s/p DES to LAD and BMS to Dx in 2003, PTCA LAD 9/18, HL, HTN, AFlutter (transient on treadmill - declined anticoagulation). Since last seen, patient denies increased dyspnea, chest pain, palpitations, syncope or pedal edema.  Approximately 2 weeks ago he had transient left upper extremity/left chest.  After 1 hour symptoms resolved.  Note he did not have weakness in his extremities or dysarthria.    Studies:  - LHC (10/2001):  Mid LAD 70%, Ostial D1 80%, CFX and RCA normal, EF 65%.  PCI:  Cypher DES to LAD and Express BMS to Dx.   -   LHC September 2018 showed anterior infarct secondary to partially occlusive thrombus in prior LAD stent successfully treated with heparin, Aggrastat and balloon angioplasty. No other obstructive disease noted. Dual antiplatelet therapy recommended long-term.  - Echocardiogram October 2020 showed normal LV systolic function, mild left ventricular hypertrophy, mild aortic insufficiency.  - Nuclear (11/19):  No ischemia, EF 69%,   - Abdominal ultrasound (8/21): Abdominal aortic ectasia measuring 2.7 cm.   Follow-up recommended 24 months.  - Lower extremity venous Dopplers September 2020 negative.  Current Outpatient Medications  Medication Sig Dispense Refill   amLODipine (NORVASC) 10 MG tablet TAKE 1 TABLET EVERY MORNING (KEEP OFFICE VISIT) 90 tablet 3   aspirin EC 81 MG EC tablet Take 1 tablet (81 mg total) by mouth daily.     atorvastatin (LIPITOR) 80 MG tablet TAKE 1 TABLET EVERY DAY  AT  6PM (KEEP MD APPOINTMENT) 90 tablet 3   dapagliflozin propanediol (FARXIGA) 5 MG TABS tablet Take by mouth daily.     esomeprazole (NEXIUM) 40 MG capsule Take 40 mg by mouth daily as needed (acid reflux).      fluticasone (FLONASE) 50 MCG/ACT nasal spray Place 1 spray into both nostrils daily as needed for allergies.      furosemide (LASIX) 20 MG tablet TAKE 1 TABLET(20 MG) BY MOUTH DAILY 90 tablet 3   ibuprofen (ADVIL) 800 MG tablet Take 800 mg by mouth  every 8 (eight) hours as needed for moderate pain.      irbesartan (AVAPRO) 300 MG tablet TAKE 1 TABLET EVERY DAY (KEEP MD APPOINTMENT) 90 tablet 3   LINZESS 145 MCG CAPS capsule Take 145 mcg by mouth daily as needed (constipation).      metoprolol succinate (TOPROL-XL) 100 MG 24 hr tablet TAKE 1 TABLET EVERY DAY (KEEP OFFICE VISIT) 90 tablet 3   PYRIDOXINE HCL PO Take 2 tablets by mouth daily.     TURMERIC-GINGER PO Take 2 tablets by mouth daily.     No current facility-administered medications for this visit.     Past Medical History:  Diagnosis Date   Acute ST elevation myocardial infarction (STEMI) of anterior wall ([place]) 03/07/2017   Arthritis    BPH (benign prostatic hypertrophy)    CAD (coronary artery disease)    a. Lexiscan Myoview (11/2013):  Normal stress nuclear study.  LV Ejection Fraction: 24%   Complication of anesthesia    hx PONV   Degenerative joint disease    GERD (gastroesophageal reflux disease)    H/O hiatal hernia    History of atrial flutter    REMOTE HX ON TREADMILL   History of colon polyps    Hyperlipidemia    Hypertension    IBS (irritable bowel syndrome)    Lower urinary tract symptoms (LUTS)    OSA (obstructive sleep apnea) 03/26/2016   RBBB (right bundle branch block)  S/P drug eluting coronary stent placement    11-26-2001   X2 DES  TO LAD &  BMS   Schatzki's ring    Status post dilation of esophageal narrowing     Past Surgical History:  Procedure Laterality Date   APPENDECTOMY     CARDIOVASCULAR STRESS TEST  12-09-2013   NUCLEAR LEXISCAN STUDY--  RESULTS PENDING   CARDIOVASCULAR STRESS TEST  04/2011  DR Kaisyn Reinhold   NORMAL /  NO ISCHEMIA/  EF 67%   CHOLECYSTECTOMY     COLONOSCOPY  05/29/2011   RMR: tubular adenoma   COLONOSCOPY WITH PROPOFOL N/A 05/23/2020   Procedure: COLONOSCOPY WITH PROPOFOL;  Surgeon: Daneil Dolin, MD;  [facility];  Service: Endoscopy;  Laterality: N/A;  9:00am   CORONARY ANGIOPLASTY WITH STENT  PLACEMENT  11-26-2001  DR Eustace Quail   DES (Cypher) to LAD 70%/  BMS (Express) to OSTIAL D1 80%/   CFX  &  RCA normal/  EF 65%   CORONARY/GRAFT ACUTE MI REVASCULARIZATION N/A 03/07/2017   Procedure: Coronary/Graft Acute MI Revascularization;  Surgeon: Sherren Mocha, MD;  Location: [place] [facility];  Service: Cardiovascular;  Laterality: N/A;   ESOPHAGOGASTRODUODENOSCOPY  04/05/2004   RMR: Small hiatal hernia/Questionable subtle Schatzki's ring, status post dilation as described   ESOPHAGOGASTRODUODENOSCOPY  05/2011   RMR: small hiatal hernia, empiric dilation with 21F and 63F Maloney   ESOPHAGOGASTRODUODENOSCOPY (EGD) WITH ESOPHAGEAL DILATION N/A 01/16/2013   Dr. Rourk:baggy somewhat atonic esophagus-query underlying esophageal motility disorder. Status post passage of a Maloney dilator (large bore) empirically. Status post esophageal biopsy/Reflux symptoms are well controlled on Nexium.benign path   ESOPHAGOGASTRODUODENOSCOPY (EGD) WITH PROPOFOL N/A 05/23/2020   Procedure: ESOPHAGOGASTRODUODENOSCOPY (EGD) WITH PROPOFOL;  Surgeon: Daneil Dolin, MD;  [facility];  Service: Endoscopy;  Laterality: N/A;   LEFT HEART CATH AND CORONARY ANGIOGRAPHY N/A 03/07/2017   Procedure: LEFT HEART CATH AND CORONARY ANGIOGRAPHY;  Surgeon: Sherren Mocha, MD;  Location: [place] [facility];  Service: Cardiovascular;  Laterality: N/A;   MALONEY DILATION N/A 05/23/2020   Procedure: Venia Minks DILATION;  Surgeon: Daneil Dolin, MD;  [facility];  Service: Endoscopy;  Laterality: N/A;   POLYPECTOMY  05/23/2020   Procedure: POLYPECTOMY;  Surgeon: Daneil Dolin, MD;  [facility];  Service: Endoscopy;;   TOTAL HIP ARTHROPLASTY Left 09-07-2003   TOTAL KNEE ARTHROPLASTY Left 07-27-2002   TRANSURETHRAL RESECTION OF PROSTATE N/A 12/21/2013   Procedure: TRANSURETHRAL RESECTION OF THE PROSTATE WITH GYRUS INSTRUMENTS;  Surgeon: Jorja Loa, MD;  [facility];  Service: Urology;  Laterality: N/A;    Social History   Socioeconomic History   Marital status: Married    Spouse name: Not on file   Number of children: 2   Years of education: Not on file   Highest education level: Not on file  Occupational History   Occupation: retired  Tobacco Use   Smoking status: Never   Smokeless tobacco: Never  Vaping Use   Vaping Use: Never used  Substance and Sexual Activity   Alcohol use: Not Currently    Comment: on holidays   Drug use: No   Sexual activity: Never  Other Topics Concern   Not on file  Social History Narrative   Retired from [employer] at home with wife   Right handed   Caffeine: none   Social Determinants of Health   Financial Resource Strain: Not on file  Food Insecurity: Not on file  Transportation Needs: Not on file  Physical Activity: Not on file  Stress: Not on file  Social Connections: Not on file  Intimate Partner Violence: Not on file    Family History  Problem Relation Age of Onset   Cancer Father        Lung   Hypertension Mother    Coronary artery disease Mother    Kidney failure Mother    Cancer Sister        breast carcinoma   Hypertension Sister    Colon cancer Neg Hx     ROS: no fevers or chills, productive cough, hemoptysis, dysphasia, odynophagia, melena, hematochezia, dysuria, hematuria, rash, seizure activity, orthopnea, PND, pedal edema, claudication. Remaining systems are negative.  Physical Exam: Well-developed well-nourished in no acute distress.  Skin is warm and dry.  HEENT is normal.  Neck is supple.  Chest is clear to auscultation with normal expansion.  Cardiovascular exam is regular rate and rhythm.  Abdominal exam nontender or distended. No masses palpated. Extremities show trace edema. neuro grossly intact  ECG-normal sinus rhythm at a rate of 61, right bundle branch block.  Personally reviewed  A/P  1 coronary artery disease-patient denies  chest pain.  Plan to continue medical therapy with aspirin and statin.  2 history of abdominal aortic ectasia-follow-up ultrasound August 2023.  3 hypertension-blood pressure controlled.  Continue present medical regimen and follow.  4 hyperlipidemia-continue Lipitor.  5 history of transient atrial flutter during previous exercise treadmill-we will continue beta-blocker.  He has declined anticoagulation in the past.  6 lower extremity edema-continue diuretic at present dose.  7 recent transient numbness and left upper extremity and face-question TIA.  Will arrange echocardiogram to rule out source of embolus and carotid Dopplers.  Kirk Ruths, MD

## 2021-05-01 ENCOUNTER — Encounter: Payer: Self-pay | Admitting: Cardiology

## 2021-05-01 ENCOUNTER — Other Ambulatory Visit: Payer: Self-pay

## 2021-05-01 ENCOUNTER — Ambulatory Visit (INDEPENDENT_AMBULATORY_CARE_PROVIDER_SITE_OTHER): Payer: Medicare PPO | Admitting: Cardiology

## 2021-05-01 VITALS — BP 132/78 | HR 64 | Ht 73.0 in | Wt 313.0 lb

## 2021-05-01 DIAGNOSIS — I10 Essential (primary) hypertension: Secondary | ICD-10-CM

## 2021-05-01 DIAGNOSIS — I714 Abdominal aortic aneurysm, without rupture, unspecified: Secondary | ICD-10-CM | POA: Diagnosis not present

## 2021-05-01 DIAGNOSIS — I2510 Atherosclerotic heart disease of native coronary artery without angina pectoris: Secondary | ICD-10-CM | POA: Diagnosis not present

## 2021-05-01 DIAGNOSIS — G459 Transient cerebral ischemic attack, unspecified: Secondary | ICD-10-CM

## 2021-05-01 DIAGNOSIS — E785 Hyperlipidemia, unspecified: Secondary | ICD-10-CM | POA: Diagnosis not present

## 2021-05-01 DIAGNOSIS — R600 Localized edema: Secondary | ICD-10-CM | POA: Diagnosis not present

## 2021-05-01 DIAGNOSIS — R200 Anesthesia of skin: Secondary | ICD-10-CM

## 2021-05-01 NOTE — Patient Instructions (Signed)
  Testing/Procedures:  Your physician has requested that you have an echocardiogram. Echocardiography is a painless test that uses sound waves to create images of your heart. It provides your doctor with information about the size and shape of your heart and how well your heart's chambers and valves are working. This procedure takes approximately one hour. There are no restrictions for this procedure. Gregory Wilson has requested that you have a carotid duplex. This test is an ultrasound of the carotid arteries in your neck. It looks at blood flow through these arteries that supply the brain with blood. Allow one hour for this exam. There are no restrictions or special instructions. [facility]   Follow-Up: At [facility], you and your health needs are our priority.  As part of our continuing mission to provide you with exceptional heart care, we have created designated Provider Care Teams.  These Care Teams include your primary Cardiologist (physician) and Advanced Practice Providers (APPs -  Physician Assistants and Nurse Practitioners) who all work together to provide you with the care you need, when you need it.  We recommend signing up for the patient portal called "MyChart".  Sign up information is provided on this After Visit Summary.  MyChart is used to connect with patients for Virtual Visits (Telemedicine).  Patients are able to view lab/test results, encounter notes, upcoming appointments, etc.  Non-urgent messages can be sent to your provider as well.   To learn more about what you can do with MyChart, go to [email or web address].    Your next appointment:   6 month(s)  The format for your next appointment:   In Person  Provider:   Kirk Ruths, MD

## 2021-05-02 ENCOUNTER — Ambulatory Visit: Payer: Medicare PPO | Admitting: Cardiology

## 2021-05-17 ENCOUNTER — Other Ambulatory Visit: Payer: Self-pay

## 2021-05-17 ENCOUNTER — Ambulatory Visit (HOSPITAL_COMMUNITY)
Admission: RE | Admit: 2021-05-17 | Discharge: 2021-05-17 | Disposition: A | Discharge status: Home / Self Care | Payer: Medicare PPO | Source: Ambulatory Visit | Attending: Cardiovascular Disease | Admitting: Cardiovascular Disease

## 2021-05-17 DIAGNOSIS — G459 Transient cerebral ischemic attack, unspecified: Secondary | ICD-10-CM

## 2021-05-28 ENCOUNTER — Encounter: Payer: Self-pay | Admitting: Gastroenterology

## 2021-05-28 NOTE — Progress Notes (Signed)
Referring Provider: Celene Squibb, MD Primary Care Physician:  Celene Squibb, MD Primary GI Physician: Dr. Gala Romney  Chief Complaint  Patient presents with   Nausea    W/ gagging, comes/goes, better than when he called for appt   Gas   Dysphagia    None currently     HPI:   Gregory Wilson is a 80 y.o. male with GI history of adenomatous colon polyps due for surveillance in November 2024 if health presents, chronic constipation, GERD, dysphagia with empiric dilations previously, presenting today with chief complaint of nausea with gagging.  Last seen in our office August 2021.  He was using Linzess for constipation with adequate control though difficult to afford. Rare heartburn, using Nexium as needed.  Also reported occasional solid food dysphagia. He was scheduled for surveillance colonoscopy, Linzess and Nexium continued, and also scheduled for EGD at patient's request in the setting of chronic GERD, intermittent dysphagia, and prior need for recurrent dilation.   Colonoscopy 05/23/2020: Nonbleeding internal hemorrhoids, diverticulosis in sigmoid and descending colon, 10 polyps removed.Pathology revealed tubular adenomas.  Recommended repeat colonoscopy in 3 years if health permits.  EGD 05/23/2020: Somewhat baggy esophageal body, otherwise normal esophagus s/p empiric dilation, small hiatal hernia, otherwise normal exam.   Barium esophagram 2005: Mild diffuse impairment of esophageal motility.   Today: Nausea with gagging started 1 week. Was 1-2 times a day.  Improved by drinking cold water or taking nexium.  No identified triggers.  Denies association with meals.  Symptoms typically occurred in the morning. No abdominal pain. Little tenderness in the epigastric area which is chronic without change. No blood in the stools or black stools.  Since nausea started, he has been taking Nexium 20 or 40 mg daily, not at any particular time, but has noticed improvement in his symptoms  already.  Had a little nausea this morning which was the first time since Friday.  He has not taken Nexium yet.    Does not eat within 3 hours of going to bed.  He does consume spicy, fried, fatty foods frequently.  Frequently adds hot sauce.  No regular soda or caffeinated beverages.  Previously taking Nexium as needed. Maybe once every 3-4 days.   No recent medication changes.  Takes aspirin. Used to take ibuprofen, but none since March. Taking tramadol as needed. No alcohol in about 1 year. Typically has a drink around Christmas.    Has a lot of sinus drainage. Some increase in the last couple weeks.   No dysphagia.    Weight is stable. No change in bowel habits. Uses Linzess 145 mcg prn which controls his symptoms well.  He would like to have some samples.  Past Medical History:  Diagnosis Date   Acute ST elevation myocardial infarction (STEMI) of anterior wall ([place]) 03/07/2017   Arthritis    BPH (benign prostatic hypertrophy)    CAD (coronary artery disease)    a. Lexiscan Myoview (11/2013):  Normal stress nuclear study.  LV Ejection Fraction: 37%   Complication of anesthesia    hx PONV   Degenerative joint disease    GERD (gastroesophageal reflux disease)    H/O hiatal hernia    History of atrial flutter    REMOTE HX ON TREADMILL   History of colon polyps    Hyperlipidemia    Hypertension    IBS (irritable bowel syndrome)    Lower urinary tract symptoms (LUTS)    OSA (obstructive sleep apnea) 03/26/2016  RBBB (right bundle branch block)    S/P drug eluting coronary stent placement    11-26-2001   X2 DES  TO LAD &  BMS   Schatzki's ring    Status post dilation of esophageal narrowing     Past Surgical History:  Procedure Laterality Date   APPENDECTOMY     CARDIOVASCULAR STRESS TEST  12/09/2013   NUCLEAR LEXISCAN STUDY--  RESULTS PENDING   CARDIOVASCULAR STRESS TEST  04/2011  DR CRENSHAW   NORMAL /  NO ISCHEMIA/  EF 67%   CHOLECYSTECTOMY     COLONOSCOPY  05/29/2011    RMR: tubular adenoma   COLONOSCOPY WITH PROPOFOL N/A 05/23/2020   ;  Surgeon: Daneil Dolin, MD;  Nonbleeding internal hemorrhoids, diverticulosis in sigmoid and descending colon, 10 polyps removed.Pathology revealed tubular adenomas.  Recommended repeat colonoscopy in 3 years if health permits.   CORONARY ANGIOPLASTY WITH STENT PLACEMENT  11-26-2001  DR Eustace Quail   DES ([facility]) to LAD 70%/  BMS ([facility]) to OSTIAL D1 80%/   CFX  &  RCA normal/  EF 65%   CORONARY/GRAFT ACUTE MI REVASCULARIZATION N/A 03/07/2017   Procedure: Coronary/Graft Acute MI Revascularization;  Surgeon: Sherren Mocha, MD;  Location: [place] [facility];  Service: Cardiovascular;  Laterality: N/A;   ESOPHAGOGASTRODUODENOSCOPY  04/05/2004   RMR: Small hiatal hernia/Questionable subtle Schatzki's ring, status post dilation as described   ESOPHAGOGASTRODUODENOSCOPY  05/2011   RMR: small hiatal hernia, empiric dilation with 32F and 28F Maloney   ESOPHAGOGASTRODUODENOSCOPY (EGD) WITH ESOPHAGEAL DILATION N/A 01/16/2013   Dr. Rourk:baggy somewhat atonic esophagus-query underlying esophageal motility disorder. Status post passage of a Maloney dilator (large bore) empirically. Status post esophageal biopsy/Reflux symptoms are well controlled on Nexium.benign path   ESOPHAGOGASTRODUODENOSCOPY (EGD) WITH PROPOFOL N/A 05/23/2020   Surgeon: Daneil Dolin, MD; Somewhat baggy esophageal body, otherwise normal esophagus s/p empiric dilation, small hiatal hernia, otherwise normal exam.   LEFT HEART CATH AND CORONARY ANGIOGRAPHY N/A 03/07/2017   Procedure: LEFT HEART CATH AND CORONARY ANGIOGRAPHY;  Surgeon: Sherren Mocha, MD;  Location: [place] [facility];  Service: Cardiovascular;  Laterality: N/A;   MALONEY DILATION N/A 05/23/2020   Procedure: Venia Minks DILATION;  Surgeon: Daneil Dolin, MD;  [facility];  Service: Endoscopy;  Laterality: N/A;   POLYPECTOMY  05/23/2020   Procedure: POLYPECTOMY;  Surgeon: Daneil Dolin, MD;  [facility];  Service: Endoscopy;;   TOTAL HIP ARTHROPLASTY Left 09/07/2003   TOTAL KNEE ARTHROPLASTY Left 07/27/2002   TRANSURETHRAL RESECTION OF PROSTATE N/A 12/21/2013   Procedure: TRANSURETHRAL RESECTION OF THE PROSTATE WITH GYRUS INSTRUMENTS;  Surgeon: Jorja Loa, MD;  Location: [facility];  Service: Urology;  Laterality: N/A;    Current Outpatient Medications  Medication Sig Dispense Refill   amLODipine (NORVASC) 10 MG tablet TAKE 1 TABLET EVERY MORNING (KEEP OFFICE VISIT) 90 tablet 3   aspirin EC 81 MG EC tablet Take 1 tablet (81 mg total) by mouth daily.     atorvastatin (LIPITOR) 80 MG tablet TAKE 1 TABLET EVERY DAY  AT  6PM (KEEP MD APPOINTMENT) 90 tablet 3   dapagliflozin propanediol (FARXIGA) 5 MG TABS tablet Take by mouth daily.     esomeprazole (NEXIUM) 40 MG capsule Take 1 capsule (40 mg total) by mouth daily before breakfast. 30 capsule 3   fluticasone (FLONASE) 50 MCG/ACT nasal spray Place 1 spray into both nostrils daily as needed for allergies.      furosemide (LASIX)  20 MG tablet TAKE 1 TABLET(20 MG) BY MOUTH DAILY 90 tablet 3   ibuprofen (ADVIL) 800 MG tablet Take 800 mg by mouth every 8 (eight) hours as needed for moderate pain.      irbesartan (AVAPRO) 300 MG tablet TAKE 1 TABLET EVERY DAY (KEEP MD APPOINTMENT) 90 tablet 3   LINZESS 145 MCG CAPS capsule Take 145 mcg by mouth daily as needed (constipation).      metoprolol succinate (TOPROL-XL) 100 MG 24 hr tablet TAKE 1 TABLET EVERY DAY (KEEP OFFICE VISIT) 90 tablet 3   PYRIDOXINE HCL PO Take 2 tablets by mouth daily.     TURMERIC-GINGER PO Take 2 tablets by mouth daily.     No current facility-administered medications for this visit.    Allergies as of 05/29/2021 - Review Complete 05/29/2021  Allergen Reaction Noted   Codeine Nausea And Vomiting 10/28/2008    Family History  Problem Relation Age of Onset   Cancer Father        Lung   Hypertension Mother     Coronary artery disease Mother    Kidney failure Mother    Cancer Sister        breast carcinoma   Hypertension Sister    Colon cancer Neg Hx     Social History   Socioeconomic History   Marital status: Married    Spouse name: Not on file   Number of children: 2   Years of education: Not on file   Highest education level: Not on file  Occupational History   Occupation: retired  Tobacco Use   Smoking status: Never   Smokeless tobacco: Never  Vaping Use   Vaping Use: Never used  Substance and Sexual Activity   Alcohol use: Not Currently    Comment: on holidays   Drug use: No   Sexual activity: Never  Other Topics Concern   Not on file  Social History Narrative   Retired from [employer] at home with wife   Right handed   Caffeine: none   Social Determinants of Health   Financial Resource Strain: Not on file  Food Insecurity: Not on file  Transportation Needs: Not on file  Physical Activity: Not on file  Stress: Not on file  Social Connections: Not on file    Review of Systems: Gen: Denies fever, chills, cold or flulike symptoms, presyncope, syncope. CV: Denies chest pain, palpitations. Resp: Denies dyspnea or cough. GI: See HPI  Heme: See HPI  Physical Exam: BP (!) 148/86   Pulse 83   Temp (!) 97.1 F (36.2 C)   Ht 6' (1.829 m)   Wt (!) 314 lb (142.4 kg)   BMI 42.59 kg/m  General:   Alert and oriented. No distress noted. Pleasant and cooperative.  Head:  Normocephalic and atraumatic. Eyes:  Conjuctiva clear without scleral icterus. Heart:  S1, S2 present without murmurs appreciated. Lungs:  Clear to auscultation bilaterally. No wheezes, rales, or rhonchi. No distress.  Abdomen:  +BS, soft, and non-distended.  Minimal tenderness in the epigastric area.  No rebound or guarding. No HSM or masses noted. Msk:  Symmetrical without gross deformities. Normal posture. Extremities:  Without edema. Neurologic:  Alert and  oriented x4 Psych:  Normal mood and affect.    Assessment: 80 year old male with GI history of adenomatous colon polyps due for surveillance in November 2024 if health permits, chronic constipation, GERD, dysphagia with empiric dilations previously, presenting today with chief complaint of nausea with  gagging.  Symptoms started about 1 week ago without identified trigger, typically occurring in the morning, improved with drinking water or taking Nexium.  Mild chronic soreness in the epigastric area without change.  Denies BRBPR, melena, association with meals, or change in bowel habits.  Had been taking Nexium 20-40 mg as needed for reflux (1-2 times per week).  Since nausea started, he has been taking Nexium daily and symptoms are already improving.  I suspect his symptoms are secondary to GERD.  Increased sinus drainage may also be contributing.  Chronic intermittent constipation:  Well controlled with Linzess 145 mcg as needed.  No alarm symptoms.  Patient asking for Linzess samples today as he has trouble affording this medication.   Plan: Resume Nexium 40 mg daily.  Counseled on GERD diet/lifestyle.  Separate written instructions provided. Advised to discuss sinus drainage with PCP Continue Linzess 145 mcg daily as needed.  Samples provided. Follow-up in about 3 months.  Can consider reducing Nexium back to as needed at that time.  Requested patient to call if he continues with significant nausea in about 4 weeks.   Aliene Altes, PA-C [facility] 05/29/2021

## 2021-05-29 ENCOUNTER — Encounter: Payer: Self-pay | Admitting: Gastroenterology

## 2021-05-29 ENCOUNTER — Other Ambulatory Visit: Payer: Self-pay

## 2021-05-29 ENCOUNTER — Ambulatory Visit (INDEPENDENT_AMBULATORY_CARE_PROVIDER_SITE_OTHER): Payer: Medicare PPO | Admitting: Gastroenterology

## 2021-05-29 VITALS — BP 148/86 | HR 83 | Temp 97.1°F | Ht 72.0 in | Wt 314.0 lb

## 2021-05-29 DIAGNOSIS — K5900 Constipation, unspecified: Secondary | ICD-10-CM | POA: Diagnosis not present

## 2021-05-29 DIAGNOSIS — K219 Gastro-esophageal reflux disease without esophagitis: Secondary | ICD-10-CM | POA: Diagnosis not present

## 2021-05-29 DIAGNOSIS — R110 Nausea: Secondary | ICD-10-CM | POA: Diagnosis not present

## 2021-05-29 MED ORDER — ESOMEPRAZOLE MAGNESIUM 40 MG PO CPDR: 40.0000 mg | DELAYED_RELEASE_CAPSULE | Freq: Every day | ORAL | 3 refills | Status: DC

## 2021-05-29 NOTE — Patient Instructions (Signed)
I suspect your nausea may be secondary to reflux.  Your sinus drainage may also be contributing. You will need to discuss sinus drainage with your Primary Care doctor.   Take Nexium 40 mg every day first thing in the morning.  I have sent a new prescription to your pharmacy.  Follow a GERD diet:  Avoid fried, fatty, greasy, spicy, citrus foods. Avoid caffeine and carbonated beverages. Avoid chocolate. Try eating 4-6 small meals a day rather than 3 large meals. Do not eat within 3 hours of laying down. Prop head of bed up on wood or bricks to create a 6 inch incline.  Continue Linzess 145 mcg as needed for constipation.  We are providing you with samples today.  We will plan to follow-up with you in about 3 months.  Please call in about 4 weeks if you continue with significant nausea.  It was a pleasure to meet you today!  I hope you have a very Merry Christmas!  Aliene Altes, PA-C [facility]

## 2021-05-31 ENCOUNTER — Other Ambulatory Visit: Payer: Self-pay

## 2021-05-31 ENCOUNTER — Ambulatory Visit (HOSPITAL_COMMUNITY): Payer: Medicare PPO | Attending: Cardiology

## 2021-05-31 DIAGNOSIS — G459 Transient cerebral ischemic attack, unspecified: Secondary | ICD-10-CM | POA: Diagnosis not present

## 2021-05-31 LAB — ECHOCARDIOGRAM COMPLETE
Area-P 1/2: 2.71 cm2
S' Lateral: 2.5 cm

## 2021-06-01 DIAGNOSIS — R309 Painful micturition, unspecified: Secondary | ICD-10-CM | POA: Diagnosis not present

## 2021-06-01 DIAGNOSIS — I10 Essential (primary) hypertension: Secondary | ICD-10-CM | POA: Diagnosis not present

## 2021-06-01 DIAGNOSIS — E1122 Type 2 diabetes mellitus with diabetic chronic kidney disease: Secondary | ICD-10-CM | POA: Diagnosis not present

## 2021-06-05 DIAGNOSIS — E782 Mixed hyperlipidemia: Secondary | ICD-10-CM | POA: Diagnosis not present

## 2021-06-05 DIAGNOSIS — I10 Essential (primary) hypertension: Secondary | ICD-10-CM | POA: Diagnosis not present

## 2021-06-05 DIAGNOSIS — E1122 Type 2 diabetes mellitus with diabetic chronic kidney disease: Secondary | ICD-10-CM | POA: Diagnosis not present

## 2021-06-05 DIAGNOSIS — N1831 Chronic kidney disease, stage 3a: Secondary | ICD-10-CM | POA: Diagnosis not present

## 2021-06-05 DIAGNOSIS — I252 Old myocardial infarction: Secondary | ICD-10-CM | POA: Diagnosis not present

## 2021-06-05 DIAGNOSIS — Z955 Presence of coronary angioplasty implant and graft: Secondary | ICD-10-CM | POA: Diagnosis not present

## 2021-06-05 DIAGNOSIS — K219 Gastro-esophageal reflux disease without esophagitis: Secondary | ICD-10-CM | POA: Diagnosis not present

## 2021-06-05 DIAGNOSIS — K5900 Constipation, unspecified: Secondary | ICD-10-CM | POA: Diagnosis not present

## 2021-06-05 DIAGNOSIS — M545 Low back pain, unspecified: Secondary | ICD-10-CM | POA: Diagnosis not present

## 2021-06-13 IMAGING — CT CT LUMBAR SPINE WITH CONTRAST
1 of 6 series · 6 of 14 positions shown, 8 images · non-contrast
Comparison: none

CLINICAL DATA: Low back and bilateral leg pain and weakness.
Bilateral shoulder pain.
TECHNIQUE: Contiguous axial images were obtained through the Cervical,
Thoracic, and Lumbar spine after the intrathecal infusion of
infusion. Coronal and sagittal reconstructions were obtained of the
axial image sets.

[Series 3: l spine soft · axial · 0.44mm/px · z∈[-498,-316]mm · 6 of 87 slices shown, 8 images]
[im 13/87  soft-tissue]
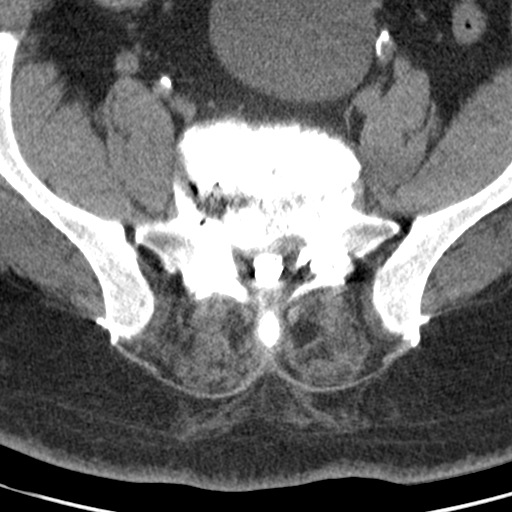
[im 13/87  bone]
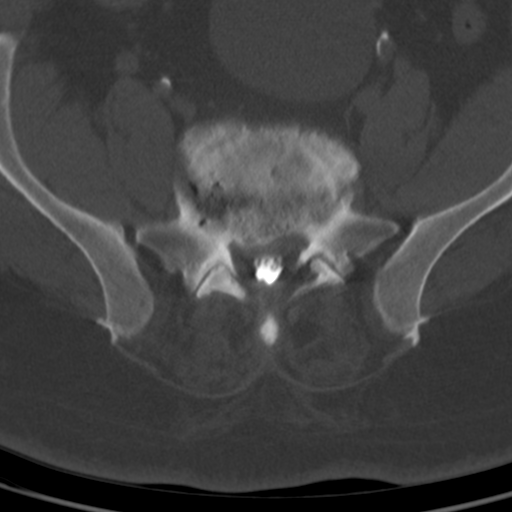
[im 25/87  bone]
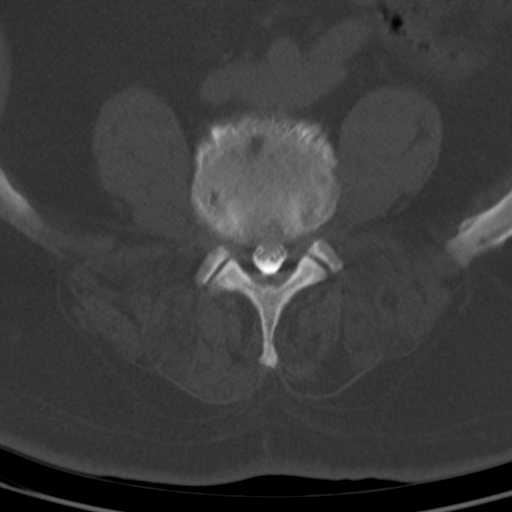
[im 37/87  bone]
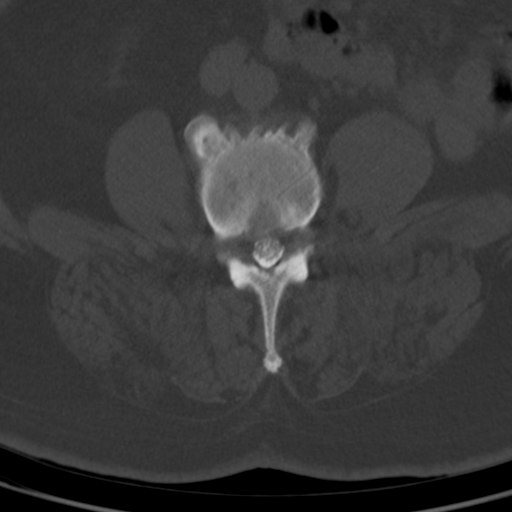
[im 50/87  bone]
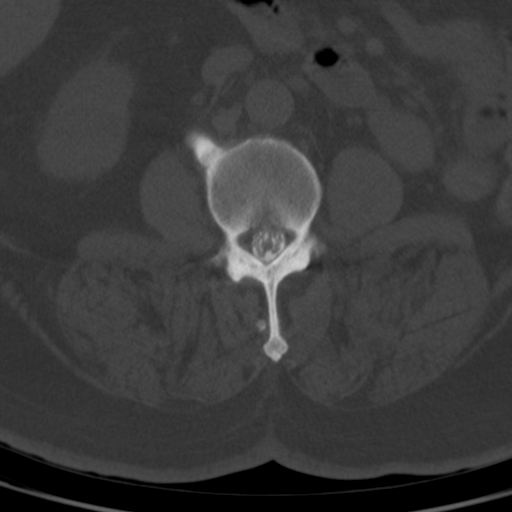
[im 62/87  soft-tissue]
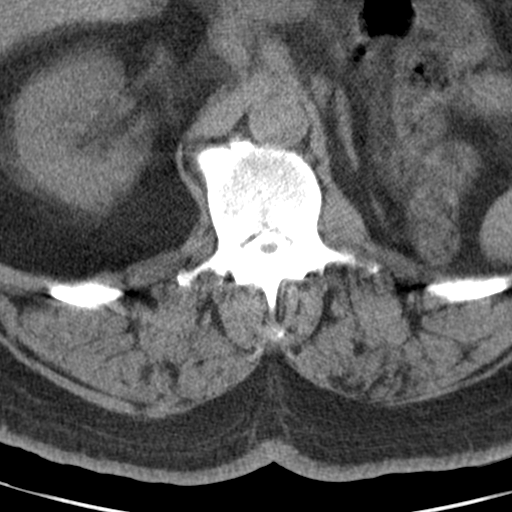
[im 62/87  bone]
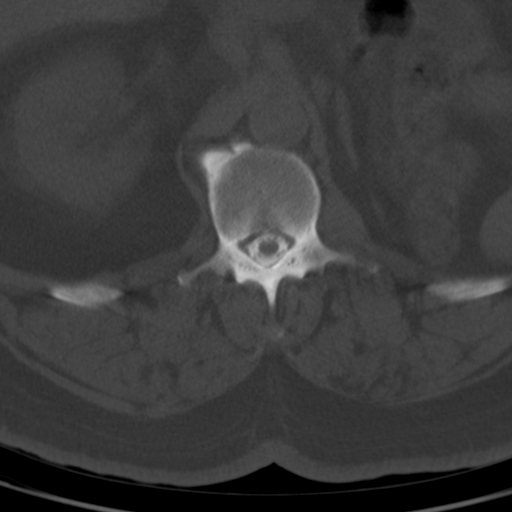
[im 74/87  bone]
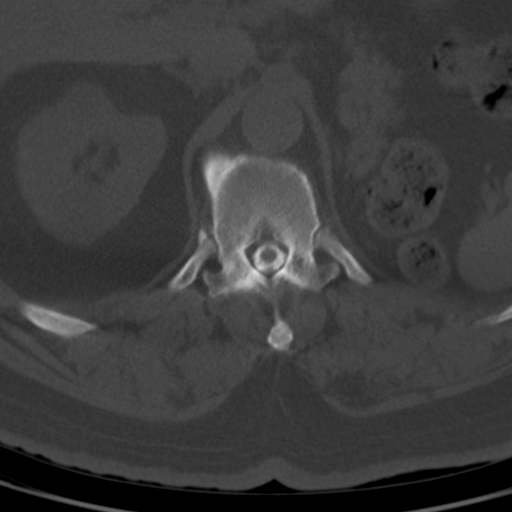

[6 of 14 positions shown; findings below may reference images not displayed]

FLUOROSCOPY TIME:  1 minutes 40 seconds. 839.40 micro gray meter
squared

PROCEDURE:
LUMBAR PUNCTURE FOR CERVICAL LUMBAR AND THORACIC MYELOGRAM

CERVICAL AND LUMBAR AND THORACIC MYELOGRAM

CT CERVICAL MYELOGRAM

CT LUMBAR MYELOGRAM

CT THORACIC MYELOGRAM

After thorough discussion of risks and benefits of the procedure
including bleeding, infection, injury to nerves, blood vessels,
adjacent structures as well as headache and CSF leak, written and
oral informed consent was obtained. Consent was obtained by Dr. Jumper
Sahagun.

Patient was positioned prone on the fluoroscopy table. Local
anesthesia was provided with 1% lidocaine without epinephrine after
prepped and draped in the usual sterile fashion. Puncture was
performed at L5-S1 using a 5 inch 22-gauge spinal needle via right
paramedian approach. Using a single pass through the dura, the
needle was placed within the thecal sac, with return of clear CSF.
10 mL Isovue [id number] was injected into the thecal sac, with normal
opacification of the nerve roots and cauda equina consistent with
free flow within the subarachnoid space. The patient was then moved
to the trendelenburg position and contrast flowed into the Thoracic
and Cervical spine regions.

I personally performed the lumbar puncture and administered the
intrathecal contrast. I also personally performed acquisition of the
myelogram images.
FINDINGS: CERVICAL AND LUMBAR MYELOGRAM FINDINGS:

Correlating with the CT studies below, there are only 4 lumbar type
vertebral bodies. This represents a difference in numbering from all
prior exams, as there has not been prior imaging the skull base
downward.

Mild multifactorial stenosis at L1-2.

Advanced chronic disc space narrowing at L2-3, L3-4 and L4-5.
Narrowing of the lateral recesses throughout that region.

In the thoracic region, there is moderate canal narrowing at T10-11
but without block to the passage of contrast.

In the cervical region, contrast opacity is limited as the patient
cannot extend his neck. There does not appear to be any compressive
canal stenosis. See results at CT.

CT CERVICAL MYELOGRAM FINDINGS:

Foramen magnum is widely patent. C1-2 shows ordinary osteoarthritis
without encroachment upon the neural spaces.

C2-3: Facet osteoarthritis left worse than right which could be
painful. Bilateral uncovertebral osteophytes. Bony foraminal
narrowing, left more than right.

C3-4: Large anterior bridging osteophytes but without apparent solid
union. Facet osteoarthritis on the left. Endplate and uncovertebral
osteophytes. Central canal stenosis with AP diameter in the midline
as narrow as 7 mm. Atrophic appearance of the cord. Left foraminal
stenosis that could affect the C4 nerve.

C4 through C6: Solid bridging anterior osteophytes. Endplate
osteophytes and disc bulges. Cord atrophy throughout the region. AP
diameter of the canal in the midline as narrow as 7 mm. Sufficient
patency of the C4-5 and C5-6 foramina.

C6-7: Anterior osteophytes but without apparent solid bridging.
Endplate osteophytes and bulging of the disc. Cord atrophy. Canal
stenosis with AP diameter in the midline as narrow as 7 mm. No
likely compressive foraminal narrowing.

C7-T1: Endplate osteophytes and bulging of the disc. Narrowing of
the ventral subarachnoid space but no compression of the cord.
Bilateral foraminal stenosis because of osteophytic encroachment
could affect either C8 nerve.

CT LUMBAR MYELOGRAM FINDINGS:

Numbering terminology counting down from the skull shows that there
are only 4 lumbar type vertebral bodies. This represents a variation
from previous numbering terminology.

L1-2: Bulging of the disc. Facet and ligamentous hypertrophy. Canal
narrowing with crowding of the nerve roots of the cauda equina and
the conus tip. No definite focal neural compression.

L2-3: Solid fusion. Sufficient patency of the canal. Osteophytic
narrowing of the foramina but without likely neural compression.

L3-4: Chronic disc space narrowing but without apparent fusion.
Endplate osteophytes and bulging of the disc. No central canal
stenosis. Bilateral lateral recess and foraminal narrowing which
could potentially cause neural compression.

L4-5: Last disc space. Chronic disc degeneration with vacuum
phenomenon. Endplate osteophytes. Facet osteoarthritis. No central
canal stenosis. Bilateral subarticular lateral recess and foraminal
stenosis could cause neural compression on either side

CT THORACIC MYELOGRAM FINDINGS:

T1-2: Uncovertebral osteophytes. No central canal narrowing. Mild
bilateral bony foraminal narrowing.

T2-3: Chronic central calcified disc herniation indents the ventral
subarachnoid space but does not compress the cord. Bony foraminal
narrowing on the left due to encroachment by uncovertebral
osteophytes and facet osteophytes.

T3 through T6: Fusion across the disc spaces. Sufficient patency of
the canal. No compressive foraminal stenosis at those 3 levels.

T6-7: Central disc herniation effaces the ventral subarachnoid space
but does not compress the cord or show foraminal extension.
Foraminal narrowing right more than left because of facet
osteophytes.

T7 through T9: Solid anterior bridging osteophytes. No significant
disc pathology. No canal or foraminal stenosis.

T9-10: Left-sided predominant osteophytes and bulging disc material.
Narrowing of the subarachnoid space without compression of the cord.
Bilateral facet osteoarthritis. No compressive stenosis.

T10-11: Endplate osteophytes and bulging of the disc more towards
the right. Facet osteoarthritis. Bilateral foraminal stenosis right
worse than left. Potential for compression of either T10 nerve.

T11-12: Mild bulging of the disc. Facet osteoarthritis. No canal or
foraminal stenosis.

T12-L1: No disc pathology.  Mild facet osteoarthritis.  No stenosis.
IMPRESSION: Cervical region: Solid bridging osteophytes from C4 through C6.
Large anterior osteophytes without definite solid bridging at C3-4,
C6-7 and C7-T1. Canal stenosis from C2-3 through C6-7 with AP
diameter measuring about 7 mm. The cord appears atrophic in this
region. There is facet arthropathy on the right at C2-3 and C7-T1
and on the left at C2-3, C3-4 and C7-T1. Foraminal narrowing could
potentially cause neural compression on the bilateral at C2-3, C3-4
and C7-T1. Foraminal narrowing is worse on the left at C2-3 and
C3-4.

Thoracic region: Solid fusion across the disc spaces from T3 through
T6. Solid anterior bridging osteophytes from T7-T9. Prominent
anterior osteophytes at the other thoracic regions without definite
fusion at those levels. Central disc herniation at T6-7 but without
likely compressive effect upon the cord. Canal stenosis worse at
T10-11 than T9-10. Foraminal stenosis at T9-10 and T10-11 with
potential for neural compression, more so at T10-11.

Lumbar region: Problematic numbering, as there are only 4 lumbar
type vertebral bodies. This has not been known on the prior imaging
as we have never imaged down from the skull base. Moderate
multifactorial stenosis at L1-2 but without definite neural
compression. Vertebral body fusion at L2-3. Foraminal narrowing but
without likely neural compression. L3-4 spondylosis with bilateral
foraminal narrowing that could affect either exiting nerve root.
L4-5 spondylosis with bilateral foraminal stenosis likely to cause
neural compression.

## 2021-07-03 NOTE — Progress Notes (Incomplete)
Chief Complaint: No chief complaint on file.   History of Present Illness:  Gregory Wilson is a 81 y.o. year old male who is seen in consultation from Celene Squibb, MD  for evaluation of ***.   Past Medical History:  Past Medical History:  Diagnosis Date   Acute ST elevation myocardial infarction (STEMI) of anterior wall ([place]) 03/07/2017   Arthritis    BPH (benign prostatic hypertrophy)    CAD (coronary artery disease)    a. Lexiscan Myoview (11/2013):  Normal stress nuclear study.  LV Ejection Fraction: 57%   Complication of anesthesia    hx PONV   Degenerative joint disease    GERD (gastroesophageal reflux disease)    H/O hiatal hernia    History of atrial flutter    REMOTE HX ON TREADMILL   History of colon polyps    Hyperlipidemia    Hypertension    IBS (irritable bowel syndrome)    Lower urinary tract symptoms (LUTS)    OSA (obstructive sleep apnea) 03/26/2016   RBBB (right bundle branch block)    S/P drug eluting coronary stent placement    11-26-2001   X2 DES  TO LAD &  BMS   Schatzki's ring    Status post dilation of esophageal narrowing     Past Surgical History:  Past Surgical History:  Procedure Laterality Date   APPENDECTOMY     CARDIOVASCULAR STRESS TEST  12/09/2013   NUCLEAR LEXISCAN STUDY--  RESULTS PENDING   CARDIOVASCULAR STRESS TEST  04/2011  DR CRENSHAW   NORMAL /  NO ISCHEMIA/  EF 67%   CHOLECYSTECTOMY     COLONOSCOPY  05/29/2011   RMR: tubular adenoma   COLONOSCOPY WITH PROPOFOL N/A 05/23/2020   ;  Surgeon: Daneil Dolin, MD;  Nonbleeding internal hemorrhoids, diverticulosis in sigmoid and descending colon, 10 polyps removed.Pathology revealed tubular adenomas.  Recommended repeat colonoscopy in 3 years if health permits.   CORONARY ANGIOPLASTY WITH STENT PLACEMENT  11-26-2001  DR Eustace Quail   DES (Cypher) to LAD 70%/  BMS (Express) to OSTIAL D1 80%/   CFX  &  RCA normal/  EF 65%   CORONARY/GRAFT ACUTE MI REVASCULARIZATION N/A  03/07/2017   Procedure: Coronary/Graft Acute MI Revascularization;  Surgeon: Sherren Mocha, MD;  Location: [place] [facility];  Service: Cardiovascular;  Laterality: N/A;   ESOPHAGOGASTRODUODENOSCOPY  04/05/2004   RMR: Small hiatal hernia/Questionable subtle Schatzki's ring, status post dilation as described   ESOPHAGOGASTRODUODENOSCOPY  05/2011   RMR: small hiatal hernia, empiric dilation with 82F and 103F Maloney   ESOPHAGOGASTRODUODENOSCOPY (EGD) WITH ESOPHAGEAL DILATION N/A 01/16/2013   Dr. Rourk:baggy somewhat atonic esophagus-query underlying esophageal motility disorder. Status post passage of a Maloney dilator (large bore) empirically. Status post esophageal biopsy/Reflux symptoms are well controlled on Nexium.benign path   ESOPHAGOGASTRODUODENOSCOPY (EGD) WITH PROPOFOL N/A 05/23/2020   Surgeon: Daneil Dolin, MD; Somewhat baggy esophageal body, otherwise normal esophagus s/p empiric dilation, small hiatal hernia, otherwise normal exam.   LEFT HEART CATH AND CORONARY ANGIOGRAPHY N/A 03/07/2017   Procedure: LEFT HEART CATH AND CORONARY ANGIOGRAPHY;  Surgeon: Sherren Mocha, MD;  Location: [facility] [facility];  Service: Cardiovascular;  Laterality: N/A;   MALONEY DILATION N/A 05/23/2020   Procedure: Venia Minks DILATION;  Surgeon: Daneil Dolin, MD;  [facility];  Service: Endoscopy;  Laterality: N/A;   POLYPECTOMY  05/23/2020   Procedure: POLYPECTOMY;  Surgeon: Daneil Dolin, MD;  [facility];  Service: Endoscopy;;   TOTAL HIP ARTHROPLASTY Left 09/07/2003   TOTAL KNEE ARTHROPLASTY Left 07/27/2002   TRANSURETHRAL RESECTION OF PROSTATE N/A 12/21/2013   Procedure: TRANSURETHRAL RESECTION OF THE PROSTATE WITH GYRUS INSTRUMENTS;  Surgeon: Jorja Loa, MD;  Location: [facility];  Service: Urology;  Laterality: N/A;    Allergies:  Allergies  Allergen Reactions   Codeine Nausea And Vomiting    Family History:  Family History  Problem  Relation Age of Onset   Cancer Father        Lung   Hypertension Mother    Coronary artery disease Mother    Kidney failure Mother    Cancer Sister        breast carcinoma   Hypertension Sister    Colon cancer Neg Hx     Social History:  Social History   Tobacco Use   Smoking status: Never   Smokeless tobacco: Never  Vaping Use   Vaping Use: Never used  Substance Use Topics   Alcohol use: Not Currently    Comment: on holidays   Drug use: No    Review of symptoms:  Constitutional:  Negative for unexplained weight loss, night sweats, fever, chills ENT:  Negative for nose bleeds, sinus pain, painful swallowing CV:  Negative for chest pain, shortness of breath, exercise intolerance, palpitations, loss of consciousness Resp:  Negative for cough, wheezing, shortness of breath GI:  Negative for nausea, vomiting, diarrhea, bloody stools GU:  Positives noted in HPI; otherwise negative for gross hematuria, dysuria, urinary incontinence Neuro:  Negative for seizures, poor balance, limb weakness, slurred speech Psych:  Negative for lack of energy, depression, anxiety Endocrine:  Negative for polydipsia, polyuria, symptoms of hypoglycemia (dizziness, hunger, sweating) Hematologic:  Negative for anemia, purpura, petechia, prolonged or excessive bleeding, use of anticoagulants  Allergic:  Negative for difficulty breathing or choking as a result of exposure to anything; no shellfish allergy; no allergic response (rash/itch) to materials, foods  Physical exam: There were no vitals taken for this visit. GENERAL APPEARANCE:  Well appearing, well developed, well nourished, NAD HEENT: Atraumatic, Normocephalic, oropharynx clear. NECK: Supple without lymphadenopathy or thyromegaly. LUNGS: Clear to auscultation bilaterally. HEART: Regular Rate and Rhythm without murmurs, gallops, or rubs. ABDOMEN: Soft, non-tender, No Masses. EXTREMITIES: Moves all extremities well.  Without clubbing,  cyanosis, or edema. NEUROLOGIC:  Alert and oriented x 3, normal gait, CN II-XII grossly intact.  MENTAL STATUS:  Appropriate. BACK:  Non-tender to palpation.  No CVAT SKIN:  Warm, dry and intact.    Results: No results found for this or any previous visit (from the past 24 hour(s)).   Assessment: ***   Plan: ***

## 2021-07-04 ENCOUNTER — Ambulatory Visit: Payer: Medicare PPO | Admitting: Urology

## 2021-07-24 NOTE — Progress Notes (Signed)
H&P  Chief Complaint: Prostate complaints  History of Present Illness: 81 year old male comes in today for follow-up of prostate issues.  Status post transurethral resection of the prostate by me in June, 2015.  About 30 g of prostate resected, all benign.  Overall, her urinary symptoms are stable.  He does have occasional blood spots on his underwear.  Never gross hematuria.  These are occasional but not frequent.  This is been happening for quite a few years.  He does have occasional dysuria.  Given a 30-day prescription for doxycycline by Dr. Alen Blew in [facility] in [place].  This was done about a year or 2 ago.  It does cause constipation and abdominal pain.  He is not currently complaining of dysuria, gross hematuria or smelly urine.  Patient states that he is worried about his PSA as he says it is a bit elevated.  His PSA when checked by Dr. Wende Neighbors in [place] in early December 2022 was 4.3.  Past Medical History:  Diagnosis Date   Acute ST elevation myocardial infarction (STEMI) of anterior wall ([place]) 03/07/2017   Arthritis    BPH (benign prostatic hypertrophy)    CAD (coronary artery disease)    a. Lexiscan Myoview (11/2013):  Normal stress nuclear study.  LV Ejection Fraction: 16%   Complication of anesthesia    hx PONV   Degenerative joint disease    GERD (gastroesophageal reflux disease)    H/O hiatal hernia    History of atrial flutter    REMOTE HX ON TREADMILL   History of colon polyps    Hyperlipidemia    Hypertension    IBS (irritable bowel syndrome)    Lower urinary tract symptoms (LUTS)    OSA (obstructive sleep apnea) 03/26/2016   RBBB (right bundle branch block)    S/P drug eluting coronary stent placement    11-26-2001   X2 DES  TO LAD &  BMS   Schatzki's ring    Status post dilation of esophageal narrowing     Past Surgical History:  Procedure Laterality Date   APPENDECTOMY     CARDIOVASCULAR STRESS TEST  12/09/2013   NUCLEAR LEXISCAN  STUDY--  RESULTS PENDING   CARDIOVASCULAR STRESS TEST  04/2011  DR CRENSHAW   NORMAL /  NO ISCHEMIA/  EF 67%   CHOLECYSTECTOMY     COLONOSCOPY  05/29/2011   RMR: tubular adenoma   COLONOSCOPY WITH PROPOFOL N/A 05/23/2020   ;  Surgeon: Daneil Dolin, MD;  Nonbleeding internal hemorrhoids, diverticulosis in sigmoid and descending colon, 10 polyps removed.Pathology revealed tubular adenomas.  Recommended repeat colonoscopy in 3 years if health permits.   CORONARY ANGIOPLASTY WITH STENT PLACEMENT  11-26-2001  DR Eustace Quail   DES (Cypher) to LAD 70%/  BMS (Express) to OSTIAL D1 80%/   CFX  &  RCA normal/  EF 65%   CORONARY/GRAFT ACUTE MI REVASCULARIZATION N/A 03/07/2017   Procedure: Coronary/Graft Acute MI Revascularization;  Surgeon: Sherren Mocha, MD;  Location: [place] [facility];  Service: Cardiovascular;  Laterality: N/A;   ESOPHAGOGASTRODUODENOSCOPY  04/05/2004   RMR: Small hiatal hernia/Questionable subtle Schatzki's ring, status post dilation as described   ESOPHAGOGASTRODUODENOSCOPY  05/2011   RMR: small hiatal hernia, empiric dilation with 6F and 27F Maloney   ESOPHAGOGASTRODUODENOSCOPY (EGD) WITH ESOPHAGEAL DILATION N/A 01/16/2013   Dr. Rourk:baggy somewhat atonic esophagus-query underlying esophageal motility disorder. Status post passage of a Maloney dilator (large bore) empirically. Status post esophageal biopsy/Reflux symptoms are well controlled on  Nexium.benign path   ESOPHAGOGASTRODUODENOSCOPY (EGD) WITH PROPOFOL N/A 05/23/2020   Surgeon: Daneil Dolin, MD; Somewhat baggy esophageal body, otherwise normal esophagus s/p empiric dilation, small hiatal hernia, otherwise normal exam.   LEFT HEART CATH AND CORONARY ANGIOGRAPHY N/A 03/07/2017   Procedure: LEFT HEART CATH AND CORONARY ANGIOGRAPHY;  Surgeon: Sherren Mocha, MD;  Location: [place] [facility];  Service: Cardiovascular;  Laterality: N/A;   MALONEY DILATION N/A 05/23/2020   Procedure: Venia Minks DILATION;  Surgeon:  Daneil Dolin, MD;  [facility];  Service: Endoscopy;  Laterality: N/A;   POLYPECTOMY  05/23/2020   Procedure: POLYPECTOMY;  Surgeon: Daneil Dolin, MD;  [facility];  Service: Endoscopy;;   TOTAL HIP ARTHROPLASTY Left 09/07/2003   TOTAL KNEE ARTHROPLASTY Left 07/27/2002   TRANSURETHRAL RESECTION OF PROSTATE N/A 12/21/2013   Procedure: TRANSURETHRAL RESECTION OF THE PROSTATE WITH GYRUS INSTRUMENTS;  Surgeon: Jorja Loa, MD;  Location: [facility];  Service: Urology;  Laterality: N/A;    Home Medications:  Allergies as of 07/25/2021       Reactions   Codeine Nausea And Vomiting        Medication List        Accurate as of July 24, 2021  8:45 PM. If you have any questions, ask your nurse or doctor.          amLODipine 10 MG tablet Commonly known as: NORVASC TAKE 1 TABLET EVERY MORNING (KEEP OFFICE VISIT)   aspirin 81 MG EC tablet Take 1 tablet (81 mg total) by mouth daily.   atorvastatin 80 MG tablet Commonly known as: LIPITOR TAKE 1 TABLET EVERY DAY  AT  6PM (KEEP MD APPOINTMENT)   dapagliflozin propanediol 5 MG Tabs tablet Commonly known as: FARXIGA Take by mouth daily.   esomeprazole 40 MG capsule Commonly known as: NEXIUM Take 1 capsule (40 mg total) by mouth daily before breakfast.   fluticasone 50 MCG/ACT nasal spray Commonly known as: FLONASE Place 1 spray into both nostrils daily as needed for allergies.   furosemide 20 MG tablet Commonly known as: LASIX TAKE 1 TABLET(20 MG) BY MOUTH DAILY   ibuprofen 800 MG tablet Commonly known as: ADVIL Take 800 mg by mouth every 8 (eight) hours as needed for moderate pain.   irbesartan 300 MG tablet Commonly known as: AVAPRO TAKE 1 TABLET EVERY DAY (KEEP MD APPOINTMENT)   Linzess 145 MCG Caps capsule Generic drug: linaclotide Take 145 mcg by mouth daily as needed (constipation).   metoprolol succinate 100 MG 24 hr tablet Commonly known as:  TOPROL-XL TAKE 1 TABLET EVERY DAY (KEEP OFFICE VISIT)   PYRIDOXINE HCL PO Take 2 tablets by mouth daily.   TURMERIC-GINGER PO Take 2 tablets by mouth daily.        Allergies:  Allergies  Allergen Reactions   Codeine Nausea And Vomiting    Family History  Problem Relation Age of Onset   Cancer Father        Lung   Hypertension Mother    Coronary artery disease Mother    Kidney failure Mother    Cancer Sister        breast carcinoma   Hypertension Sister    Colon cancer Neg Hx     Social History:  reports that he has never smoked. He has never used smokeless tobacco. He reports that he does not currently use alcohol. He reports that he does not use drugs.  ROS: A complete review of systems  was performed.  All systems are negative except for pertinent findings as noted.  Physical Exam:  Vital signs in last 24 hours: There were no vitals taken for this visit. Constitutional:  Alert and oriented, No acute distress Cardiovascular: Regular rate  Respiratory: Normal respiratory effort GI: Abdomen is soft, nontender, nondistended, no abdominal masses. No CVAT.  Genitourinary: Normal uncircumcised male phallus, testes are descended bilaterally and non-tender and without masses.  Right testicle is atrophic.  Scrotum is normal in appearance without lesions or masses, perineum is normal on inspection.  Anal sphincter tone somewhat diminished.  Prostate 40 g, symmetric, nonnodular, nontender. Lymphatic: No lymphadenopathy Neurologic: Grossly intact, no focal deficits Psychiatric: Normal mood and affect  I have reviewed prior pt notes  I have reviewed notes from [facility]  I have reviewed urinalysis results  I have independently reviewed pathology results  I have reviewed prior PSA results-most recently 4.3    Impression/Assessment:  1.  Intermittent bloody drainage from penis, most likely from having had prostatectomy in the past and having mild amount of bloody  drainage from his resected prostatic urethra.  I do not think he needs further evaluation.  2.  Mild elevation of PSA, 81 year old male with normal DRE.  Negative biopsy/TUR specimen in 2015.  I do not think he needs further PSA testing  3.  History of dysuria.  Currently, I do not think he needs treatment with antibiotics  4.  History of BPH, status post TURP 7 years ago, overall voiding well  Plan:  1.  I reassured him of the small amount of blood on his undershorts, and told him that the source was most likely the prostatic urethra  2.  Reassurance regarding his PSA and DRE  3.  If his dysuria is just sporadic, I do not think he necessarily needs this treated as he has had negative cultures and negative urinalyses in the past  4.  I will have him come back on an annual basis

## 2021-07-25 ENCOUNTER — Encounter: Payer: Self-pay | Admitting: Urology

## 2021-07-25 ENCOUNTER — Other Ambulatory Visit: Payer: Self-pay

## 2021-07-25 ENCOUNTER — Ambulatory Visit (INDEPENDENT_AMBULATORY_CARE_PROVIDER_SITE_OTHER): Payer: Medicare PPO | Admitting: Urology

## 2021-07-25 VITALS — BP 147/84 | HR 67 | Ht 72.0 in | Wt 318.8 lb

## 2021-07-25 DIAGNOSIS — R300 Dysuria: Secondary | ICD-10-CM

## 2021-07-25 DIAGNOSIS — N400 Enlarged prostate without lower urinary tract symptoms: Secondary | ICD-10-CM

## 2021-07-25 LAB — URINALYSIS, ROUTINE W REFLEX MICROSCOPIC
Bilirubin, UA: NEGATIVE
Glucose, UA: NEGATIVE
Ketones, UA: NEGATIVE
Leukocytes,UA: NEGATIVE
Nitrite, UA: NEGATIVE
Protein,UA: NEGATIVE
Specific Gravity, UA: 1.015 (ref 1.005–1.030)
Urobilinogen, Ur: 0.2 mg/dL (ref 0.2–1.0)
pH, UA: 5.5 (ref 5.0–7.5)

## 2021-07-25 LAB — MICROSCOPIC EXAMINATION
Bacteria, UA: NONE SEEN
Epithelial Cells (non renal): NONE SEEN /hpf (ref 0–10)
RBC, Urine: NONE SEEN /hpf (ref 0–2)
Renal Epithel, UA: NONE SEEN /hpf
WBC, UA: NONE SEEN /hpf (ref 0–5)

## 2021-07-25 NOTE — Progress Notes (Signed)
Urological Symptom Review  Patient is experiencing the following symptoms: Burning/pain with urination Get up at night to urinate   Review of Systems  Gastrointestinal (upper)  : Indigestion/heartburn  Gastrointestinal (lower) : Constipation  Constitutional : Negative for symptoms  Skin: Negative for skin symptoms  Eyes: Negative for eye symptoms  Ear/Nose/Throat : Sinus problems  Hematologic/Lymphatic: Negative for Hematologic/Lymphatic symptoms  Cardiovascular : Negative for cardiovascular symptoms  Respiratory : Negative for respiratory symptoms  Endocrine: Negative for endocrine symptoms  Musculoskeletal: Back pain Joint pain  Neurological: Negative for neurological symptoms  Psychologic: Negative for psychiatric symptoms

## 2021-08-05 IMAGING — US US EXTREM LOW VENOUS
1 series · 13 of 24 positions shown · non-contrast
Comparison: None.

CLINICAL DATA: Leg swelling.



[Series 1: us extrem low venous · 13 of 62 slices shown]
[im 1/62]
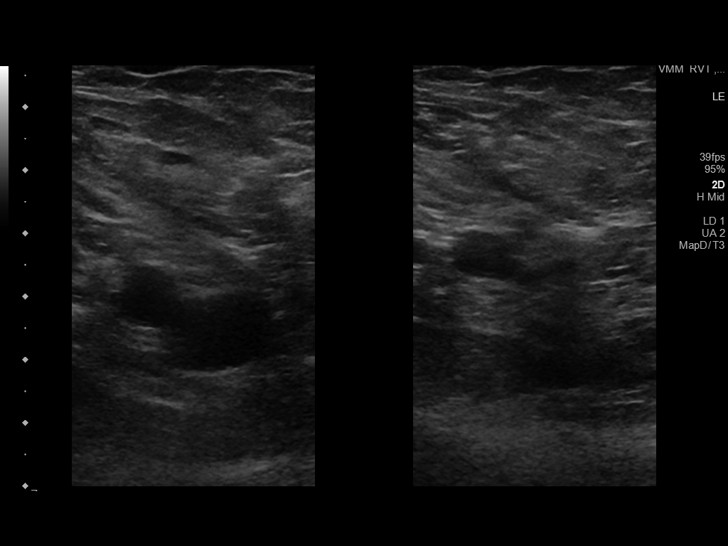
[im 6/62]
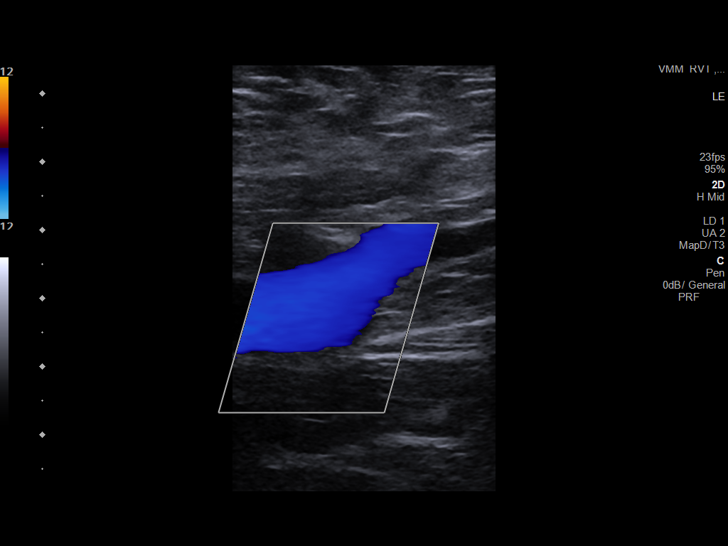
[im 11/62]
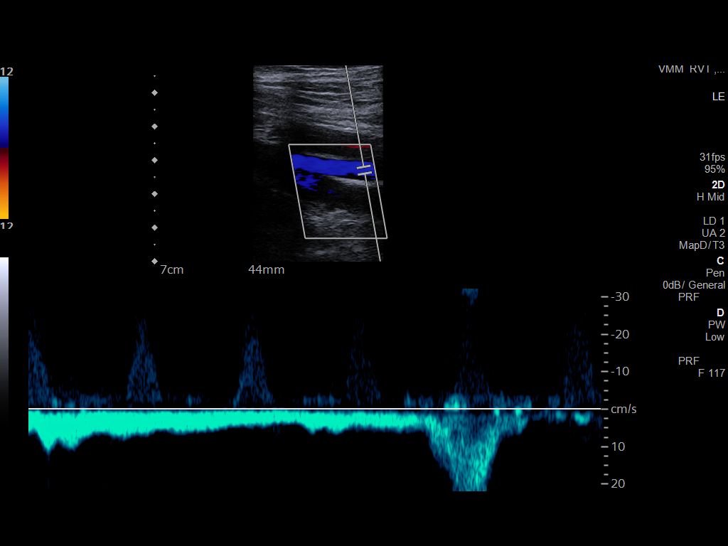
[im 16/62]
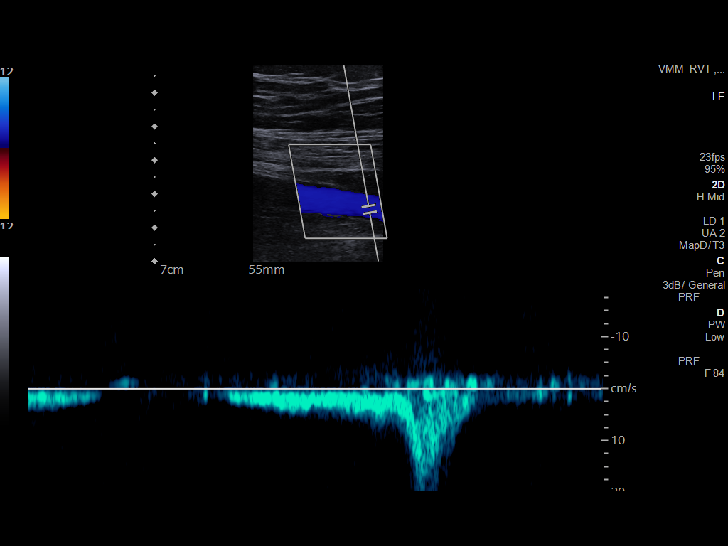
[im 22/62]
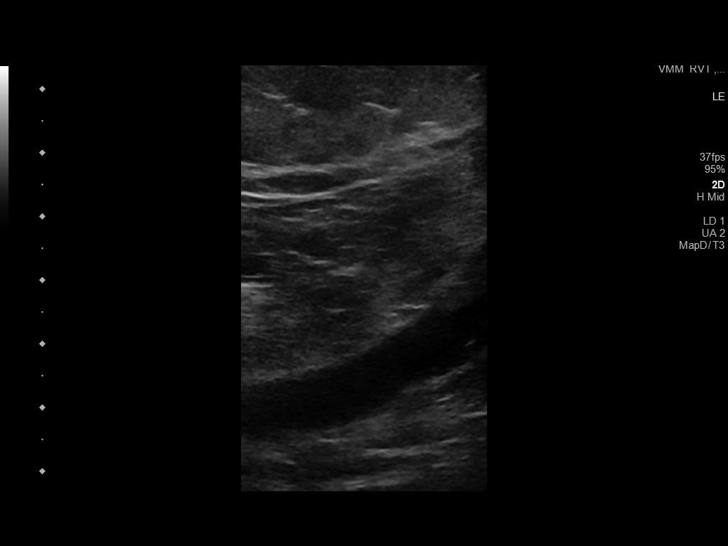
[im 27/62]
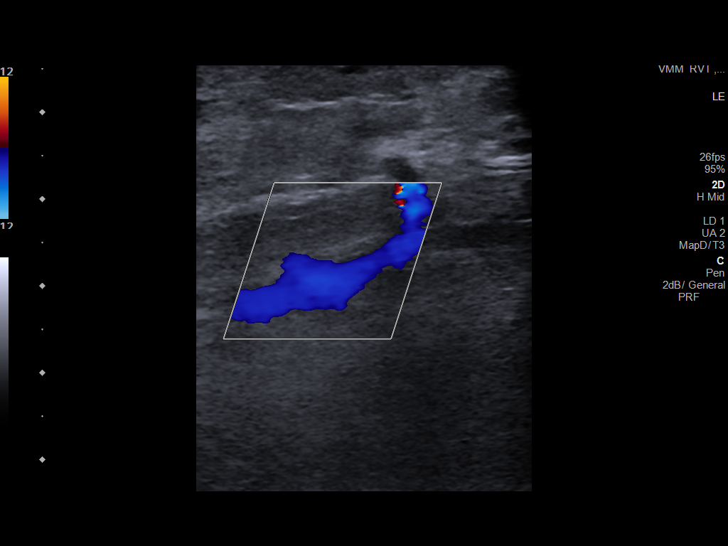
[im 32/62]
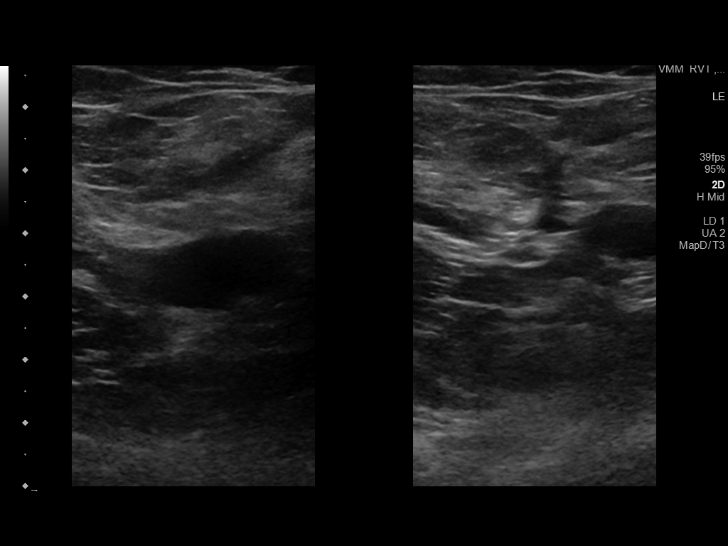
[im 35/62]
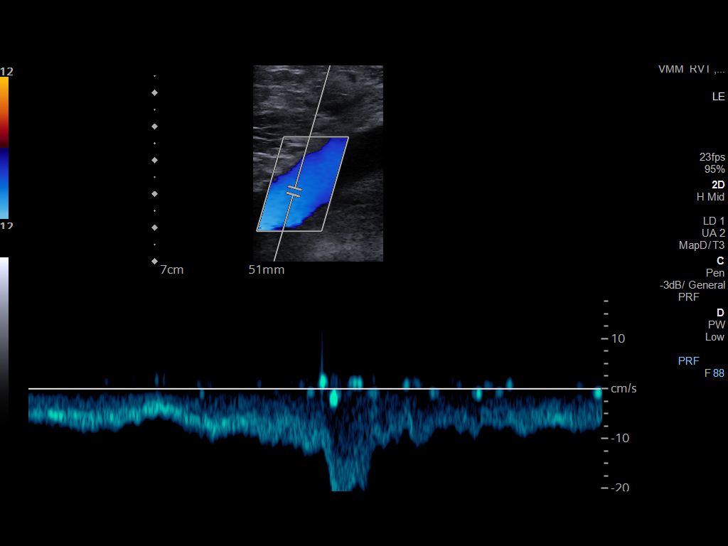
[im 40/62]
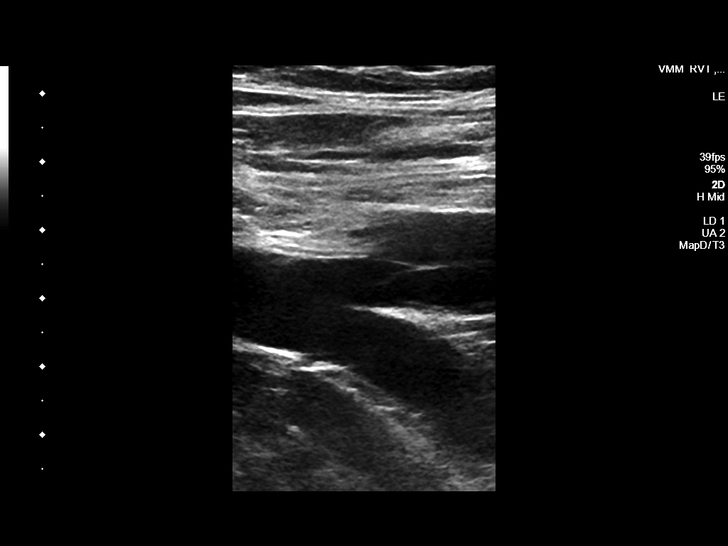
[im 46/62]
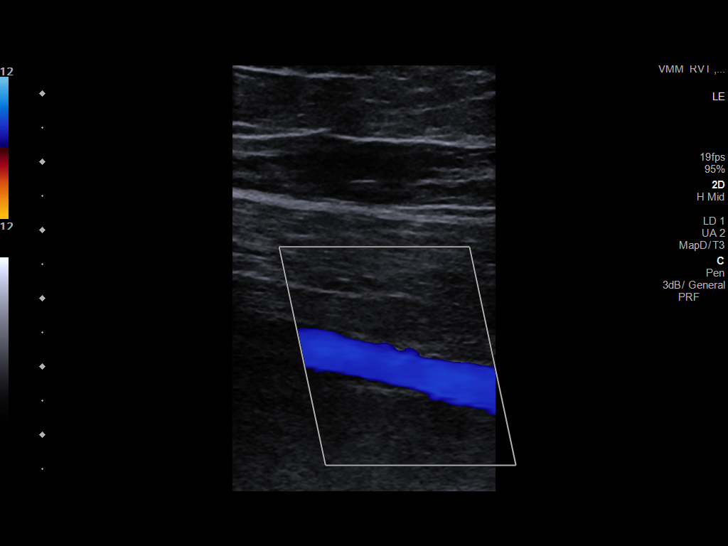
[im 51/62]
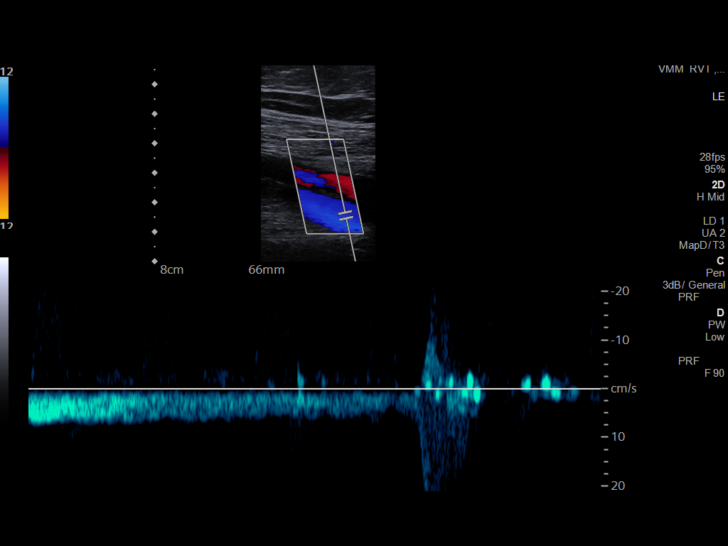
[im 56/62]
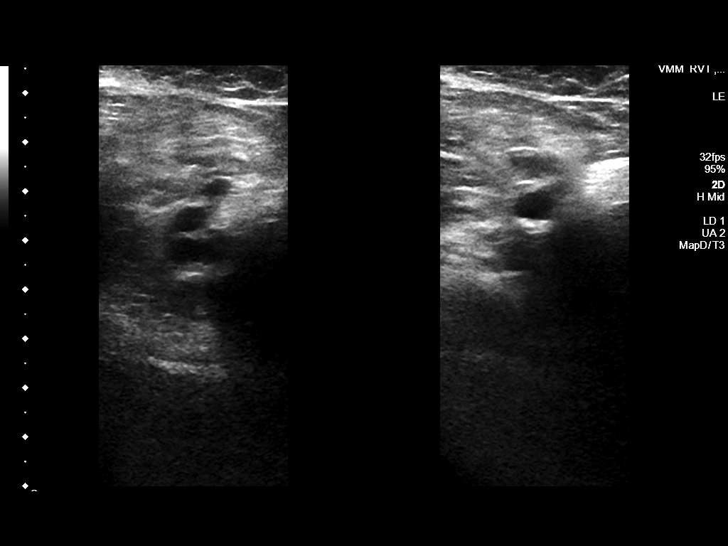
[im 62/62]
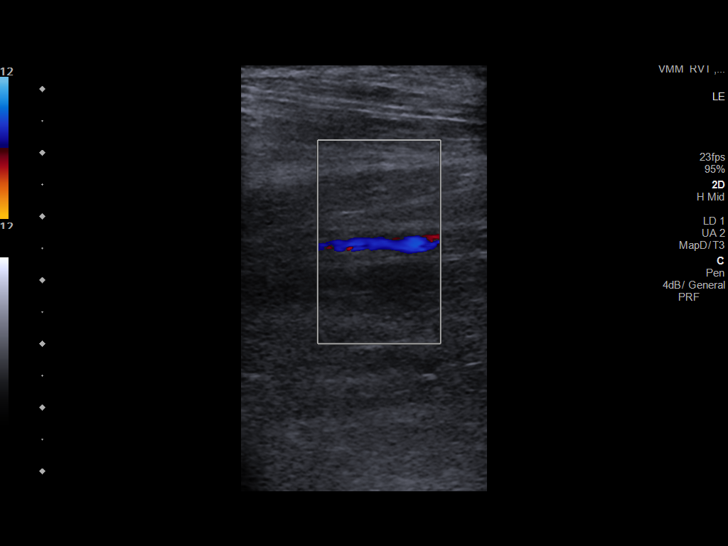

[13 of 24 positions shown; findings below may reference images not displayed]

FINDINGS: RIGHT LOWER EXTREMITY

Common Femoral Vein: No evidence of thrombus. Normal
compressibility, respiratory phasicity and response to augmentation.

Saphenofemoral Junction: No evidence of thrombus. Normal
compressibility and flow on color Doppler imaging.

Profunda Femoral Vein: No evidence of thrombus. Normal
compressibility and flow on color Doppler imaging.

Femoral Vein: No evidence of thrombus. Normal compressibility,
respiratory phasicity and response to augmentation.

Popliteal Vein: No evidence of thrombus. Normal compressibility,
respiratory phasicity and response to augmentation.

Calf Veins: No evidence of thrombus. Normal compressibility and flow
on color Doppler imaging.

Superficial Great Saphenous Vein: No evidence of thrombus. Normal
compressibility.

Other Findings:  None.

LEFT LOWER EXTREMITY

Common Femoral Vein: No evidence of thrombus. Normal
compressibility, respiratory phasicity and response to augmentation.

Saphenofemoral Junction: No evidence of thrombus. Normal
compressibility and flow on color Doppler imaging.

Profunda Femoral Vein: No evidence of thrombus. Normal
compressibility and flow on color Doppler imaging.

Femoral Vein: No evidence of thrombus. Normal compressibility,
respiratory phasicity and response to augmentation.

Popliteal Vein: No evidence of thrombus. Normal compressibility,
respiratory phasicity and response to augmentation.

Calf Veins: No evidence of thrombus. Normal compressibility and flow
on color Doppler imaging.

Superficial Great Saphenous Vein: No evidence of thrombus. Normal
compressibility.

Other Findings:  None.
IMPRESSION: No evidence of deep venous thrombosis in either lower extremity.

## 2021-08-29 DIAGNOSIS — I10 Essential (primary) hypertension: Secondary | ICD-10-CM | POA: Diagnosis not present

## 2021-08-29 DIAGNOSIS — E1122 Type 2 diabetes mellitus with diabetic chronic kidney disease: Secondary | ICD-10-CM | POA: Diagnosis not present

## 2021-08-31 ENCOUNTER — Other Ambulatory Visit: Payer: Self-pay | Admitting: Cardiology

## 2021-09-04 DIAGNOSIS — I10 Essential (primary) hypertension: Secondary | ICD-10-CM | POA: Diagnosis not present

## 2021-09-04 DIAGNOSIS — E782 Mixed hyperlipidemia: Secondary | ICD-10-CM | POA: Diagnosis not present

## 2021-09-04 DIAGNOSIS — M545 Low back pain, unspecified: Secondary | ICD-10-CM | POA: Diagnosis not present

## 2021-09-04 DIAGNOSIS — N1831 Chronic kidney disease, stage 3a: Secondary | ICD-10-CM | POA: Diagnosis not present

## 2021-09-04 DIAGNOSIS — K5900 Constipation, unspecified: Secondary | ICD-10-CM | POA: Diagnosis not present

## 2021-09-04 DIAGNOSIS — Z955 Presence of coronary angioplasty implant and graft: Secondary | ICD-10-CM | POA: Diagnosis not present

## 2021-09-04 DIAGNOSIS — K219 Gastro-esophageal reflux disease without esophagitis: Secondary | ICD-10-CM | POA: Diagnosis not present

## 2021-09-04 DIAGNOSIS — E1122 Type 2 diabetes mellitus with diabetic chronic kidney disease: Secondary | ICD-10-CM | POA: Diagnosis not present

## 2021-09-06 DIAGNOSIS — M5441 Lumbago with sciatica, right side: Secondary | ICD-10-CM | POA: Diagnosis not present

## 2021-09-06 DIAGNOSIS — G8929 Other chronic pain: Secondary | ICD-10-CM | POA: Diagnosis not present

## 2021-09-11 ENCOUNTER — Other Ambulatory Visit: Payer: Self-pay | Admitting: Neurosurgery

## 2021-09-11 DIAGNOSIS — G8929 Other chronic pain: Secondary | ICD-10-CM

## 2021-09-25 ENCOUNTER — Other Ambulatory Visit: Payer: Self-pay

## 2021-09-25 ENCOUNTER — Ambulatory Visit
Admission: RE | Admit: 2021-09-25 | Discharge: 2021-09-25 | Disposition: A | Discharge status: Home / Self Care | Payer: Medicare PPO | Source: Ambulatory Visit | Attending: Neurosurgery | Admitting: Neurosurgery

## 2021-09-25 DIAGNOSIS — R29898 Other symptoms and signs involving the musculoskeletal system: Secondary | ICD-10-CM | POA: Diagnosis not present

## 2021-09-25 DIAGNOSIS — M2578 Osteophyte, vertebrae: Secondary | ICD-10-CM | POA: Diagnosis not present

## 2021-09-25 DIAGNOSIS — M48061 Spinal stenosis, lumbar region without neurogenic claudication: Secondary | ICD-10-CM | POA: Diagnosis not present

## 2021-09-25 DIAGNOSIS — M545 Low back pain, unspecified: Secondary | ICD-10-CM | POA: Diagnosis not present

## 2021-09-25 DIAGNOSIS — G8929 Other chronic pain: Secondary | ICD-10-CM

## 2021-10-02 ENCOUNTER — Ambulatory Visit: Payer: Medicare PPO | Admitting: Gastroenterology

## 2021-10-17 NOTE — Progress Notes (Deleted)
Referring Provider: Celene Squibb, MD Primary Care Physician:  Celene Squibb, MD Primary GI Physician: Dr. Gala Romney  No chief complaint on file.   HPI:   Gregory Wilson is a 81 y.o. male  with GI history of adenomatous colon polyps due for surveillance in November 2024 if health permits, chronic constipation, GERD, dysphagia with empiric dilations previously (last in November 2021), presenting today for follow-up of nausea without vomiting.  He was last seen in our office in November 2020 reporting 1 week of intermittent nausea with gagging without identified trigger, typically occurring in the mornings, improved with drinking water or taking Nexium. Mild chronic soreness in the epigastric area without change.  Denies BRBPR, melena, association with meals, or change in bowel habits.  Had been taking Nexium 20-40 mg as needed for reflux (1-2 times per week).  Since nausea started, he has been taking Nexium daily and symptoms were already improving.  Suspected symptoms were secondary to GERD.  Increase sinus drainage could also be contributing.  Recommended Nexium 40 mg daily, follow-up with PCP for sinus drainage.  Linzess 145 as needed also continued as this was working well for him.  Recommended 17-monthfollow-up and consider decreasing Nexium back to as needed if doing well.   Patient cancelled follow-up on 4/3.   Today:  Nausea:  Constipation:     Past Medical History:  Diagnosis Date   Acute ST elevation myocardial infarction (STEMI) of anterior wall (HCC) 03/07/2017   Arthritis    BPH (benign prostatic hypertrophy)    CAD (coronary artery disease)    a. Lexiscan Myoview (11/2013):  Normal stress nuclear study.  LV Ejection Fraction: 609%  Complication of anesthesia    hx PONV   Degenerative joint disease    GERD (gastroesophageal reflux disease)    H/O hiatal hernia    History of atrial flutter    REMOTE HX ON TREADMILL   History of colon polyps    Hyperlipidemia     Hypertension    IBS (irritable bowel syndrome)    Lower urinary tract symptoms (LUTS)    OSA (obstructive sleep apnea) 03/26/2016   RBBB (right bundle branch block)    S/P drug eluting coronary stent placement    11-26-2001   X2 DES  TO LAD &  BMS   Schatzki's ring    Status post dilation of esophageal narrowing     Past Surgical History:  Procedure Laterality Date   APPENDECTOMY     CARDIOVASCULAR STRESS TEST  12/09/2013   NUCLEAR LEXISCAN STUDY--  RESULTS PENDING   CARDIOVASCULAR STRESS TEST  04/2011  DR CRENSHAW   NORMAL /  NO ISCHEMIA/  EF 67%   CHOLECYSTECTOMY     COLONOSCOPY  05/29/2011   RMR: tubular adenoma   COLONOSCOPY WITH PROPOFOL N/A 05/23/2020   ;  Surgeon: RDaneil Dolin MD;  Nonbleeding internal hemorrhoids, diverticulosis in sigmoid and descending colon, 10 polyps removed.Pathology revealed tubular adenomas.  Recommended repeat colonoscopy in 3 years if health permits.   CORONARY ANGIOPLASTY WITH STENT PLACEMENT  11-26-2001  DR BEustace Quail  DES (Cypher) to LAD 70%/  BMS (Express) to OSTIAL D1 80%/   CFX  &  RCA normal/  EF 65%   CORONARY/GRAFT ACUTE MI REVASCULARIZATION N/A 03/07/2017   Procedure: Coronary/Graft Acute MI Revascularization;  Surgeon: CSherren Mocha MD;  [facility];  Service: Cardiovascular;  Laterality: N/A;   ESOPHAGOGASTRODUODENOSCOPY  04/05/2004   RMR: Small hiatal  hernia/Questionable subtle Schatzki's ring, status post dilation as described   ESOPHAGOGASTRODUODENOSCOPY  05/2011   RMR: small hiatal hernia, empiric dilation with 22F and 42F Maloney   ESOPHAGOGASTRODUODENOSCOPY (EGD) WITH ESOPHAGEAL DILATION N/A 01/16/2013   Dr. Rourk:baggy somewhat atonic esophagus-query underlying esophageal motility disorder. Status post passage of a Maloney dilator (large bore) empirically. Status post esophageal biopsy/Reflux symptoms are well controlled on Nexium.benign path   ESOPHAGOGASTRODUODENOSCOPY (EGD) WITH PROPOFOL N/A 05/23/2020    Surgeon: Daneil Dolin, MD; Somewhat baggy esophageal body, otherwise normal esophagus s/p empiric dilation, small hiatal hernia, otherwise normal exam.   LEFT HEART CATH AND CORONARY ANGIOGRAPHY N/A 03/07/2017   Procedure: LEFT HEART CATH AND CORONARY ANGIOGRAPHY;  Surgeon: Sherren Mocha, MD;  Location: [place] [facility];  Service: Cardiovascular;  Laterality: N/A;   MALONEY DILATION N/A 05/23/2020   Procedure: Venia Minks DILATION;  Surgeon: Daneil Dolin, MD;  [facility];  Service: Endoscopy;  Laterality: N/A;   POLYPECTOMY  05/23/2020   Procedure: POLYPECTOMY;  Surgeon: Daneil Dolin, MD;  [facility];  Service: Endoscopy;;   TOTAL HIP ARTHROPLASTY Left 09/07/2003   TOTAL KNEE ARTHROPLASTY Left 07/27/2002   TRANSURETHRAL RESECTION OF PROSTATE N/A 12/21/2013   Procedure: TRANSURETHRAL RESECTION OF THE PROSTATE WITH GYRUS INSTRUMENTS;  Surgeon: Jorja Loa, MD;  Location[facility];  Service: Urology;  Laterality: N/A;    Current Outpatient Medications  Medication Sig Dispense Refill   amLODipine (NORVASC) 10 MG tablet Take 1 tablet (10 mg total) by mouth daily. 90 tablet 2   aspirin EC 81 MG EC tablet Take 1 tablet (81 mg total) by mouth daily.     atorvastatin (LIPITOR) 80 MG tablet TAKE 1 TABLET EVERY DAY  AT  6PM (KEEP MD APPOINTMENT) 90 tablet 3   esomeprazole (NEXIUM) 40 MG capsule Take 1 capsule (40 mg total) by mouth daily before breakfast. 30 capsule 3   fluticasone (FLONASE) 50 MCG/ACT nasal spray Place 1 spray into both nostrils daily as needed for allergies.      furosemide (LASIX) 20 MG tablet TAKE 1 TABLET(20 MG) BY MOUTH DAILY 90 tablet 3   ibuprofen (ADVIL) 800 MG tablet Take 800 mg by mouth every 8 (eight) hours as needed for moderate pain.      irbesartan (AVAPRO) 300 MG tablet Take 1 tablet (300 mg total) by mouth daily. 90 tablet 3   LINZESS 145 MCG CAPS capsule Take 145 mcg by mouth daily as needed (constipation).       metoprolol succinate (TOPROL-XL) 100 MG 24 hr tablet Take 1 tablet (100 mg total) by mouth daily. 90 tablet 2   PYRIDOXINE HCL PO Take 2 tablets by mouth daily.     TURMERIC-GINGER PO Take 2 tablets by mouth daily.     No current facility-administered medications for this visit.    Allergies as of 10/19/2021 - Review Complete 07/25/2021  Allergen Reaction Noted   Codeine Nausea And Vomiting 10/28/2008    Family History  Problem Relation Age of Onset   Cancer Father        Lung   Hypertension Mother    Coronary artery disease Mother    Kidney failure Mother    Cancer Sister        breast carcinoma   Hypertension Sister    Colon cancer Neg Hx     Social History   Socioeconomic History   Marital status: Married    Spouse name: Not on file  Number of children: 2   Years of education: Not on file   Highest education level: Not on file  Occupational History   Occupation: retired  Tobacco Use   Smoking status: Never   Smokeless tobacco: Never  Vaping Use   Vaping Use: Never used  Substance and Sexual Activity   Alcohol use: Not Currently    Comment: on holidays   Drug use: No   Sexual activity: Never  Other Topics Concern   Not on file  Social History Narrative   Retired from [employer] at home with wife   Right handed   Caffeine: none   Social Determinants of Health   Financial Resource Strain: Not on file  Food Insecurity: Not on file  Transportation Needs: Not on file  Physical Activity: Not on file  Stress: Not on file  Social Connections: Not on file    Review of Systems: Gen: Denies fever, chills, anorexia. Denies fatigue, weakness, weight loss.  CV: Denies chest pain, palpitations, syncope, peripheral edema, and claudication. Resp: Denies dyspnea at rest, cough, wheezing, coughing up blood, and pleurisy. GI: Denies vomiting blood, jaundice, and fecal incontinence.   Denies dysphagia or odynophagia. Derm: Denies rash,  itching, dry skin Psych: Denies depression, anxiety, memory loss, confusion. No homicidal or suicidal ideation.  Heme: Denies bruising, bleeding, and enlarged lymph nodes.  Physical Exam: There were no vitals taken for this visit. General:   Alert and oriented. No distress noted. Pleasant and cooperative.  Head:  Normocephalic and atraumatic. Eyes:  Conjuctiva clear without scleral icterus. Heart:  S1, S2 present without murmurs appreciated. Lungs:  Clear to auscultation bilaterally. No wheezes, rales, or rhonchi. No distress.  Abdomen:  +BS, soft, non-tender and non-distended. No rebound or guarding. No HSM or masses noted. Msk:  Symmetrical without gross deformities. Normal posture. Extremities:  Without edema. Neurologic:  Alert and  oriented x4 Psych:  Normal mood and affect.    Assessment:     Plan:  ***   Aliene Altes, PA-C [facility] 10/19/2021

## 2021-10-19 ENCOUNTER — Ambulatory Visit: Payer: Medicare PPO | Admitting: Gastroenterology

## 2021-10-30 ENCOUNTER — Encounter: Payer: Self-pay | Admitting: Gastroenterology

## 2021-10-30 ENCOUNTER — Ambulatory Visit (INDEPENDENT_AMBULATORY_CARE_PROVIDER_SITE_OTHER): Payer: Medicare PPO | Admitting: Gastroenterology

## 2021-10-30 VITALS — BP 130/78 | HR 74 | Temp 98.4°F | Ht 73.0 in | Wt 315.6 lb

## 2021-10-30 DIAGNOSIS — K5900 Constipation, unspecified: Secondary | ICD-10-CM

## 2021-10-30 DIAGNOSIS — R110 Nausea: Secondary | ICD-10-CM

## 2021-10-30 DIAGNOSIS — K219 Gastro-esophageal reflux disease without esophagitis: Secondary | ICD-10-CM

## 2021-10-30 NOTE — Progress Notes (Signed)
? ?GI Office Note   ? ?Referring Provider: Celene Squibb, MD ?Primary Care Physician:  Celene Squibb, MD ?Primary GI: Dr. Gala Romney ? ?Date:  10/30/2021  ?ID:  Gregory Wilson, DOB July 20, 1940, MRN [id number] ? ? ?Chief Complaint  ? ?Chief Complaint  ?Patient presents with  ? Constipation  ? ? ? ?History of Present Illness  ?Gregory Wilson is a 81 y.o. male presenting today with a history of adenomatous colon polyps, chronic intermittent constipation, GERD, dysphagia with empiric dilations, STEMI, CAD, HLD, HTN, OSA presenting today for follow-up of nausea, GERD, and constipation. ? ?Last EGD 05/23/2020: Somewhat boggy esophageal body, otherwise normal esophagus s/p empiric dilation, small hiatal hernia, exam normal otherwise ? ?Last colonoscopy 05/23/2020: Nonbleeding internal hemorrhoids, diverticulosis in the sigmoid and descending colon, ten 3 to 7 mm polyps in the descending colon and at the hepatic flexure (biopsies with tubular adenoma) ? ?Last seen in our office November 2022 - he was having nausea with gagging for 1 week prior to visit about 1-2 times a day that was improved with taking Nexium and drinking cold water.  Symptoms were most commonly in the morning.  Was not eating within 3 hours of going to bed however was consuming spicy, fried, fatty foods frequently and adding hot sauce to his food.  He was only taking the Nexium as needed.  Is also reporting a lot of sinus drainage.  Using Linzess 145 mcg as needed to control his constipation (difficult for him to afford).  He was advised to resume Nexium 40 mg daily, counseled on GERD lifestyle modifications, and advised to continue Linzess 145 mcg daily as needed.  Pending symptoms, advised to consider reducing Nexium back to as needed if symptoms had slightly improved. ? ? ?Today:  ? ?GERD -currently on Nexium 40 mg every day.  He reports he was previously only taking as needed however his PCP advised him to begin taking it every day.  He denies any dysphagia,  odynophagia, burning, unintentional weight loss, or hematemesis.  He does have nausea however this is chronic and believes it is related to his medications.  He reports not eating within 2 to 3 hours prior to going to bed however he is still eating a diet that is frequent and consumption of fatty/greasy and spicy foods.  He reports he puts hot sauce on almost everything. ? ?Nausea: No reports of vomiting.  He does report a nauseous type feeling almost daily however he contributes it to his medications.  He does not have any lack of appetite or unintentional weight loss.  He reports that he believes tramadol is the most likely culprit. ? ?Constipation: Patient reports that is normal for him not to go to the bathroom every day.  He is still taking Linzess 145 mcg as needed, requesting samples today.  He reports he has tried MiraLAX in the past and it was somewhat helpful but was only mostly helpful with his colonoscopy prep.  He reports that if he takes Linzess multiple days in a row that he has significant diarrhea.  He reports prescription was sent in for him but it was too expensive, costing him about $400 a supply.  He reports that sometimes he will have a bowel movement after drinking a Coke cola.  He reports drinking about 3 fours a gallon to 1 gallon of water daily.  He reports he is satisfied with his current regimen and not interested in changing dosages or sending in a new prescription. ? ?  Past Medical History:  ?Diagnosis Date  ? Acute ST elevation myocardial infarction (STEMI) of anterior wall ([place]) 03/07/2017  ? Arthritis   ? BPH (benign prostatic hypertrophy)   ? CAD (coronary artery disease)   ? a. Lexiscan Myoview (11/2013):  Normal stress nuclear study.  LV Ejection Fraction: 65%  ? Complication of anesthesia   ? hx PONV  ? Degenerative joint disease   ? GERD (gastroesophageal reflux disease)   ? H/O hiatal hernia   ? History of atrial flutter   ? REMOTE HX ON TREADMILL  ? History of colon polyps   ?  Hyperlipidemia   ? Hypertension   ? IBS (irritable bowel syndrome)   ? Lower urinary tract symptoms (LUTS)   ? OSA (obstructive sleep apnea) 03/26/2016  ? RBBB (right bundle branch block)   ? S/P drug eluting coronary stent placement   ? 11-26-2001   X2 DES  TO LAD &  BMS  ? Schatzki's ring   ? Status post dilation of esophageal narrowing   ? ? ?Past Surgical History:  ?Procedure Laterality Date  ? APPENDECTOMY    ? CARDIOVASCULAR STRESS TEST  12/09/2013  ? NUCLEAR LEXISCAN STUDY--  RESULTS PENDING  ? CARDIOVASCULAR STRESS TEST  04/2011  DR CRENSHAW  ? NORMAL /  NO ISCHEMIA/  EF 67%  ? CHOLECYSTECTOMY    ? COLONOSCOPY  05/29/2011  ? RMR: tubular adenoma  ? COLONOSCOPY WITH PROPOFOL N/A 05/23/2020  ? ;  Surgeon: Daneil Dolin, MD;  Nonbleeding internal hemorrhoids, diverticulosis in sigmoid and descending colon, 10 polyps removed.Pathology revealed tubular adenomas.  Recommended repeat colonoscopy in 3 years if health permits.  ? CORONARY ANGIOPLASTY WITH STENT PLACEMENT  11-26-2001  DR Eustace Quail  ? DES (Cypher) to LAD 70%/  BMS (Express) to OSTIAL D1 80%/   CFX  &  RCA normal/  EF 65%  ? CORONARY/GRAFT ACUTE MI REVASCULARIZATION N/A 03/07/2017  ? Procedure: Coronary/Graft Acute MI Revascularization;  Surgeon: Sherren Mocha, MD;  Location: [place] [facility];  Service: Cardiovascular;  Laterality: N/A;  ? ESOPHAGOGASTRODUODENOSCOPY  04/05/2004  ? RMR: Small hiatal hernia/Questionable subtle Schatzki's ring, status post dilation as described  ? ESOPHAGOGASTRODUODENOSCOPY  05/2011  ? RMR: small hiatal hernia, empiric dilation with 22F and 67F Maloney  ? ESOPHAGOGASTRODUODENOSCOPY (EGD) WITH ESOPHAGEAL DILATION N/A 01/16/2013  ? Dr. Rourk:baggy somewhat atonic esophagus-query underlying esophageal motility disorder. Status post passage of a Maloney dilator (large bore) empirically. Status post esophageal biopsy/Reflux symptoms are well controlled on Nexium.benign path  ? ESOPHAGOGASTRODUODENOSCOPY (EGD) WITH  PROPOFOL N/A 05/23/2020  ? Surgeon: Daneil Dolin, MD; Somewhat baggy esophageal body, otherwise normal esophagus s/p empiric dilation, small hiatal hernia, otherwise normal exam.  ? LEFT HEART CATH AND CORONARY ANGIOGRAPHY N/A 03/07/2017  ? Procedure: LEFT HEART CATH AND CORONARY ANGIOGRAPHY;  Surgeon: Sherren Mocha, MD;  Location: [facility];  Service: Cardiovascular;  Laterality: N/A;  ? MALONEY DILATION N/A 05/23/2020  ? Procedure: MALONEY DILATION;  Surgeon: Daneil Dolin, MD;  [facility];  Service: Endoscopy;  Laterality: N/A;  ? POLYPECTOMY  05/23/2020  ? Procedure: POLYPECTOMY;  Surgeon: Daneil Dolin, MD;  [facility];  Service: Endoscopy;;  ? TOTAL HIP ARTHROPLASTY Left 09/07/2003  ? TOTAL KNEE ARTHROPLASTY Left 07/27/2002  ? TRANSURETHRAL RESECTION OF PROSTATE N/A 12/21/2013  ? Procedure: TRANSURETHRAL RESECTION OF THE PROSTATE WITH GYRUS INSTRUMENTS;  Surgeon: Jorja Loa, MD;  Location: [facility];  Service: Urology;  Laterality: N/A;  ? ? ?Current Outpatient Medications  ?Medication Sig Dispense Refill  ? amLODipine (NORVASC) 10 MG tablet Take 1 tablet (10 mg total) by mouth daily. 90 tablet 2  ? aspirin EC 81 MG EC tablet Take 1 tablet (81 mg total) by mouth daily.    ? atorvastatin (LIPITOR) 80 MG tablet TAKE 1 TABLET EVERY DAY  AT  6PM (KEEP MD APPOINTMENT) 90 tablet 3  ? esomeprazole (NEXIUM) 40 MG capsule Take 1 capsule (40 mg total) by mouth daily before breakfast. 30 capsule 3  ? fluticasone (FLONASE) 50 MCG/ACT nasal spray Place 1 spray into both nostrils daily as needed for allergies.     ? irbesartan (AVAPRO) 300 MG tablet Take 1 tablet (300 mg total) by mouth daily. 90 tablet 3  ? LINZESS 145 MCG CAPS capsule Take 145 mcg by mouth daily as needed (constipation).     ? metoprolol succinate (TOPROL-XL) 100 MG 24 hr tablet Take 1 tablet (100 mg total) by mouth daily. 90 tablet 2  ? traMADol (ULTRAM) 50 MG tablet Take 50 mg by  mouth.    ? TURMERIC-GINGER PO Take 2 tablets by mouth daily.    ? ?No current facility-administered medications for this visit.  ? ? ?Allergies as of 10/30/2021 - Review Complete 10/30/2021  ?Allergen Reaction

## 2021-10-30 NOTE — Patient Instructions (Addendum)
It was great seeing you today! ? ?We will continue Nexium 40 mg daily 30 minutes before breakfast. ?I encourage you to follow GERD diet is much as possible.  This includes reducing your intake of fatty/greasy and spicy foods foods and avoiding eating within 2 to 3 hours of going to bed. ? ?For your constipation I want you to continue Linzess 145 mcg as needed, we are providing you some samples today.  Continue adequate water intake.  Constipation could also improve by limiting intake of fatty foods.  We could always submit for patient assistance to see if we can make the Linzess more affordable for you. ? ?Please also begin taking Benefiber 2 teaspoons daily (brand name or generic is fine) as long as it is clear soluble fiber. ? ?For nausea I recommend that you take your pain medication with food not on empty stomach.  This goes for majority of your medications as this could be cause for nausea. ? ?We will have you follow-up in 6 months.  ? ?It was a pleasure to see you today. I want to create trusting relationships with patients. If you receive a survey regarding your visit,  I greatly appreciate you taking time to fill this out on paper or through your MyChart. I value your feedback. ? ?Venetia Night, MSN, FNP-BC, AGACNP-BC ?[facility] ? ? ?

## 2021-11-09 DIAGNOSIS — G894 Chronic pain syndrome: Secondary | ICD-10-CM | POA: Diagnosis not present

## 2021-11-09 DIAGNOSIS — M545 Low back pain, unspecified: Secondary | ICD-10-CM | POA: Diagnosis not present

## 2021-11-29 ENCOUNTER — Emergency Department (HOSPITAL_COMMUNITY)
Admission: EM | Admit: 2021-11-29 | Discharge: 2021-11-30 | Disposition: A | Discharge status: Home / Self Care | Payer: Medicare PPO | Attending: Emergency Medicine | Admitting: Emergency Medicine

## 2021-11-29 ENCOUNTER — Other Ambulatory Visit: Payer: Self-pay

## 2021-11-29 ENCOUNTER — Encounter (HOSPITAL_COMMUNITY): Payer: Self-pay

## 2021-11-29 DIAGNOSIS — Z96652 Presence of left artificial knee joint: Secondary | ICD-10-CM | POA: Diagnosis not present

## 2021-11-29 DIAGNOSIS — Z9861 Coronary angioplasty status: Secondary | ICD-10-CM | POA: Insufficient documentation

## 2021-11-29 DIAGNOSIS — E785 Hyperlipidemia, unspecified: Secondary | ICD-10-CM | POA: Diagnosis not present

## 2021-11-29 DIAGNOSIS — I10 Essential (primary) hypertension: Secondary | ICD-10-CM | POA: Insufficient documentation

## 2021-11-29 DIAGNOSIS — I129 Hypertensive chronic kidney disease with stage 1 through stage 4 chronic kidney disease, or unspecified chronic kidney disease: Secondary | ICD-10-CM | POA: Diagnosis not present

## 2021-11-29 DIAGNOSIS — Z96642 Presence of left artificial hip joint: Secondary | ICD-10-CM | POA: Insufficient documentation

## 2021-11-29 DIAGNOSIS — I2510 Atherosclerotic heart disease of native coronary artery without angina pectoris: Secondary | ICD-10-CM | POA: Insufficient documentation

## 2021-11-29 DIAGNOSIS — M545 Low back pain, unspecified: Secondary | ICD-10-CM | POA: Insufficient documentation

## 2021-11-29 DIAGNOSIS — E1122 Type 2 diabetes mellitus with diabetic chronic kidney disease: Secondary | ICD-10-CM | POA: Diagnosis not present

## 2021-11-29 DIAGNOSIS — M549 Dorsalgia, unspecified: Secondary | ICD-10-CM | POA: Diagnosis not present

## 2021-11-29 DIAGNOSIS — N1831 Chronic kidney disease, stage 3a: Secondary | ICD-10-CM | POA: Diagnosis not present

## 2021-11-29 NOTE — ED Triage Notes (Signed)
Pt presents with chronic lower back pain that has gotten worse over the last few days. Pt has history of degenerative joint disease. Pt has taken tramadol with no relief.

## 2021-11-30 ENCOUNTER — Emergency Department (HOSPITAL_COMMUNITY): Payer: Medicare PPO

## 2021-11-30 DIAGNOSIS — M545 Low back pain, unspecified: Secondary | ICD-10-CM | POA: Diagnosis not present

## 2021-11-30 MED ORDER — LIDOCAINE 5 % EX PTCH: 1.0000 | MEDICATED_PATCH | CUTANEOUS | 0 refills | Status: DC

## 2021-11-30 MED ORDER — OXYCODONE HCL 5 MG PO TABS: 5.0000 mg | ORAL_TABLET | ORAL | 0 refills | Status: DC | PRN

## 2021-11-30 MED ORDER — METHOCARBAMOL 500 MG PO TABS: 500.0000 mg | ORAL_TABLET | Freq: Three times a day (TID) | ORAL | 0 refills | Status: DC | PRN

## 2021-11-30 MED ORDER — ACETAMINOPHEN 500 MG PO TABS
1000.0000 mg | ORAL_TABLET | Freq: Once | ORAL | Status: AC
  Administered 2021-11-30: 1000 mg via ORAL
  Filled 2021-11-30: qty 2

## 2021-11-30 MED ORDER — LIDOCAINE 5 % EX PTCH
1.0000 | MEDICATED_PATCH | CUTANEOUS | Status: DC
  Administered 2021-11-30: 1 via TRANSDERMAL
  Filled 2021-11-30: qty 1

## 2021-11-30 MED ORDER — METHOCARBAMOL 500 MG PO TABS
1000.0000 mg | ORAL_TABLET | Freq: Once | ORAL | Status: AC
  Administered 2021-11-30: 1000 mg via ORAL
  Filled 2021-11-30: qty 2

## 2021-11-30 MED ORDER — OXYCODONE HCL 5 MG PO TABS
10.0000 mg | ORAL_TABLET | Freq: Once | ORAL | Status: AC
  Administered 2021-11-30: 10 mg via ORAL
  Filled 2021-11-30: qty 2

## 2021-11-30 NOTE — Discharge Instructions (Signed)
You were evaluated in the Emergency Department and after careful evaluation, we did not find any emergent condition requiring admission or further testing in the hospital.  Your exam/testing today is overall reassuring.  Your x-ray is showing degenerative disc disease in your lower back.  Important that you keep your follow-up with the pain experts and the spine experts.  Recommend Tylenol 1000 mg every 4-6 hours.  Also recommend using the numbing patches provided as directed.  Can use the Robaxin or the oxycodone for more significant pain.  Do not mix these medications as both of them can cause drowsiness.  Please return to the Emergency Department if you experience any worsening of your condition.   Thank you for allowing [place] to be a part of your care.

## 2021-11-30 NOTE — ED Provider Notes (Signed)
[facility] Emergency Department Provider Note MRN:  [id number]  Arrival date & time: 11/30/21     Chief Complaint   Back Pain   History of Present Illness   Gregory Wilson is a 81 y.o. year-old male with CAD presenting to the ED with chief complaint of back pain.  3 days of gradual onset back pain, worsening.  Worse with movement.  Denies any fall or inciting event.  No fever, no numbness or weakness to the arms or legs, no bowel or bladder dysfunction.  Review of Systems  A thorough review of systems was obtained and all systems are negative except as noted in the HPI and PMH.   Patient's Health History    Past Medical History:  Diagnosis Date   Acute ST elevation myocardial infarction (STEMI) of anterior wall ([place]) 03/07/2017   Arthritis    BPH (benign prostatic hypertrophy)    CAD (coronary artery disease)    a. Lexiscan Myoview (11/2013):  Normal stress nuclear study.  LV Ejection Fraction: 47%   Complication of anesthesia    hx PONV   Degenerative joint disease    GERD (gastroesophageal reflux disease)    H/O hiatal hernia    History of atrial flutter    REMOTE HX ON TREADMILL   History of colon polyps    Hyperlipidemia    Hypertension    IBS (irritable bowel syndrome)    Lower urinary tract symptoms (LUTS)    OSA (obstructive sleep apnea) 03/26/2016   RBBB (right bundle branch block)    S/P drug eluting coronary stent placement    11-26-2001   X2 DES  TO LAD &  BMS   Schatzki's ring    Status post dilation of esophageal narrowing     Past Surgical History:  Procedure Laterality Date   APPENDECTOMY     CARDIOVASCULAR STRESS TEST  12/09/2013   NUCLEAR LEXISCAN STUDY--  RESULTS PENDING   CARDIOVASCULAR STRESS TEST  04/2011  DR CRENSHAW   NORMAL /  NO ISCHEMIA/  EF 67%   CHOLECYSTECTOMY     COLONOSCOPY  05/29/2011   RMR: tubular adenoma   COLONOSCOPY WITH PROPOFOL N/A 05/23/2020   ;  Surgeon: Daneil Dolin, MD;  Nonbleeding internal  hemorrhoids, diverticulosis in sigmoid and descending colon, 10 polyps removed.Pathology revealed tubular adenomas.  Recommended repeat colonoscopy in 3 years if health permits.   CORONARY ANGIOPLASTY WITH STENT PLACEMENT  11-26-2001  DR Eustace Quail   DES (Cypher) to LAD 70%/  BMS (Express) to OSTIAL D1 80%/   CFX  &  RCA normal/  EF 65%   CORONARY/GRAFT ACUTE MI REVASCULARIZATION N/A 03/07/2017   Procedure: Coronary/Graft Acute MI Revascularization;  Surgeon: Sherren Mocha, MD;  Location: [place] [facility];  Service: Cardiovascular;  Laterality: N/A;   ESOPHAGOGASTRODUODENOSCOPY  04/05/2004   RMR: Small hiatal hernia/Questionable subtle Schatzki's ring, status post dilation as described   ESOPHAGOGASTRODUODENOSCOPY  05/2011   RMR: small hiatal hernia, empiric dilation with 65F and 56F Maloney   ESOPHAGOGASTRODUODENOSCOPY (EGD) WITH ESOPHAGEAL DILATION N/A 01/16/2013   Dr. Rourk:baggy somewhat atonic esophagus-query underlying esophageal motility disorder. Status post passage of a Maloney dilator (large bore) empirically. Status post esophageal biopsy/Reflux symptoms are well controlled on Nexium.benign path   ESOPHAGOGASTRODUODENOSCOPY (EGD) WITH PROPOFOL N/A 05/23/2020   Surgeon: Daneil Dolin, MD; Somewhat baggy esophageal body, otherwise normal esophagus s/p empiric dilation, small hiatal hernia, otherwise normal exam.   LEFT HEART CATH AND CORONARY ANGIOGRAPHY N/A 03/07/2017  Procedure: LEFT HEART CATH AND CORONARY ANGIOGRAPHY;  Surgeon: Sherren Mocha, MD;  Location: [place] [facility];  Service: Cardiovascular;  Laterality: N/A;   MALONEY DILATION N/A 05/23/2020   Procedure: Venia Minks DILATION;  Surgeon: Daneil Dolin, MD;  [facility];  Service: Endoscopy;  Laterality: N/A;   POLYPECTOMY  05/23/2020   Procedure: POLYPECTOMY;  Surgeon: Daneil Dolin, MD;  [facility];  Service: Endoscopy;;   TOTAL HIP ARTHROPLASTY Left 09/07/2003   TOTAL KNEE ARTHROPLASTY  Left 07/27/2002   TRANSURETHRAL RESECTION OF PROSTATE N/A 12/21/2013   Procedure: TRANSURETHRAL RESECTION OF THE PROSTATE WITH GYRUS INSTRUMENTS;  Surgeon: Jorja Loa, MD;  Location: [facility];  Service: Urology;  Laterality: N/A;    Family History  Problem Relation Age of Onset   Cancer Father        Lung   Hypertension Mother    Coronary artery disease Mother    Kidney failure Mother    Cancer Sister        breast carcinoma   Hypertension Sister    Colon cancer Neg Hx     Social History   Socioeconomic History   Marital status: Married    Spouse name: Not on file   Number of children: 2   Years of education: Not on file   Highest education level: Not on file  Occupational History   Occupation: retired  Tobacco Use   Smoking status: Never   Smokeless tobacco: Never  Vaping Use   Vaping Use: Never used  Substance and Sexual Activity   Alcohol use: Not Currently    Comment: on holidays   Drug use: No   Sexual activity: Never  Other Topics Concern   Not on file  Social History Narrative   Retired from [employer] at home with wife   Right handed   Caffeine: none   Social Determinants of Health   Financial Resource Strain: Not on file  Food Insecurity: Not on file  Transportation Needs: Not on file  Physical Activity: Not on file  Stress: Not on file  Social Connections: Not on file  Intimate Partner Violence: Not on file     Physical Exam   Vitals:   11/29/21 2134 11/30/21 0257  BP: (!) 142/73 138/79  Pulse: 74 78  Resp: 19 18  Temp: 98.1 F (36.7 C)   SpO2: 97% 100%    CONSTITUTIONAL: Well-appearing, NAD NEURO/PSYCH:  Alert and oriented x 3, no focal deficits EYES:  eyes equal and reactive ENT/NECK:  no LAD, no JVD CARDIO: Regular rate, well-perfused, normal S1 and S2 PULM:  CTAB no wheezing or rhonchi GI/GU:  non-distended, non-tender MSK/SPINE:  No gross deformities, no edema SKIN:  no rash,  atraumatic   *Additional and/or pertinent findings included in MDM below  Diagnostic and Interventional Summary    EKG Interpretation  Date/Time:    Ventricular Rate:    PR Interval:    QRS Duration:   QT Interval:    QTC Calculation:   R Axis:     Text Interpretation:         Labs Reviewed - No data to display  DG Lumbar Spine Complete  Final Result      Medications  lidocaine (LIDODERM) 5 % 1 patch (1 patch Transdermal Patch Applied 11/30/21 0053)  oxyCODONE (Oxy IR/ROXICODONE) immediate release tablet 10 mg (10 mg Oral Given 11/30/21 0053)  acetaminophen (TYLENOL) tablet 1,000 mg (1,000 mg Oral  Given 11/30/21 0053)  methocarbamol (ROBAXIN) tablet 1,000 mg (1,000 mg Oral Given 11/30/21 0052)     Procedures  /  Critical Care Procedures  ED Course and Medical Decision Making  Initial Impression and Ddx Symptoms consistent with MSK pain.  No radiation to the legs, no neurological deficits, no bowel or bladder dysfunction, nothing to suggest myelopathy.  Doubt intra-abdominal process given that he is tender to the musculature and is hesitant to move.  Given age and lack of inciting event will obtain screening x-ray, and attempt pain control.  Past medical/surgical history that increases complexity of ED encounter: Chronic back pain  Interpretation of Diagnostics I personally reviewed the lumbar x-ray and my interpretation is as follows: Degenerative disc disease, no obvious fracture    Patient Reassessment and Ultimate Disposition/Management     Upon further questioning this is more of an acute on chronic back pain, he has follow-up with the pain specialist in the spine specialist in a week or 2.  He is feeling better after medications listed above, able to stand and ambulate, has a walker at home.  We discussed management options, offered case management/social work in the morning but patient prefers going home, trying to new medicines, following up with  specialist.  Patient management required discussion with the following services or consulting groups:  None  Complexity of Problems Addressed Chronic illness with exacerbation  Additional Data Reviewed and Analyzed Further history obtained from: Further history from spouse/family member  Additional Factors Impacting ED Encounter Risk Prescriptions and Consideration of hospitalization  Barth Kirks. [place] Small, [place] [email or web address]  Final Clinical Impressions(s) / ED Diagnoses     ICD-10-CM   1. Midline low back pain without sciatica, unspecified chronicity  M54.50       ED Discharge Orders          Ordered    methocarbamol (ROBAXIN) 500 MG tablet  Every 8 hours PRN        11/30/21 0256    oxyCODONE (ROXICODONE) 5 MG immediate release tablet  Every 4 hours PRN        11/30/21 0256    lidocaine (LIDODERM) 5 %  Every 24 hours        11/30/21 0256             Discharge Instructions Discussed with and Provided to Patient:     Discharge Instructions      You were evaluated in the Emergency Department and after careful evaluation, we did not find any emergent condition requiring admission or further testing in the hospital.  Your exam/testing today is overall reassuring.  Your x-ray is showing degenerative disc disease in your lower back.  Important that you keep your follow-up with the pain experts and the spine experts.  Recommend Tylenol 1000 mg every 4-6 hours.  Also recommend using the numbing patches provided as directed.  Can use the Robaxin or the oxycodone for more significant pain.  Do not mix these medications as both of them can cause drowsiness.  Please return to the Emergency Department if you experience any worsening of your condition.   Thank you for allowing [place] to be a part of your care.       Maudie Flakes, MD 11/30/21 [phone]

## 2021-12-19 DIAGNOSIS — G894 Chronic pain syndrome: Secondary | ICD-10-CM | POA: Diagnosis not present

## 2021-12-19 DIAGNOSIS — M5416 Radiculopathy, lumbar region: Secondary | ICD-10-CM | POA: Diagnosis not present

## 2021-12-19 DIAGNOSIS — M48061 Spinal stenosis, lumbar region without neurogenic claudication: Secondary | ICD-10-CM | POA: Diagnosis not present

## 2021-12-19 DIAGNOSIS — F1120 Opioid dependence, uncomplicated: Secondary | ICD-10-CM | POA: Diagnosis not present

## 2021-12-21 DIAGNOSIS — I10 Essential (primary) hypertension: Secondary | ICD-10-CM | POA: Diagnosis not present

## 2021-12-21 DIAGNOSIS — Z125 Encounter for screening for malignant neoplasm of prostate: Secondary | ICD-10-CM | POA: Diagnosis not present

## 2021-12-21 DIAGNOSIS — E1122 Type 2 diabetes mellitus with diabetic chronic kidney disease: Secondary | ICD-10-CM | POA: Diagnosis not present

## 2021-12-29 DIAGNOSIS — K5900 Constipation, unspecified: Secondary | ICD-10-CM | POA: Diagnosis not present

## 2021-12-29 DIAGNOSIS — E1122 Type 2 diabetes mellitus with diabetic chronic kidney disease: Secondary | ICD-10-CM | POA: Diagnosis not present

## 2021-12-29 DIAGNOSIS — M25551 Pain in right hip: Secondary | ICD-10-CM | POA: Diagnosis not present

## 2021-12-29 DIAGNOSIS — M25561 Pain in right knee: Secondary | ICD-10-CM | POA: Diagnosis not present

## 2021-12-29 DIAGNOSIS — E782 Mixed hyperlipidemia: Secondary | ICD-10-CM | POA: Diagnosis not present

## 2021-12-29 DIAGNOSIS — M545 Low back pain, unspecified: Secondary | ICD-10-CM | POA: Diagnosis not present

## 2021-12-29 DIAGNOSIS — K219 Gastro-esophageal reflux disease without esophagitis: Secondary | ICD-10-CM | POA: Diagnosis not present

## 2021-12-29 DIAGNOSIS — Z955 Presence of coronary angioplasty implant and graft: Secondary | ICD-10-CM | POA: Diagnosis not present

## 2022-01-03 ENCOUNTER — Encounter: Payer: Self-pay | Admitting: Cardiology

## 2022-01-08 DIAGNOSIS — M4804 Spinal stenosis, thoracic region: Secondary | ICD-10-CM | POA: Diagnosis not present

## 2022-01-08 DIAGNOSIS — M4807 Spinal stenosis, lumbosacral region: Secondary | ICD-10-CM | POA: Diagnosis not present

## 2022-01-08 DIAGNOSIS — M5416 Radiculopathy, lumbar region: Secondary | ICD-10-CM | POA: Diagnosis not present

## 2022-01-08 DIAGNOSIS — M5136 Other intervertebral disc degeneration, lumbar region: Secondary | ICD-10-CM | POA: Diagnosis not present

## 2022-01-08 DIAGNOSIS — R269 Unspecified abnormalities of gait and mobility: Secondary | ICD-10-CM | POA: Diagnosis not present

## 2022-01-15 DIAGNOSIS — M25351 Other instability, right hip: Secondary | ICD-10-CM | POA: Diagnosis not present

## 2022-01-15 DIAGNOSIS — M2569 Stiffness of other specified joint, not elsewhere classified: Secondary | ICD-10-CM | POA: Diagnosis not present

## 2022-01-15 DIAGNOSIS — M25361 Other instability, right knee: Secondary | ICD-10-CM | POA: Diagnosis not present

## 2022-01-15 DIAGNOSIS — M25652 Stiffness of left hip, not elsewhere classified: Secondary | ICD-10-CM | POA: Diagnosis not present

## 2022-01-15 DIAGNOSIS — M25651 Stiffness of right hip, not elsewhere classified: Secondary | ICD-10-CM | POA: Diagnosis not present

## 2022-01-15 DIAGNOSIS — M5451 Vertebrogenic low back pain: Secondary | ICD-10-CM | POA: Diagnosis not present

## 2022-01-15 DIAGNOSIS — R531 Weakness: Secondary | ICD-10-CM | POA: Diagnosis not present

## 2022-01-16 DIAGNOSIS — M25651 Stiffness of right hip, not elsewhere classified: Secondary | ICD-10-CM | POA: Diagnosis not present

## 2022-01-16 DIAGNOSIS — M25351 Other instability, right hip: Secondary | ICD-10-CM | POA: Diagnosis not present

## 2022-01-16 DIAGNOSIS — M25361 Other instability, right knee: Secondary | ICD-10-CM | POA: Diagnosis not present

## 2022-01-16 DIAGNOSIS — R531 Weakness: Secondary | ICD-10-CM | POA: Diagnosis not present

## 2022-01-16 DIAGNOSIS — M25652 Stiffness of left hip, not elsewhere classified: Secondary | ICD-10-CM | POA: Diagnosis not present

## 2022-01-16 DIAGNOSIS — M2569 Stiffness of other specified joint, not elsewhere classified: Secondary | ICD-10-CM | POA: Diagnosis not present

## 2022-01-16 DIAGNOSIS — M5451 Vertebrogenic low back pain: Secondary | ICD-10-CM | POA: Diagnosis not present

## 2022-01-17 ENCOUNTER — Other Ambulatory Visit: Payer: Self-pay | Admitting: *Deleted

## 2022-01-17 DIAGNOSIS — I7143 Infrarenal abdominal aortic aneurysm, without rupture: Secondary | ICD-10-CM

## 2022-01-22 DIAGNOSIS — M47814 Spondylosis without myelopathy or radiculopathy, thoracic region: Secondary | ICD-10-CM | POA: Diagnosis not present

## 2022-01-22 DIAGNOSIS — M4804 Spinal stenosis, thoracic region: Secondary | ICD-10-CM | POA: Diagnosis not present

## 2022-01-22 DIAGNOSIS — M5124 Other intervertebral disc displacement, thoracic region: Secondary | ICD-10-CM | POA: Diagnosis not present

## 2022-01-22 DIAGNOSIS — N281 Cyst of kidney, acquired: Secondary | ICD-10-CM | POA: Diagnosis not present

## 2022-01-24 ENCOUNTER — Other Ambulatory Visit: Payer: Self-pay | Admitting: Cardiology

## 2022-01-25 DIAGNOSIS — M5451 Vertebrogenic low back pain: Secondary | ICD-10-CM | POA: Diagnosis not present

## 2022-01-25 DIAGNOSIS — R531 Weakness: Secondary | ICD-10-CM | POA: Diagnosis not present

## 2022-01-25 DIAGNOSIS — M25652 Stiffness of left hip, not elsewhere classified: Secondary | ICD-10-CM | POA: Diagnosis not present

## 2022-01-25 DIAGNOSIS — M25361 Other instability, right knee: Secondary | ICD-10-CM | POA: Diagnosis not present

## 2022-01-25 DIAGNOSIS — M25351 Other instability, right hip: Secondary | ICD-10-CM | POA: Diagnosis not present

## 2022-01-25 DIAGNOSIS — M2569 Stiffness of other specified joint, not elsewhere classified: Secondary | ICD-10-CM | POA: Diagnosis not present

## 2022-01-25 DIAGNOSIS — M25651 Stiffness of right hip, not elsewhere classified: Secondary | ICD-10-CM | POA: Diagnosis not present

## 2022-01-30 DIAGNOSIS — M25361 Other instability, right knee: Secondary | ICD-10-CM | POA: Diagnosis not present

## 2022-01-30 DIAGNOSIS — M25651 Stiffness of right hip, not elsewhere classified: Secondary | ICD-10-CM | POA: Diagnosis not present

## 2022-01-30 DIAGNOSIS — M25652 Stiffness of left hip, not elsewhere classified: Secondary | ICD-10-CM | POA: Diagnosis not present

## 2022-01-30 DIAGNOSIS — M5451 Vertebrogenic low back pain: Secondary | ICD-10-CM | POA: Diagnosis not present

## 2022-01-30 DIAGNOSIS — R531 Weakness: Secondary | ICD-10-CM | POA: Diagnosis not present

## 2022-01-30 DIAGNOSIS — M2569 Stiffness of other specified joint, not elsewhere classified: Secondary | ICD-10-CM | POA: Diagnosis not present

## 2022-01-30 DIAGNOSIS — M25351 Other instability, right hip: Secondary | ICD-10-CM | POA: Diagnosis not present

## 2022-02-01 DIAGNOSIS — M5451 Vertebrogenic low back pain: Secondary | ICD-10-CM | POA: Diagnosis not present

## 2022-02-01 DIAGNOSIS — M25361 Other instability, right knee: Secondary | ICD-10-CM | POA: Diagnosis not present

## 2022-02-01 DIAGNOSIS — M25651 Stiffness of right hip, not elsewhere classified: Secondary | ICD-10-CM | POA: Diagnosis not present

## 2022-02-01 DIAGNOSIS — M2569 Stiffness of other specified joint, not elsewhere classified: Secondary | ICD-10-CM | POA: Diagnosis not present

## 2022-02-01 DIAGNOSIS — R531 Weakness: Secondary | ICD-10-CM | POA: Diagnosis not present

## 2022-02-01 DIAGNOSIS — M25351 Other instability, right hip: Secondary | ICD-10-CM | POA: Diagnosis not present

## 2022-02-01 DIAGNOSIS — M25652 Stiffness of left hip, not elsewhere classified: Secondary | ICD-10-CM | POA: Diagnosis not present

## 2022-02-06 DIAGNOSIS — M5451 Vertebrogenic low back pain: Secondary | ICD-10-CM | POA: Diagnosis not present

## 2022-02-06 DIAGNOSIS — M25361 Other instability, right knee: Secondary | ICD-10-CM | POA: Diagnosis not present

## 2022-02-06 DIAGNOSIS — M25351 Other instability, right hip: Secondary | ICD-10-CM | POA: Diagnosis not present

## 2022-02-06 DIAGNOSIS — R531 Weakness: Secondary | ICD-10-CM | POA: Diagnosis not present

## 2022-02-06 DIAGNOSIS — M2569 Stiffness of other specified joint, not elsewhere classified: Secondary | ICD-10-CM | POA: Diagnosis not present

## 2022-02-06 DIAGNOSIS — M25652 Stiffness of left hip, not elsewhere classified: Secondary | ICD-10-CM | POA: Diagnosis not present

## 2022-02-06 DIAGNOSIS — M25651 Stiffness of right hip, not elsewhere classified: Secondary | ICD-10-CM | POA: Diagnosis not present

## 2022-02-08 DIAGNOSIS — M25651 Stiffness of right hip, not elsewhere classified: Secondary | ICD-10-CM | POA: Diagnosis not present

## 2022-02-08 DIAGNOSIS — M25351 Other instability, right hip: Secondary | ICD-10-CM | POA: Diagnosis not present

## 2022-02-08 DIAGNOSIS — M5451 Vertebrogenic low back pain: Secondary | ICD-10-CM | POA: Diagnosis not present

## 2022-02-08 DIAGNOSIS — M2569 Stiffness of other specified joint, not elsewhere classified: Secondary | ICD-10-CM | POA: Diagnosis not present

## 2022-02-08 DIAGNOSIS — M25652 Stiffness of left hip, not elsewhere classified: Secondary | ICD-10-CM | POA: Diagnosis not present

## 2022-02-08 DIAGNOSIS — M25361 Other instability, right knee: Secondary | ICD-10-CM | POA: Diagnosis not present

## 2022-02-08 DIAGNOSIS — R531 Weakness: Secondary | ICD-10-CM | POA: Diagnosis not present

## 2022-02-09 NOTE — Progress Notes (Signed)
HPI: FU CAD s/p DES to LAD and BMS to Dx in 2003, PTCA LAD 9/18, HL, HTN, AFlutter (transient on treadmill - declined anticoagulation). Since last seen, patient denies dyspnea, chest pain, palpitations or syncope.  He is having difficulties with bilateral lower extremity weakness due to back issues.    Studies:  - LHC (10/2001):  Mid LAD 70%, Ostial D1 80%, CFX and RCA normal, EF 65%.  PCI:  Cypher DES to LAD and Express BMS to Dx.   -   LHC September 2018 showed anterior infarct secondary to partially occlusive thrombus in prior LAD stent successfully treated with heparin, Aggrastat and balloon angioplasty. No other obstructive disease noted. Dual antiplatelet therapy recommended long-term.  - Echocardiogram November 2022 showed normal LV function, moderate left ventricular hypertrophy, grade 1 diastolic dysfunction, mild aortic insufficiency..  - Nuclear (11/19):  No ischemia, EF 69%,   - Abdominal ultrasound (8/21): Abdominal aortic ectasia measuring 2.7 cm.   Follow-up recommended 24 months.  - Lower extremity venous Dopplers September 2020 negative.  - Carotid Dopplers November 2022 showed no significant stenosis.  Current Outpatient Medications  Medication Sig Dispense Refill   amLODipine (NORVASC) 10 MG tablet Take 1 tablet (10 mg total) by mouth daily. 90 tablet 2   atorvastatin (LIPITOR) 80 MG tablet TAKE 1 TABLET EVERY DAY  AT  6PM (KEEP MD APPOINTMENT) 90 tablet 3   buprenorphine (BUTRANS) 7.5 MCG/HR Place onto the skin once a week.     esomeprazole (NEXIUM) 40 MG capsule Take 1 capsule (40 mg total) by mouth daily before breakfast. 30 capsule 3   fluticasone (FLONASE) 50 MCG/ACT nasal spray Place 1 spray into both nostrils daily as needed for allergies.      furosemide (LASIX) 20 MG tablet Take 20 mg by mouth as needed.     irbesartan (AVAPRO) 300 MG tablet Take 1 tablet (300 mg total) by mouth daily. 90 tablet 3   LINZESS 145 MCG CAPS capsule Take 145 mcg by mouth daily as  needed (constipation).      metoprolol succinate (TOPROL-XL) 100 MG 24 hr tablet Take 1 tablet (100 mg total) by mouth daily. 90 tablet 2   No current facility-administered medications for this visit.     Past Medical History:  Diagnosis Date   Acute ST elevation myocardial infarction (STEMI) of anterior wall ([place]) 03/07/2017   Arthritis    BPH (benign prostatic hypertrophy)    CAD (coronary artery disease)    a. Lexiscan Myoview (11/2013):  Normal stress nuclear study.  LV Ejection Fraction: 98%   Complication of anesthesia    hx PONV   Degenerative joint disease    GERD (gastroesophageal reflux disease)    H/O hiatal hernia    History of atrial flutter    REMOTE HX ON TREADMILL   History of colon polyps    Hyperlipidemia    Hypertension    IBS (irritable bowel syndrome)    Lower urinary tract symptoms (LUTS)    OSA (obstructive sleep apnea) 03/26/2016   RBBB (right bundle branch block)    S/P drug eluting coronary stent placement    11-26-2001   X2 DES  TO LAD &  BMS   Schatzki's ring    Status post dilation of esophageal narrowing     Past Surgical History:  Procedure Laterality Date   APPENDECTOMY     CARDIOVASCULAR STRESS TEST  12/09/2013   NUCLEAR LEXISCAN STUDY--  RESULTS PENDING   CARDIOVASCULAR STRESS TEST  04/2011  DR Stanford Breed   NORMAL /  NO ISCHEMIA/  EF 67%   CHOLECYSTECTOMY     COLONOSCOPY  05/29/2011   RMR: tubular adenoma   COLONOSCOPY WITH PROPOFOL N/A 05/23/2020   ;  Surgeon: Daneil Dolin, MD;  Nonbleeding internal hemorrhoids, diverticulosis in sigmoid and descending colon, 10 polyps removed.Pathology revealed tubular adenomas.  Recommended repeat colonoscopy in 3 years if health permits.   CORONARY ANGIOPLASTY WITH STENT PLACEMENT  11-26-2001  DR Eustace Quail   DES (Cypher) to LAD 70%/  BMS (Express) to OSTIAL D1 80%/   CFX  &  RCA normal/  EF 65%   CORONARY/GRAFT ACUTE MI REVASCULARIZATION N/A 03/07/2017   Procedure: Coronary/Graft Acute MI  Revascularization;  Surgeon: Sherren Mocha, MD;  Location: [place] [facility];  Service: Cardiovascular;  Laterality: N/A;   ESOPHAGOGASTRODUODENOSCOPY  04/05/2004   RMR: Small hiatal hernia/Questionable subtle Schatzki's ring, status post dilation as described   ESOPHAGOGASTRODUODENOSCOPY  05/2011   RMR: small hiatal hernia, empiric dilation with 37F and 73F Maloney   ESOPHAGOGASTRODUODENOSCOPY (EGD) WITH ESOPHAGEAL DILATION N/A 01/16/2013   Dr. Rourk:baggy somewhat atonic esophagus-query underlying esophageal motility disorder. Status post passage of a Maloney dilator (large bore) empirically. Status post esophageal biopsy/Reflux symptoms are well controlled on Nexium.benign path   ESOPHAGOGASTRODUODENOSCOPY (EGD) WITH PROPOFOL N/A 05/23/2020   Surgeon: Daneil Dolin, MD; Somewhat baggy esophageal body, otherwise normal esophagus s/p empiric dilation, small hiatal hernia, otherwise normal exam.   LEFT HEART CATH AND CORONARY ANGIOGRAPHY N/A 03/07/2017   Procedure: LEFT HEART CATH AND CORONARY ANGIOGRAPHY;  Surgeon: Sherren Mocha, MD;  Location: [place] [facility];  Service: Cardiovascular;  Laterality: N/A;   MALONEY DILATION N/A 05/23/2020   Procedure: Venia Minks DILATION;  Surgeon: Daneil Dolin, MD;  [facility];  Service: Endoscopy;  Laterality: N/A;   POLYPECTOMY  05/23/2020   Procedure: POLYPECTOMY;  Surgeon: Daneil Dolin, MD;  [facility];  Service: Endoscopy;;   TOTAL HIP ARTHROPLASTY Left 09/07/2003   TOTAL KNEE ARTHROPLASTY Left 07/27/2002   TRANSURETHRAL RESECTION OF PROSTATE N/A 12/21/2013   Procedure: TRANSURETHRAL RESECTION OF THE PROSTATE WITH GYRUS INSTRUMENTS;  Surgeon: Jorja Loa, MD;  [facility];  Service: Urology;  Laterality: N/A;    Social History   Socioeconomic History   Marital status: Married    Spouse name: Not on file   Number of children: 2   Years of education: Not on file   Highest  education level: Not on file  Occupational History   Occupation: retired  Tobacco Use   Smoking status: Never   Smokeless tobacco: Never  Vaping Use   Vaping Use: Never used  Substance and Sexual Activity   Alcohol use: Not Currently    Comment: on holidays   Drug use: No   Sexual activity: Never  Other Topics Concern   Not on file  Social History Narrative   Retired from [employer] at home with wife   Right handed   Caffeine: none   Social Determinants of Health   Financial Resource Strain: Not on file  Food Insecurity: Not on file  Transportation Needs: Not on file  Physical Activity: Not on file  Stress: Not on file  Social Connections: Not on file  Intimate Partner Violence: Not on file    Family History  Problem Relation Age of Onset   Cancer Father        Lung  Hypertension Mother    Coronary artery disease Mother    Kidney failure Mother    Cancer Sister        breast carcinoma   Hypertension Sister    Colon cancer Neg Hx     ROS: Low back pain and bilateral lower extremity weakness due to disc problem but no fevers or chills, productive cough, hemoptysis, dysphasia, odynophagia, melena, hematochezia, dysuria, hematuria, rash, seizure activity, orthopnea, PND, claudication. Remaining systems are negative.  Physical Exam: Well-developed well-nourished in no acute distress.  Skin is warm and dry.  HEENT is normal.  Neck is supple.  Chest is clear to auscultation with normal expansion.  Cardiovascular exam is regular rate and rhythm.  Abdominal exam nontender or distended. No masses palpated. Extremities show 1+ edema. neuro grossly intact  ECG-normal sinus rhythm at a rate of 68, right bundle branch block.  Personally reviewed  A/P  1 coronary artery disease-patient doing well with no chest pain.  Continue statin.  Resume aspirin following back surgery.  2 abdominal aortic ectasia-we will arrange follow-up abdominal  ultrasound.  3 hyperlipidemia-continue statin.  4 hypertension-patient's blood pressure is controlled.  Continue present medications.  5 history of transient atrial flutter during prior treadmill.  Continue beta-blocker.  Previously declined anticoagulation.  6 history of lower extremity edema-reasonly well controlled.  Continue diuretics at present dose.  7 preoperative evaluation prior to back surgery-patient is not having chest pain or dyspnea.  His surgery is urgent as he is having weakness in his lower extremities bilaterally.  He may proceed without further cardiac evaluation.  He is off aspirin for his surgery but will resume afterwards.  Kirk Ruths, MD

## 2022-02-12 DIAGNOSIS — N281 Cyst of kidney, acquired: Secondary | ICD-10-CM | POA: Diagnosis not present

## 2022-02-13 DIAGNOSIS — M2569 Stiffness of other specified joint, not elsewhere classified: Secondary | ICD-10-CM | POA: Diagnosis not present

## 2022-02-13 DIAGNOSIS — M25351 Other instability, right hip: Secondary | ICD-10-CM | POA: Diagnosis not present

## 2022-02-13 DIAGNOSIS — M25651 Stiffness of right hip, not elsewhere classified: Secondary | ICD-10-CM | POA: Diagnosis not present

## 2022-02-13 DIAGNOSIS — R531 Weakness: Secondary | ICD-10-CM | POA: Diagnosis not present

## 2022-02-13 DIAGNOSIS — M25652 Stiffness of left hip, not elsewhere classified: Secondary | ICD-10-CM | POA: Diagnosis not present

## 2022-02-13 DIAGNOSIS — M25361 Other instability, right knee: Secondary | ICD-10-CM | POA: Diagnosis not present

## 2022-02-13 DIAGNOSIS — M5451 Vertebrogenic low back pain: Secondary | ICD-10-CM | POA: Diagnosis not present

## 2022-02-14 DIAGNOSIS — M5136 Other intervertebral disc degeneration, lumbar region: Secondary | ICD-10-CM | POA: Diagnosis not present

## 2022-02-14 DIAGNOSIS — M4726 Other spondylosis with radiculopathy, lumbar region: Secondary | ICD-10-CM | POA: Diagnosis not present

## 2022-02-15 DIAGNOSIS — M25651 Stiffness of right hip, not elsewhere classified: Secondary | ICD-10-CM | POA: Diagnosis not present

## 2022-02-15 DIAGNOSIS — M2569 Stiffness of other specified joint, not elsewhere classified: Secondary | ICD-10-CM | POA: Diagnosis not present

## 2022-02-15 DIAGNOSIS — M5451 Vertebrogenic low back pain: Secondary | ICD-10-CM | POA: Diagnosis not present

## 2022-02-15 DIAGNOSIS — M25652 Stiffness of left hip, not elsewhere classified: Secondary | ICD-10-CM | POA: Diagnosis not present

## 2022-02-15 DIAGNOSIS — M25351 Other instability, right hip: Secondary | ICD-10-CM | POA: Diagnosis not present

## 2022-02-15 DIAGNOSIS — R531 Weakness: Secondary | ICD-10-CM | POA: Diagnosis not present

## 2022-02-15 DIAGNOSIS — M25361 Other instability, right knee: Secondary | ICD-10-CM | POA: Diagnosis not present

## 2022-02-20 DIAGNOSIS — M25351 Other instability, right hip: Secondary | ICD-10-CM | POA: Diagnosis not present

## 2022-02-20 DIAGNOSIS — M25361 Other instability, right knee: Secondary | ICD-10-CM | POA: Diagnosis not present

## 2022-02-20 DIAGNOSIS — M25651 Stiffness of right hip, not elsewhere classified: Secondary | ICD-10-CM | POA: Diagnosis not present

## 2022-02-20 DIAGNOSIS — M25652 Stiffness of left hip, not elsewhere classified: Secondary | ICD-10-CM | POA: Diagnosis not present

## 2022-02-20 DIAGNOSIS — R531 Weakness: Secondary | ICD-10-CM | POA: Diagnosis not present

## 2022-02-20 DIAGNOSIS — M2569 Stiffness of other specified joint, not elsewhere classified: Secondary | ICD-10-CM | POA: Diagnosis not present

## 2022-02-20 DIAGNOSIS — M5451 Vertebrogenic low back pain: Secondary | ICD-10-CM | POA: Diagnosis not present

## 2022-02-22 ENCOUNTER — Encounter: Payer: Self-pay | Admitting: Cardiology

## 2022-02-22 ENCOUNTER — Ambulatory Visit (INDEPENDENT_AMBULATORY_CARE_PROVIDER_SITE_OTHER): Payer: Medicare PPO | Admitting: Cardiology

## 2022-02-22 VITALS — BP 122/84 | HR 68 | Ht 73.0 in | Wt 319.0 lb

## 2022-02-22 DIAGNOSIS — I714 Abdominal aortic aneurysm, without rupture, unspecified: Secondary | ICD-10-CM | POA: Diagnosis not present

## 2022-02-22 DIAGNOSIS — E785 Hyperlipidemia, unspecified: Secondary | ICD-10-CM

## 2022-02-22 DIAGNOSIS — I10 Essential (primary) hypertension: Secondary | ICD-10-CM | POA: Diagnosis not present

## 2022-02-22 DIAGNOSIS — I2510 Atherosclerotic heart disease of native coronary artery without angina pectoris: Secondary | ICD-10-CM

## 2022-02-22 DIAGNOSIS — I7143 Infrarenal abdominal aortic aneurysm, without rupture: Secondary | ICD-10-CM

## 2022-02-22 NOTE — Patient Instructions (Addendum)
Medication Instructions:  No changes *If you need a refill on your cardiac medications before your next appointment, please call your pharmacy*   Lab Work: None ordered If you have labs (blood work) drawn today and your tests are completely normal, you will receive your results only by: [place] (if you have MyChart) OR A paper copy in the mail If you have any lab test that is abnormal or we need to change your treatment, we will call you to review the results.   Testing/Procedures: Your physician has requested that you have an abdominal aorta duplex. During this test, an ultrasound is used to evaluate the aorta. Allow 30 minutes for this exam. Do not eat after midnight the day before and avoid carbonated beverages. This will take place at [place], [address].    Follow-Up: At [facility], you and your health needs are our priority.  As part of our continuing mission to provide you with exceptional heart care, we have created designated Provider Care Teams.  These Care Teams include your primary Cardiologist (physician) and Advanced Practice Providers (APPs -  Physician Assistants and Nurse Practitioners) who all work together to provide you with the care you need, when you need it.  We recommend signing up for the patient portal called "MyChart".  Sign up information is provided on this After Visit Summary.  MyChart is used to connect with patients for Virtual Visits (Telemedicine).  Patients are able to view lab/test results, encounter notes, upcoming appointments, etc.  Non-urgent messages can be sent to your provider as well.   To learn more about what you can do with MyChart, go to [email or web address].    Your next appointment:   6 month(s)  The format for your next appointment:   In Person  Provider:   Kirk Ruths, MD {   Important Information About [place]

## 2022-02-27 DIAGNOSIS — M2569 Stiffness of other specified joint, not elsewhere classified: Secondary | ICD-10-CM | POA: Diagnosis not present

## 2022-02-27 DIAGNOSIS — M25651 Stiffness of right hip, not elsewhere classified: Secondary | ICD-10-CM | POA: Diagnosis not present

## 2022-02-27 DIAGNOSIS — R531 Weakness: Secondary | ICD-10-CM | POA: Diagnosis not present

## 2022-02-27 DIAGNOSIS — M25351 Other instability, right hip: Secondary | ICD-10-CM | POA: Diagnosis not present

## 2022-02-27 DIAGNOSIS — M25361 Other instability, right knee: Secondary | ICD-10-CM | POA: Diagnosis not present

## 2022-02-27 DIAGNOSIS — M5451 Vertebrogenic low back pain: Secondary | ICD-10-CM | POA: Diagnosis not present

## 2022-02-27 DIAGNOSIS — M25652 Stiffness of left hip, not elsewhere classified: Secondary | ICD-10-CM | POA: Diagnosis not present

## 2022-03-01 DIAGNOSIS — M5451 Vertebrogenic low back pain: Secondary | ICD-10-CM | POA: Diagnosis not present

## 2022-03-01 DIAGNOSIS — M25652 Stiffness of left hip, not elsewhere classified: Secondary | ICD-10-CM | POA: Diagnosis not present

## 2022-03-01 DIAGNOSIS — M2569 Stiffness of other specified joint, not elsewhere classified: Secondary | ICD-10-CM | POA: Diagnosis not present

## 2022-03-01 DIAGNOSIS — R531 Weakness: Secondary | ICD-10-CM | POA: Diagnosis not present

## 2022-03-01 DIAGNOSIS — M25351 Other instability, right hip: Secondary | ICD-10-CM | POA: Diagnosis not present

## 2022-03-01 DIAGNOSIS — M25651 Stiffness of right hip, not elsewhere classified: Secondary | ICD-10-CM | POA: Diagnosis not present

## 2022-03-01 DIAGNOSIS — M25361 Other instability, right knee: Secondary | ICD-10-CM | POA: Diagnosis not present

## 2022-03-07 DIAGNOSIS — N400 Enlarged prostate without lower urinary tract symptoms: Secondary | ICD-10-CM | POA: Diagnosis not present

## 2022-03-07 DIAGNOSIS — N281 Cyst of kidney, acquired: Secondary | ICD-10-CM | POA: Diagnosis not present

## 2022-03-07 DIAGNOSIS — N19 Unspecified kidney failure: Secondary | ICD-10-CM | POA: Diagnosis not present

## 2022-03-08 ENCOUNTER — Ambulatory Visit (HOSPITAL_COMMUNITY)
Admission: RE | Admit: 2022-03-08 | Discharge: 2022-03-08 | Disposition: A | Discharge status: Home / Self Care | Payer: Medicare PPO | Source: Ambulatory Visit | Attending: Cardiology | Admitting: Cardiology

## 2022-03-08 DIAGNOSIS — I714 Abdominal aortic aneurysm, without rupture, unspecified: Secondary | ICD-10-CM | POA: Insufficient documentation

## 2022-03-08 DIAGNOSIS — M545 Low back pain, unspecified: Secondary | ICD-10-CM

## 2022-03-14 DIAGNOSIS — Z01810 Encounter for preprocedural cardiovascular examination: Secondary | ICD-10-CM | POA: Diagnosis not present

## 2022-03-14 DIAGNOSIS — M4804 Spinal stenosis, thoracic region: Secondary | ICD-10-CM | POA: Diagnosis not present

## 2022-03-14 DIAGNOSIS — I2510 Atherosclerotic heart disease of native coronary artery without angina pectoris: Secondary | ICD-10-CM | POA: Diagnosis not present

## 2022-03-14 DIAGNOSIS — Z01812 Encounter for preprocedural laboratory examination: Secondary | ICD-10-CM | POA: Diagnosis not present

## 2022-03-14 DIAGNOSIS — G4733 Obstructive sleep apnea (adult) (pediatric): Secondary | ICD-10-CM | POA: Diagnosis not present

## 2022-03-14 DIAGNOSIS — I10 Essential (primary) hypertension: Secondary | ICD-10-CM | POA: Diagnosis not present

## 2022-03-14 DIAGNOSIS — M549 Dorsalgia, unspecified: Secondary | ICD-10-CM | POA: Diagnosis not present

## 2022-03-14 DIAGNOSIS — R600 Localized edema: Secondary | ICD-10-CM | POA: Diagnosis not present

## 2022-03-14 DIAGNOSIS — I4892 Unspecified atrial flutter: Secondary | ICD-10-CM | POA: Diagnosis not present

## 2022-03-14 DIAGNOSIS — K219 Gastro-esophageal reflux disease without esophagitis: Secondary | ICD-10-CM | POA: Diagnosis not present

## 2022-03-14 DIAGNOSIS — Z01818 Encounter for other preprocedural examination: Secondary | ICD-10-CM | POA: Diagnosis not present

## 2022-03-15 ENCOUNTER — Encounter: Payer: Self-pay | Admitting: *Deleted

## 2022-03-21 ENCOUNTER — Encounter: Payer: Self-pay | Admitting: *Deleted

## 2022-03-27 DIAGNOSIS — M4804 Spinal stenosis, thoracic region: Secondary | ICD-10-CM | POA: Diagnosis not present

## 2022-03-28 DIAGNOSIS — K219 Gastro-esophageal reflux disease without esophagitis: Secondary | ICD-10-CM | POA: Diagnosis not present

## 2022-03-28 DIAGNOSIS — I119 Hypertensive heart disease without heart failure: Secondary | ICD-10-CM | POA: Diagnosis not present

## 2022-03-28 DIAGNOSIS — E7800 Pure hypercholesterolemia, unspecified: Secondary | ICD-10-CM | POA: Diagnosis not present

## 2022-03-28 DIAGNOSIS — G4733 Obstructive sleep apnea (adult) (pediatric): Secondary | ICD-10-CM | POA: Diagnosis not present

## 2022-03-28 DIAGNOSIS — I4510 Unspecified right bundle-branch block: Secondary | ICD-10-CM | POA: Diagnosis not present

## 2022-03-28 DIAGNOSIS — I10 Essential (primary) hypertension: Secondary | ICD-10-CM | POA: Diagnosis not present

## 2022-03-28 DIAGNOSIS — M4804 Spinal stenosis, thoracic region: Secondary | ICD-10-CM | POA: Diagnosis not present

## 2022-03-28 DIAGNOSIS — I2510 Atherosclerotic heart disease of native coronary artery without angina pectoris: Secondary | ICD-10-CM | POA: Diagnosis not present

## 2022-03-28 DIAGNOSIS — Z955 Presence of coronary angioplasty implant and graft: Secondary | ICD-10-CM | POA: Diagnosis not present

## 2022-03-28 DIAGNOSIS — G4730 Sleep apnea, unspecified: Secondary | ICD-10-CM | POA: Diagnosis not present

## 2022-03-28 DIAGNOSIS — I252 Old myocardial infarction: Secondary | ICD-10-CM | POA: Diagnosis not present

## 2022-04-16 DIAGNOSIS — M5136 Other intervertebral disc degeneration, lumbar region: Secondary | ICD-10-CM | POA: Diagnosis not present

## 2022-04-16 DIAGNOSIS — F1120 Opioid dependence, uncomplicated: Secondary | ICD-10-CM | POA: Diagnosis not present

## 2022-04-16 DIAGNOSIS — M48061 Spinal stenosis, lumbar region without neurogenic claudication: Secondary | ICD-10-CM | POA: Diagnosis not present

## 2022-04-16 DIAGNOSIS — M5416 Radiculopathy, lumbar region: Secondary | ICD-10-CM | POA: Diagnosis not present

## 2022-04-17 ENCOUNTER — Other Ambulatory Visit: Payer: Self-pay | Admitting: Gastroenterology

## 2022-04-17 DIAGNOSIS — K219 Gastro-esophageal reflux disease without esophagitis: Secondary | ICD-10-CM

## 2022-04-17 DIAGNOSIS — R110 Nausea: Secondary | ICD-10-CM

## 2022-05-02 DIAGNOSIS — E1122 Type 2 diabetes mellitus with diabetic chronic kidney disease: Secondary | ICD-10-CM | POA: Diagnosis not present

## 2022-05-02 DIAGNOSIS — E785 Hyperlipidemia, unspecified: Secondary | ICD-10-CM | POA: Diagnosis not present

## 2022-05-02 DIAGNOSIS — I10 Essential (primary) hypertension: Secondary | ICD-10-CM | POA: Diagnosis not present

## 2022-05-09 DIAGNOSIS — Z955 Presence of coronary angioplasty implant and graft: Secondary | ICD-10-CM | POA: Diagnosis not present

## 2022-05-09 DIAGNOSIS — E611 Iron deficiency: Secondary | ICD-10-CM | POA: Diagnosis not present

## 2022-05-09 DIAGNOSIS — G894 Chronic pain syndrome: Secondary | ICD-10-CM | POA: Diagnosis not present

## 2022-05-09 DIAGNOSIS — R972 Elevated prostate specific antigen [PSA]: Secondary | ICD-10-CM | POA: Diagnosis not present

## 2022-05-09 DIAGNOSIS — E1122 Type 2 diabetes mellitus with diabetic chronic kidney disease: Secondary | ICD-10-CM | POA: Diagnosis not present

## 2022-05-09 DIAGNOSIS — D6869 Other thrombophilia: Secondary | ICD-10-CM | POA: Diagnosis not present

## 2022-05-09 DIAGNOSIS — M545 Low back pain, unspecified: Secondary | ICD-10-CM | POA: Diagnosis not present

## 2022-05-09 DIAGNOSIS — E782 Mixed hyperlipidemia: Secondary | ICD-10-CM | POA: Diagnosis not present

## 2022-05-31 DIAGNOSIS — M25561 Pain in right knee: Secondary | ICD-10-CM | POA: Diagnosis not present

## 2022-05-31 DIAGNOSIS — M1711 Unilateral primary osteoarthritis, right knee: Secondary | ICD-10-CM | POA: Diagnosis not present

## 2022-06-19 DIAGNOSIS — M25512 Pain in left shoulder: Secondary | ICD-10-CM | POA: Diagnosis not present

## 2022-06-19 DIAGNOSIS — M542 Cervicalgia: Secondary | ICD-10-CM | POA: Diagnosis not present

## 2022-06-19 DIAGNOSIS — M25511 Pain in right shoulder: Secondary | ICD-10-CM | POA: Diagnosis not present

## 2022-06-20 ENCOUNTER — Other Ambulatory Visit: Payer: Self-pay | Admitting: Cardiology

## 2022-07-09 DIAGNOSIS — M47816 Spondylosis without myelopathy or radiculopathy, lumbar region: Secondary | ICD-10-CM | POA: Diagnosis not present

## 2022-07-09 DIAGNOSIS — G9520 Unspecified cord compression: Secondary | ICD-10-CM | POA: Diagnosis not present

## 2022-07-09 DIAGNOSIS — R29898 Other symptoms and signs involving the musculoskeletal system: Secondary | ICD-10-CM | POA: Diagnosis not present

## 2022-07-09 DIAGNOSIS — M5136 Other intervertebral disc degeneration, lumbar region: Secondary | ICD-10-CM | POA: Diagnosis not present

## 2022-07-16 DIAGNOSIS — M1711 Unilateral primary osteoarthritis, right knee: Secondary | ICD-10-CM | POA: Diagnosis not present

## 2022-07-17 DIAGNOSIS — F1120 Opioid dependence, uncomplicated: Secondary | ICD-10-CM | POA: Diagnosis not present

## 2022-07-17 DIAGNOSIS — M48061 Spinal stenosis, lumbar region without neurogenic claudication: Secondary | ICD-10-CM | POA: Diagnosis not present

## 2022-07-17 DIAGNOSIS — M5136 Other intervertebral disc degeneration, lumbar region: Secondary | ICD-10-CM | POA: Diagnosis not present

## 2022-07-17 DIAGNOSIS — M5416 Radiculopathy, lumbar region: Secondary | ICD-10-CM | POA: Diagnosis not present

## 2022-07-17 DIAGNOSIS — M47816 Spondylosis without myelopathy or radiculopathy, lumbar region: Secondary | ICD-10-CM | POA: Diagnosis not present

## 2022-07-24 ENCOUNTER — Ambulatory Visit: Payer: Medicare PPO | Admitting: Urology

## 2022-07-25 DIAGNOSIS — M1711 Unilateral primary osteoarthritis, right knee: Secondary | ICD-10-CM | POA: Diagnosis not present

## 2022-07-28 ENCOUNTER — Encounter: Payer: Self-pay | Admitting: Cardiology

## 2022-07-30 DIAGNOSIS — M47816 Spondylosis without myelopathy or radiculopathy, lumbar region: Secondary | ICD-10-CM | POA: Diagnosis not present

## 2022-07-30 DIAGNOSIS — F1120 Opioid dependence, uncomplicated: Secondary | ICD-10-CM | POA: Diagnosis not present

## 2022-08-01 DIAGNOSIS — M1711 Unilateral primary osteoarthritis, right knee: Secondary | ICD-10-CM | POA: Diagnosis not present

## 2022-08-02 ENCOUNTER — Ambulatory Visit: Payer: Medicare PPO | Admitting: Nurse Practitioner

## 2022-08-06 DIAGNOSIS — G894 Chronic pain syndrome: Secondary | ICD-10-CM | POA: Diagnosis not present

## 2022-08-17 ENCOUNTER — Other Ambulatory Visit: Payer: Self-pay | Admitting: Cardiology

## 2022-08-21 ENCOUNTER — Ambulatory Visit: Payer: Medicare PPO | Admitting: Urology

## 2022-08-22 DIAGNOSIS — R29898 Other symptoms and signs involving the musculoskeletal system: Secondary | ICD-10-CM | POA: Diagnosis not present

## 2022-09-05 DIAGNOSIS — I10 Essential (primary) hypertension: Secondary | ICD-10-CM | POA: Diagnosis not present

## 2022-09-05 DIAGNOSIS — E785 Hyperlipidemia, unspecified: Secondary | ICD-10-CM | POA: Diagnosis not present

## 2022-09-05 DIAGNOSIS — E1122 Type 2 diabetes mellitus with diabetic chronic kidney disease: Secondary | ICD-10-CM | POA: Diagnosis not present

## 2022-09-10 DIAGNOSIS — M5011 Cervical disc disorder with radiculopathy,  high cervical region: Secondary | ICD-10-CM | POA: Diagnosis not present

## 2022-09-10 DIAGNOSIS — M2428 Disorder of ligament, vertebrae: Secondary | ICD-10-CM | POA: Diagnosis not present

## 2022-09-10 DIAGNOSIS — M50121 Cervical disc disorder at C4-C5 level with radiculopathy: Secondary | ICD-10-CM | POA: Diagnosis not present

## 2022-09-10 DIAGNOSIS — M4802 Spinal stenosis, cervical region: Secondary | ICD-10-CM | POA: Diagnosis not present

## 2022-09-10 DIAGNOSIS — M4322 Fusion of spine, cervical region: Secondary | ICD-10-CM | POA: Diagnosis not present

## 2022-09-10 DIAGNOSIS — M50023 Cervical disc disorder at C6-C7 level with myelopathy: Secondary | ICD-10-CM | POA: Diagnosis not present

## 2022-09-10 DIAGNOSIS — M50123 Cervical disc disorder at C6-C7 level with radiculopathy: Secondary | ICD-10-CM | POA: Diagnosis not present

## 2022-09-10 DIAGNOSIS — M5001 Cervical disc disorder with myelopathy,  high cervical region: Secondary | ICD-10-CM | POA: Diagnosis not present

## 2022-09-10 DIAGNOSIS — M50022 Cervical disc disorder at C5-C6 level with myelopathy: Secondary | ICD-10-CM | POA: Diagnosis not present

## 2022-09-11 DIAGNOSIS — G8929 Other chronic pain: Secondary | ICD-10-CM | POA: Diagnosis not present

## 2022-09-11 DIAGNOSIS — Z955 Presence of coronary angioplasty implant and graft: Secondary | ICD-10-CM | POA: Diagnosis not present

## 2022-09-11 DIAGNOSIS — M545 Low back pain, unspecified: Secondary | ICD-10-CM | POA: Diagnosis not present

## 2022-09-11 DIAGNOSIS — E1122 Type 2 diabetes mellitus with diabetic chronic kidney disease: Secondary | ICD-10-CM | POA: Diagnosis not present

## 2022-09-11 DIAGNOSIS — E119 Type 2 diabetes mellitus without complications: Secondary | ICD-10-CM | POA: Diagnosis not present

## 2022-09-11 DIAGNOSIS — E782 Mixed hyperlipidemia: Secondary | ICD-10-CM | POA: Diagnosis not present

## 2022-09-11 DIAGNOSIS — G894 Chronic pain syndrome: Secondary | ICD-10-CM | POA: Diagnosis not present

## 2022-09-11 DIAGNOSIS — R972 Elevated prostate specific antigen [PSA]: Secondary | ICD-10-CM | POA: Diagnosis not present

## 2022-09-13 DIAGNOSIS — M47816 Spondylosis without myelopathy or radiculopathy, lumbar region: Secondary | ICD-10-CM | POA: Diagnosis not present

## 2022-09-13 DIAGNOSIS — Z79891 Long term (current) use of opiate analgesic: Secondary | ICD-10-CM | POA: Diagnosis not present

## 2022-09-13 DIAGNOSIS — M79631 Pain in right forearm: Secondary | ICD-10-CM | POA: Diagnosis not present

## 2022-09-13 DIAGNOSIS — G8929 Other chronic pain: Secondary | ICD-10-CM | POA: Diagnosis not present

## 2022-09-13 DIAGNOSIS — M25561 Pain in right knee: Secondary | ICD-10-CM | POA: Diagnosis not present

## 2022-09-13 DIAGNOSIS — M48061 Spinal stenosis, lumbar region without neurogenic claudication: Secondary | ICD-10-CM | POA: Diagnosis not present

## 2022-09-13 DIAGNOSIS — M545 Low back pain, unspecified: Secondary | ICD-10-CM | POA: Diagnosis not present

## 2022-09-13 DIAGNOSIS — G894 Chronic pain syndrome: Secondary | ICD-10-CM | POA: Diagnosis not present

## 2022-09-13 DIAGNOSIS — F1120 Opioid dependence, uncomplicated: Secondary | ICD-10-CM | POA: Diagnosis not present

## 2022-09-19 DIAGNOSIS — M1711 Unilateral primary osteoarthritis, right knee: Secondary | ICD-10-CM | POA: Diagnosis not present

## 2022-09-21 ENCOUNTER — Encounter: Payer: Self-pay | Admitting: Cardiology

## 2022-09-21 ENCOUNTER — Telehealth: Payer: Self-pay | Admitting: *Deleted

## 2022-09-21 NOTE — Telephone Encounter (Signed)
   Pre-operative Risk Assessment    Patient Name: Gregory Wilson  DOB: 01-02-1941 MRN: [id number]      Request for Surgical Clearance    Procedure:   RIGHT TOTAL KNEE ARTHROPLASTY  Date of Surgery:  Clearance TBD                                 Surgeon:  DR. Hart Wilson Surgeon's Group or Practice Name:  Gregory Wilson Phone number:  [phone] ATTN: Gregory Wilson Fax number:  [phone]   Type of Clearance Requested:   - Medical ; NO MEDICATIONS LISTED AS NEEDING TO BE HELD   Type of Anesthesia:  Spinal   Additional requests/questions:    Gregory Wilson   09/21/2022, 12:12 PM

## 2022-09-21 NOTE — Telephone Encounter (Signed)
Pt has been scheduled for tele pre op appt 10/01/22 @ 2 pm. Med rec and consent are done.

## 2022-09-21 NOTE — Telephone Encounter (Signed)
Pt has been scheduled for tele pre op appt 10/01/22 @ 2 pm. Med rec and consent are done.     Patient Consent for Virtual Visit        Gregory Wilson has provided verbal consent on 09/21/2022 for a virtual visit (video or telephone).   CONSENT FOR VIRTUAL VISIT FOR:  Gregory Wilson  By participating in this virtual visit I agree to the following:  I hereby voluntarily request, consent and authorize [facility] and its employed or contracted physicians, physician assistants, nurse practitioners or other licensed health care professionals (the Practitioner), to provide me with telemedicine health care services (the "Services") as deemed necessary by the treating Practitioner. I acknowledge and consent to receive the Services by the Practitioner via telemedicine. I understand that the telemedicine visit will involve communicating with the Practitioner through live audiovisual communication technology and the disclosure of certain medical information by electronic transmission. I acknowledge that I have been given the opportunity to request an in-person assessment or other available alternative prior to the telemedicine visit and am voluntarily participating in the telemedicine visit.  I understand that I have the right to withhold or withdraw my consent to the use of telemedicine in the course of my care at any time, without affecting my right to future care or treatment, and that the Practitioner or I may terminate the telemedicine visit at any time. I understand that I have the right to inspect all information obtained and/or recorded in the course of the telemedicine visit and may receive copies of available information for a reasonable fee.  I understand that some of the potential risks of receiving the Services via telemedicine include:  Delay or interruption in medical evaluation due to technological equipment failure or disruption; Information transmitted may not be sufficient (e.g.  poor resolution of images) to allow for appropriate medical decision making by the Practitioner; and/or  In rare instances, security protocols could fail, causing a breach of personal health information.  Furthermore, I acknowledge that it is my responsibility to provide information about my medical history, conditions and care that is complete and accurate to the best of my ability. I acknowledge that Practitioner's advice, recommendations, and/or decision may be based on factors not within their control, such as incomplete or inaccurate data provided by me or distortions of diagnostic images or specimens that may result from electronic transmissions. I understand that the practice of medicine is not an exact science and that Practitioner makes no warranties or guarantees regarding treatment outcomes. I acknowledge that a copy of this consent can be made available to me via my patient portal ([facility]), or I can request a printed copy by calling the office of [facility].    I understand that my insurance will be billed for this visit.   I have read or had this consent read to me. I understand the contents of this consent, which adequately explains the benefits and risks of the Services being provided via telemedicine.  I have been provided ample opportunity to ask questions regarding this consent and the Services and have had my questions answered to my satisfaction. I give my informed consent for the services to be provided through the use of telemedicine in my medical care

## 2022-09-21 NOTE — Telephone Encounter (Signed)
   Name: Gregory Wilson  DOB: [birth year]  MRN: [id number]  Primary Cardiologist: Kirk Ruths, MD  Chart reviewed as part of pre-operative protocol coverage. Because of Gregory Wilson past medical history and time since last visit, he will require a follow-up telephone visit in order to better assess preoperative cardiovascular risk.  Pre-op covering staff: - Please schedule appointment and call patient to inform them. If patient already had an upcoming appointment within acceptable timeframe, please add "pre-op clearance" to the appointment notes so provider is aware. - Please contact requesting surgeon's office via preferred method (i.e, phone, fax) to inform them of need for appointment prior to surgery.  No medications indicated as needing held.   Elgie Collard, PA-C  09/21/2022, 12:23 PM

## 2022-10-01 ENCOUNTER — Ambulatory Visit: Payer: Medicare PPO | Attending: Internal Medicine

## 2022-10-01 DIAGNOSIS — Z01810 Encounter for preprocedural cardiovascular examination: Secondary | ICD-10-CM | POA: Diagnosis not present

## 2022-10-01 NOTE — Progress Notes (Addendum)
Virtual Visit via Telephone Note   Because of MAGGIE KRIPPNER co-morbid illnesses, he is at least at moderate risk for complications without adequate follow up.  This format is felt to be most appropriate for this patient at this time.  The patient did not have access to video technology/had technical difficulties with video requiring transitioning to audio format only (telephone).  All issues noted in this document were discussed and addressed.  No physical exam could be performed with this format.  Please refer to the patient's chart for his consent to telehealth for [facility].  Evaluation Performed:  Preoperative cardiovascular risk assessment _____________   Date:  10/01/2022   Patient ID:  Gregory Wilson, DOB 1940-10-10, MRN [id number] Patient Location:  Home Provider location:   Office  Primary Care Provider:  Benita Stabile, MD Primary Cardiologist:  Olga Millers, MD  Chief Complaint / Patient Profile   82 y.o. y/o male with a h/o CAD s/p DES to LAD and BMS to Dx in 2003, PTCA LAD 9/18, HL, HTN, AFlutter (transient on treadmill  who is pending right total knee replacment and presents today for telephonic preoperative cardiovascular risk assessment.  History of Present Illness    Gregory Wilson is a 82 y.o. male who presents via audio/video conferencing for a telehealth visit today.  Pt was last seen in cardiology clinic on 02/02/2022 by Dr. [name].  At that time Gregory Wilson was doing well with no chest pain or cardiac complaints. The patient is now pending procedure as outlined above. Since his last visit, he reports that he has not experienced any chest pain or cardiac complaints since his previous visit.  He is mostly dealing with arthritic back pain and knee pain at that time.  He denies chest pain, shortness of breath, lower extremity edema, fatigue, palpitations, melena, hematuria, hemoptysis, diaphoresis, weakness, presyncope, syncope, orthopnea, and  PND.    Past Medical History    Past Medical History:  Diagnosis Date   Acute ST elevation myocardial infarction (STEMI) of anterior wall (HCC) 03/07/2017   Arthritis    BPH (benign prostatic hypertrophy)    CAD (coronary artery disease)    a. Lexiscan Myoview (11/2013):  Normal stress nuclear study.  LV Ejection Fraction: 65%   Complication of anesthesia    hx PONV   Degenerative joint disease    GERD (gastroesophageal reflux disease)    H/O hiatal hernia    History of atrial flutter    REMOTE HX ON TREADMILL   History of colon polyps    Hyperlipidemia    Hypertension    IBS (irritable bowel syndrome)    Lower urinary tract symptoms (LUTS)    OSA (obstructive sleep apnea) 03/26/2016   RBBB (right bundle branch block)    S/P drug eluting coronary stent placement    11-26-2001   X2 DES  TO LAD &  BMS   Schatzki's ring    Status post dilation of esophageal narrowing    Past Surgical History:  Procedure Laterality Date   APPENDECTOMY     CARDIOVASCULAR STRESS TEST  12/09/2013   NUCLEAR LEXISCAN STUDY--  RESULTS PENDING   CARDIOVASCULAR STRESS TEST  04/2011  DR CRENSHAW   NORMAL /  NO ISCHEMIA/  EF 67%   CHOLECYSTECTOMY     COLONOSCOPY  05/29/2011   RMR: tubular adenoma   COLONOSCOPY WITH PROPOFOL N/A 05/23/2020   ;  Surgeon: Corbin Ade, MD;  Nonbleeding internal hemorrhoids, diverticulosis in sigmoid  and descending colon, 10 polyps removed.Pathology revealed tubular adenomas.  Recommended repeat colonoscopy in 3 years if health permits.   CORONARY ANGIOPLASTY WITH STENT PLACEMENT  11-26-2001  DR Charlies Constable   DES (Cypher) to LAD 70%/  BMS (Express) to OSTIAL D1 80%/   CFX  &  RCA normal/  EF 65%   CORONARY/GRAFT ACUTE MI REVASCULARIZATION N/A 03/07/2017   Procedure: Coronary/Graft Acute MI Revascularization;  Surgeon: Tonny Bollman, MD;  Location: [facility] INVASIVE CV LAB;  Service: Cardiovascular;  Laterality: N/A;   ESOPHAGOGASTRODUODENOSCOPY  04/05/2004   RMR: Small  hiatal hernia/Questionable subtle Schatzki's ring, status post dilation as described   ESOPHAGOGASTRODUODENOSCOPY  05/2011   RMR: small hiatal hernia, empiric dilation with 59F and 33F Maloney   ESOPHAGOGASTRODUODENOSCOPY (EGD) WITH ESOPHAGEAL DILATION N/A 01/16/2013   Dr. Rourk:baggy somewhat atonic esophagus-query underlying esophageal motility disorder. Status post passage of a Maloney dilator (large bore) empirically. Status post esophageal biopsy/Reflux symptoms are well controlled on Nexium.benign path   ESOPHAGOGASTRODUODENOSCOPY (EGD) WITH PROPOFOL N/A 05/23/2020   Surgeon: Corbin Ade, MD; Somewhat baggy esophageal body, otherwise normal esophagus s/p empiric dilation, small hiatal hernia, otherwise normal exam.   LEFT HEART CATH AND CORONARY ANGIOGRAPHY N/A 03/07/2017   Procedure: LEFT HEART CATH AND CORONARY ANGIOGRAPHY;  Surgeon: Tonny Bollman, MD;  Location: [facility] INVASIVE CV LAB;  Service: Cardiovascular;  Laterality: N/A;   MALONEY DILATION N/A 05/23/2020   Procedure: Elease Hashimoto DILATION;  Surgeon: Corbin Ade, MD;  [facility];  Service: Endoscopy;  Laterality: N/A;   POLYPECTOMY  05/23/2020   Procedure: POLYPECTOMY;  Surgeon: Corbin Ade, MD;  Location: [facility];  Service: Endoscopy;;   TOTAL HIP ARTHROPLASTY Left 09/07/2003   TOTAL KNEE ARTHROPLASTY Left 07/27/2002   TRANSURETHRAL RESECTION OF PROSTATE N/A 12/21/2013   Procedure: TRANSURETHRAL RESECTION OF THE PROSTATE WITH GYRUS INSTRUMENTS;  Surgeon: Chelsea Aus, MD;  Location: [facility];  Service: Urology;  Laterality: N/A;    Allergies  Allergies  Allergen Reactions   Codeine Nausea And Vomiting    Home Medications    Prior to Admission medications   Medication Sig Start Date End Date Taking? Authorizing Provider  amLODipine (NORVASC) 10 MG tablet TAKE 1 TABLET EVERY MORNING 06/22/22   Lewayne Bunting, MD  atorvastatin (LIPITOR) 80 MG tablet TAKE 1 TABLET EVERY  DAY  AT  6PM (KEEP MD APPOINTMENT) 01/24/22   Lewayne Bunting, MD  buprenorphine (BUTRANS) 10 MCG/HR PTWK Place onto the skin once a week.    [provider]  esomeprazole (NEXIUM) 40 MG capsule TAKE 1 CAPSULE(40 MG) BY MOUTH DAILY BEFORE BREAKFAST 04/18/22   Mahon, Frederik Schmidt, NP  fluticasone (FLONASE) 50 MCG/ACT nasal spray Place 1 spray into both nostrils daily as needed for allergies.  06/13/19   [provider]  furosemide (LASIX) 20 MG tablet Take 20 mg by mouth as needed. 02/05/22   [provider]  irbesartan (AVAPRO) 300 MG tablet Take 1 tablet (300 mg total) by mouth daily. 08/31/21   Lewayne Bunting, MD  LINZESS 145 MCG CAPS capsule Take 145 mcg by mouth daily as needed (constipation).  06/15/19   [provider]  methocarbamol (ROBAXIN) 750 MG tablet Take 1 tablet by mouth every 8 (eight) hours.    [provider]  metoprolol succinate (TOPROL-XL) 100 MG 24 hr tablet TAKE 1 TABLET EVERY DAY 08/17/22   Lewayne Bunting, MD    Physical Exam    Vital Signs:  Gregory Wilson does not have vital signs available for review today.  Given telephonic nature of communication, physical exam is limited. AAOx3. NAD. Normal affect.  Speech and respirations are unlabored.  Accessory Clinical Findings    None  Assessment & Plan    1.  Preoperative Cardiovascular Risk Assessment:  Gregory Wilson perioperative risk of a major cardiac event is 6.6% according to the Revised Cardiac Risk Index (RCRI).  Therefore, he is at moderate risk for perioperative complications.   His functional capacity is fair at 4.3 METs according to the Duke Activity Status Index (DASI). Recommendations: According to ACC/AHA guidelines, no further cardiovascular testing needed.  The patient may proceed to surgery at acceptable risk.   Antiplatelet and/or Anticoagulation Recommendations: Regarding ASA therapy, it may be stopped 5-7 days prior to surgery with a plan to resume  it as soon as felt to be feasible from a surgical standpoint in the post-operative period.     The patient was advised that if he develops new symptoms prior to surgery to contact our office to arrange for a follow-up visit, and he verbalized understanding.  A copy of this note will be routed to requesting surgeon.  Time:   Today, I have spent 8 minutes with the patient with telehealth technology discussing medical history, symptoms, and management plan.     Napoleon Form, Leodis Rains, NP  10/01/2022, 8:06 AM

## 2022-10-08 DIAGNOSIS — M4802 Spinal stenosis, cervical region: Secondary | ICD-10-CM | POA: Diagnosis not present

## 2022-10-08 DIAGNOSIS — G992 Myelopathy in diseases classified elsewhere: Secondary | ICD-10-CM | POA: Diagnosis not present

## 2022-10-08 DIAGNOSIS — M47812 Spondylosis without myelopathy or radiculopathy, cervical region: Secondary | ICD-10-CM | POA: Diagnosis not present

## 2022-10-08 DIAGNOSIS — M5030 Other cervical disc degeneration, unspecified cervical region: Secondary | ICD-10-CM | POA: Diagnosis not present

## 2022-10-09 ENCOUNTER — Ambulatory Visit: Payer: Medicare PPO | Admitting: Urology

## 2022-10-11 DIAGNOSIS — Z79891 Long term (current) use of opiate analgesic: Secondary | ICD-10-CM | POA: Diagnosis not present

## 2022-10-11 DIAGNOSIS — M79631 Pain in right forearm: Secondary | ICD-10-CM | POA: Diagnosis not present

## 2022-10-11 DIAGNOSIS — G894 Chronic pain syndrome: Secondary | ICD-10-CM | POA: Diagnosis not present

## 2022-10-11 DIAGNOSIS — M47816 Spondylosis without myelopathy or radiculopathy, lumbar region: Secondary | ICD-10-CM | POA: Diagnosis not present

## 2022-10-11 DIAGNOSIS — M48061 Spinal stenosis, lumbar region without neurogenic claudication: Secondary | ICD-10-CM | POA: Diagnosis not present

## 2022-10-11 DIAGNOSIS — M25561 Pain in right knee: Secondary | ICD-10-CM | POA: Diagnosis not present

## 2022-10-11 DIAGNOSIS — G8929 Other chronic pain: Secondary | ICD-10-CM | POA: Diagnosis not present

## 2022-10-11 DIAGNOSIS — M545 Low back pain, unspecified: Secondary | ICD-10-CM | POA: Diagnosis not present

## 2022-10-11 DIAGNOSIS — F1120 Opioid dependence, uncomplicated: Secondary | ICD-10-CM | POA: Diagnosis not present

## 2022-10-16 NOTE — Telephone Encounter (Signed)
Faxed clearance over to [facility] at fax # provided,  [phone] .  Returned call to pt to make him aware.

## 2022-10-16 NOTE — Telephone Encounter (Signed)
  Pt is calling to follow up clearance. Pt said per [facility] they did not receive clearance yet

## 2022-10-19 NOTE — Telephone Encounter (Signed)
   Primary Cardiologist: Olga Millers, MD  Chart reviewed as part of pre-operative protocol coverage. Given past medical history and time since last visit, based on ACC/AHA guidelines, Gregory Wilson would be at acceptable risk for the planned procedure without further cardiovascular testing.   Patient was advised that if he develops new symptoms prior to surgery to contact our office to arrange a follow-up appointment.  He verbalized understanding.  Ideally aspirin should be continued without interruption, however if the bleeding risk is too great, aspirin may be held for 5-7 days prior to surgery. Please resume aspirin post operatively when it is felt to be safe from a bleeding standpoint.   I will route this recommendation to the requesting party via Epic fax function and remove from pre-op pool.  Please call with questions.  Levi Aland, NP-C  10/19/2022, 10:27 AM [address], [address] Office [phone] Fax [phone]

## 2022-11-06 NOTE — Progress Notes (Signed)
Surgery orders requested via Epic inbox. °

## 2022-11-08 DIAGNOSIS — G894 Chronic pain syndrome: Secondary | ICD-10-CM | POA: Diagnosis not present

## 2022-11-08 DIAGNOSIS — M47816 Spondylosis without myelopathy or radiculopathy, lumbar region: Secondary | ICD-10-CM | POA: Diagnosis not present

## 2022-11-08 DIAGNOSIS — F1120 Opioid dependence, uncomplicated: Secondary | ICD-10-CM | POA: Diagnosis not present

## 2022-11-08 DIAGNOSIS — M79631 Pain in right forearm: Secondary | ICD-10-CM | POA: Diagnosis not present

## 2022-11-08 DIAGNOSIS — M48061 Spinal stenosis, lumbar region without neurogenic claudication: Secondary | ICD-10-CM | POA: Diagnosis not present

## 2022-11-08 DIAGNOSIS — Z79891 Long term (current) use of opiate analgesic: Secondary | ICD-10-CM | POA: Diagnosis not present

## 2022-11-08 DIAGNOSIS — M25561 Pain in right knee: Secondary | ICD-10-CM | POA: Diagnosis not present

## 2022-11-08 DIAGNOSIS — M545 Low back pain, unspecified: Secondary | ICD-10-CM | POA: Diagnosis not present

## 2022-11-08 DIAGNOSIS — G8929 Other chronic pain: Secondary | ICD-10-CM | POA: Diagnosis not present

## 2022-11-12 DIAGNOSIS — M1711 Unilateral primary osteoarthritis, right knee: Secondary | ICD-10-CM | POA: Diagnosis not present

## 2022-11-13 ENCOUNTER — Encounter (HOSPITAL_COMMUNITY)
Admission: RE | Admit: 2022-11-13 | Discharge: 2022-11-13 | Disposition: A | Discharge status: Home / Self Care | Payer: Medicare PPO | Source: Ambulatory Visit | Attending: Internal Medicine | Admitting: Internal Medicine

## 2022-11-14 NOTE — Progress Notes (Signed)
Sent message, via epic in basket, requesting orders in epic from surgeon.  

## 2022-11-19 NOTE — Progress Notes (Signed)
Second request for pre op orders in [facility], left voicemail with Rosalva Ferron.

## 2022-11-20 ENCOUNTER — Telehealth: Payer: Self-pay | Admitting: Cardiology

## 2022-11-20 NOTE — Progress Notes (Addendum)
Could you please send orders for PAT appointment 11/21/22

## 2022-11-20 NOTE — Telephone Encounter (Signed)
Pt c/o medication issue:  1. Name of Medication: Furosemide  2. How are you currently taking this medication (dosage and times per day)?  1 time a day  3. Are you having a reaction (difficulty breathing--STAT)?   4. What is your medication issue? Patient said he saw his surgeon yesterday. He wanted to know if patient could increase his Furosemide to 2 times a day. Patient said the doctor said he had a lot of fluid    Pt c/o swelling: STAT is pt has developed SOB within 24 hours  If swelling, where is the swelling located?  Right leg  How much weight have you gained and in what time span?   Have you gained 3 pounds in a day or 5 pounds in a week?   Do you have a log of your daily weights (if so, list)?   Are you currently taking a fluid pill? yes  Are you currently SOB? no  Have you traveled recently? no t

## 2022-11-20 NOTE — Progress Notes (Addendum)
COVID Vaccine received:  []  No [x]  Yes Date of any COVID positive Test in last 16 days:No  PCP - Evette Doffing MD Cardiologist - Olga Millers MD  Chest x-ray - More than 3 years EKG -  02/22/22 EPIC Stress Test - 05/15/18 EPIC ECHO - 05/31/21 EPIC Cardiac Cath - 03/07/2017 EPIC  Cardiac Clearance 10/01/22 [facility] Medical clearance Dr. Evette Doffing 10/18/22  Bowel Prep - [x]  No  []   Yes ______  Pacemaker / ICD device [x]  No []  Yes   Spinal Cord Stimulator:[x]  No []  Yes       History of Sleep Apnea? [x]  No []  Yes   CPAP used?- [x]  No []  Yes    Does the patient monitor blood sugar?          [x]  No []  Yes  []  N/A  Patient has: [x]  NO Hx DM   []  Pre-DM                 []  DM1  []   DM2 Does patient have a [employer] or Dexacom? [x]  No []  Yes   Fasting Blood Sugar Ranges-  Checks Blood Sugar _____ times a day  GLP1 agonist / usual dose - No GLP1 instructions:  SGLT-2 inhibitors / usual dose - No SGLT-2 instructions:   Blood Thinner / Instructions: Aspirin Instructions:ASA 81mg   Dr. Nilsa Nutting PA instructed pt. To stop ASA 5 days prior to surgery.  Comments: Pt. Unable to bend neck back. He states he needs surgery to reoair. Pt. Has Butrans patch on arm for pain . Per Shanda Bumps PA OK to leave on DOS.  Activity level: Patient is able to climb a flight of stairs without difficulty; [x]  No CP  [x]  No SOB, but would have _difficulty d/t knee__   Patient can  perform ADLs without assistance.   Anesthesia review: Heart attack, stents,CABG. HTN  Patient denies shortness of breath, fever, cough and chest pain at PAT appointment.  Patient verbalized understanding and agreement to the Pre-Surgical Instructions that were given to them at this PAT appointment. Patient was also educated of the need to review these PAT instructions again prior to his/her surgery.I reviewed the appropriate phone numbers to call if they have any and questions or concerns.

## 2022-11-20 NOTE — Telephone Encounter (Signed)
Patient called stating he saw ortho surgeon yesterday and they stated he has fluid in his knee and wanted to know if he can increase lasix to 40 mg daily to help get rid of the fluid before surgery. His surgery date is 11/27/22.  His lasix is 20 mg daily as needed but he has not been taking it. He did take 20 mg after seeing ortho yesterday and 20 mg today.  He states they told him on the 5/13 he weighed 282 lbs, and today he weighed 301 lbs. He denies any chest pain or discomfort, shortness of breath, nausea or vomiting, headache. or dizziness. He states he does not feel like he gained 19 lbs in a week and he has not been eating much. They want him to increase lasix and take all week and they will recheck his weight on Friday.

## 2022-11-20 NOTE — Telephone Encounter (Signed)
Spoke with pt, Aware of dr [name] recommendations.  He is having pre-op labs tomorrow, will watch for those results.

## 2022-11-20 NOTE — Patient Instructions (Signed)
SURGICAL WAITING ROOM VISITATION  Patients having surgery or a procedure may have no more than 2 support people in the waiting area - these visitors may rotate.    Children under the age of 102 must have an adult with them who is not the patient.  Due to an increase in RSV and influenza rates and associated hospitalizations, children ages 63 and under may not visit patients in [facility] hospitals.  If the patient needs to stay at the hospital during part of their recovery, the visitor guidelines for inpatient rooms apply. Pre-op nurse will coordinate an appropriate time for 1 support person to accompany patient in pre-op.  This support person may not rotate.    Please refer to the [facility] website for the visitor guidelines for Inpatients (after your surgery is over and you are in a regular room).       Your procedure is scheduled on: 11/25/2022   Report to [facility] Main Entrance    Report to admitting at 7:35 AM   Call this number if you have problems the morning of surgery [phone]   Do not eat food :After Midnight.   After Midnight you may have the following liquids until 7:05 AM DAY OF SURGERY  Water Non-Citrus Juices (without pulp, NO RED-Apple, White grape, White cranberry) Black Coffee (NO MILK/CREAM OR CREAMERS, sugar ok)  Clear Tea (NO MILK/CREAM OR CREAMERS, sugar ok) regular and decaf                             Plain Jell-O (NO RED)                                           Fruit ices (not with fruit pulp, NO RED)                                     Popsicles (NO RED)                                                               Sports drinks like Gatorade (NO RED)               The day of surgery:  Drink ONE (1) Pre-Surgery Clear Ensure  at 7:05 AM the morning of surgery. Drink in one sitting. Do not sip.  This drink was given to you during your hospital  pre-op appointment visit. Nothing else to drink after completing the  Pre-Surgery Clear  Ensure or G2.          If you have questions, please contact your surgeon's office.   FOLLOW BOWEL PREP AND ANY ADDITIONAL PRE OP INSTRUCTIONS YOU RECEIVED FROM YOUR SURGEON'S OFFICE!!!     Oral Hygiene is also important to reduce your risk of infection.                                    Remember - BRUSH YOUR TEETH THE MORNING OF SURGERY WITH YOUR REGULAR  TOOTHPASTE  DENTURES WILL BE REMOVED PRIOR TO SURGERY PLEASE DO NOT APPLY "Poly grip" OR ADHESIVES!!!   Do NOT smoke after Midnight   Take these medicines the morning of surgery with A SIP OF WATER: Amlodipine, Atorvastatin, Esomeprazole, Metoprolol                               You may not have any metal on your body including jewelry, and body piercing             Do not wear lotions, powders, cologne, or deodorant              Men may shave face and neck.   Do not bring valuables to the hospital. [facility] IS NOT             RESPONSIBLE   FOR VALUABLES.   Contacts, glasses, dentures or bridgework may not be worn into surgery.   Bring small overnight bag day of surgery.   DO NOT BRING YOUR HOME MEDICATIONS TO THE HOSPITAL. PHARMACY WILL DISPENSE MEDICATIONS LISTED ON YOUR MEDICATION LIST TO YOU DURING YOUR ADMISSION IN THE HOSPITAL!   Special Instructions: Bring a copy of your healthcare power of attorney and living will documents the day of surgery if you haven't scanned them before.              Please read over the following fact sheets you were given: IF YOU HAVE QUESTIONS ABOUT YOUR PRE-OP INSTRUCTIONS PLEASE CALL [name]   If you received a COVID test during your pre-op visit  it is requested that you wear a mask when out in public, stay away from anyone that may not be feeling well and notify your surgeon if you develop symptoms. If you test positive for Covid or have been in contact with anyone that has tested positive in the last 10 days please notify you surgeon.      Pre-operative 5 CHG Bath  Instructions   You can play a key role in reducing the risk of infection after surgery. Your skin needs to be as free of germs as possible. You can reduce the number of germs on your skin by washing with CHG (chlorhexidine gluconate) soap before surgery. CHG is an antiseptic soap that kills germs and continues to kill germs even after washing.   DO NOT use if you have an allergy to chlorhexidine/CHG or antibacterial soaps. If your skin becomes reddened or irritated, stop using the CHG and notify one of our RNs at [phone].   Please shower with the CHG soap starting 4 days before surgery using the following schedule:     Please keep in mind the following:  DO NOT shave, including legs and underarms, starting the day of your first shower.   You may shave your face at any point before/day of surgery.  Place clean sheets on your bed the day you start using CHG soap. Use a clean washcloth (not used since being washed) for each shower. DO NOT sleep with pets once you start using the CHG.   CHG Shower Instructions:  If you choose to wash your hair and private area, wash first with your normal shampoo/soap.  After you use shampoo/soap, rinse your hair and body thoroughly to remove shampoo/soap residue.  Turn the water OFF and apply about 3 tablespoons (45 ml) of CHG soap to a CLEAN washcloth.  Apply CHG soap ONLY FROM YOUR NECK DOWN  TO YOUR TOES (washing for 3-5 minutes)  DO NOT use CHG soap on face, private areas, open wounds, or sores.  Pay special attention to the area where your surgery is being performed.  If you are having back surgery, having someone wash your back for you may be helpful. Wait 2 minutes after CHG soap is applied, then you may rinse off the CHG soap.  Pat dry with a clean towel  Put on clean clothes/pajamas   If you choose to wear lotion, please use ONLY the CHG-compatible lotions on the back of this paper.     Additional instructions for the day of surgery: DO NOT  APPLY any lotions, deodorants, cologne, or perfumes.   Put on clean/comfortable clothes.  Brush your teeth.  Ask your nurse before applying any prescription medications to the skin.      CHG Compatible Lotions   Aveeno Moisturizing lotion  Cetaphil Moisturizing Cream  Cetaphil Moisturizing Lotion  Clairol Herbal Essence Moisturizing Lotion, Dry Skin  Clairol Herbal Essence Moisturizing Lotion, Extra Dry Skin  Clairol Herbal Essence Moisturizing Lotion, Normal Skin  Curel Age Defying Therapeutic Moisturizing Lotion with Alpha Hydroxy  Curel Extreme Care Body Lotion  Curel Soothing Hands Moisturizing Hand Lotion  Curel Therapeutic Moisturizing Cream, Fragrance-Free  Curel Therapeutic Moisturizing Lotion, Fragrance-Free  Curel Therapeutic Moisturizing Lotion, Original Formula  Eucerin Daily Replenishing Lotion  Eucerin Dry Skin Therapy Plus Alpha Hydroxy Crme  Eucerin Dry Skin Therapy Plus Alpha Hydroxy Lotion  Eucerin Original Crme  Eucerin Original Lotion  Eucerin Plus Crme Eucerin Plus Lotion  Eucerin TriLipid Replenishing Lotion  Keri Anti-Bacterial Hand Lotion  Keri Deep Conditioning Original Lotion Dry Skin Formula Softly Scented  Keri Deep Conditioning Original Lotion, Fragrance Free Sensitive Skin Formula  Keri Lotion Fast Absorbing Fragrance Free Sensitive Skin Formula  Keri Lotion Fast Absorbing Softly Scented Dry Skin Formula  Keri Original Lotion  Keri Skin Renewal Lotion Keri Silky Smooth Lotion  Keri Silky Smooth Sensitive Skin Lotion  Nivea Body Creamy Conditioning Oil  Nivea Body Extra Enriched Teacher, adult education Moisturizing Lotion Nivea Crme  Nivea Skin Firming Lotion  NutraDerm 30 Skin Lotion  NutraDerm Skin Lotion  NutraDerm Therapeutic Skin Cream  NutraDerm Therapeutic Skin Lotion  ProShield Protective Hand Cream  Provon moisturizing lotion

## 2022-11-20 NOTE — Progress Notes (Signed)
COVID Vaccine received:  []  No []  Yes Date of any COVID positive Test in last 90 days:   PCP - Nita Sells MD Cardiologist - Olga Millers MD   Chest x-ray -  EKG -  02/22/22 EPIC Stress Test - 05/15/18 EPIC ECHO - 05/31/21 EPIC Cardiac Cath - 03/07/2017 EPIC   Cardiac Clearance 10/01/22 Jennifer WittyNP   Bowel Prep - []  No  []   Yes ______   Pacemaker / ICD device []  No []  Yes   Spinal Cord Stimulator:[]  No []  Yes       History of Sleep Apnea? []  No []  Yes   CPAP used?- []  No []  Yes     Does the patient monitor blood sugar?          []  No []  Yes  []  N/A   Patient has: []  NO Hx DM   []  Pre-DM                 []  DM1  []   DM2 Does patient have a [employer] or Dexacom? []  No []  Yes   Fasting Blood Sugar Ranges-  Checks Blood Sugar _____ times a day   GLP1 agonist / usual dose -  GLP1 instructions:  SGLT-2 inhibitors / usual dose -  SGLT-2 instructions:    Blood Thinner / Instructions: Aspirin Instructions:   Comments:    Activity level: Patient is able / unable to climb a flight of stairs without difficulty; []  No CP  []  No SOB, but would have ___   Patient can / can not perform ADLs without assistance.    Anesthesia review:    Patient denies shortness of breath, fever, cough and chest pain at PAT appointment.   Patient verbalized understanding and agreement to the Pre-Surgical Instructions that were given to them at this PAT appointment. Patient was also educated of the need to review these PAT instructions again prior to his/her surgery.I reviewed the appropriate phone numbers to call if they have any and questions or concern

## 2022-11-21 ENCOUNTER — Ambulatory Visit: Admit: 2022-11-21 | Payer: Medicare PPO | Admitting: Specialist

## 2022-11-21 ENCOUNTER — Other Ambulatory Visit: Payer: Self-pay

## 2022-11-21 ENCOUNTER — Telehealth: Payer: Self-pay | Admitting: *Deleted

## 2022-11-21 ENCOUNTER — Encounter (HOSPITAL_COMMUNITY)
Admission: RE | Admit: 2022-11-21 | Discharge: 2022-11-21 | Disposition: A | Discharge status: Home / Self Care | Payer: Medicare PPO | Source: Ambulatory Visit | Attending: Orthopedic Surgery | Admitting: Orthopedic Surgery

## 2022-11-21 ENCOUNTER — Encounter (HOSPITAL_COMMUNITY): Payer: Self-pay

## 2022-11-21 VITALS — BP 153/85 | HR 89 | Temp 98.3°F | Resp 20 | Ht 71.0 in | Wt 297.0 lb

## 2022-11-21 DIAGNOSIS — G4733 Obstructive sleep apnea (adult) (pediatric): Secondary | ICD-10-CM | POA: Diagnosis not present

## 2022-11-21 DIAGNOSIS — M1711 Unilateral primary osteoarthritis, right knee: Secondary | ICD-10-CM | POA: Insufficient documentation

## 2022-11-21 DIAGNOSIS — I10 Essential (primary) hypertension: Secondary | ICD-10-CM

## 2022-11-21 DIAGNOSIS — I252 Old myocardial infarction: Secondary | ICD-10-CM | POA: Insufficient documentation

## 2022-11-21 DIAGNOSIS — Z955 Presence of coronary angioplasty implant and graft: Secondary | ICD-10-CM | POA: Diagnosis not present

## 2022-11-21 DIAGNOSIS — Z01818 Encounter for other preprocedural examination: Secondary | ICD-10-CM | POA: Insufficient documentation

## 2022-11-21 DIAGNOSIS — I2510 Atherosclerotic heart disease of native coronary artery without angina pectoris: Secondary | ICD-10-CM | POA: Insufficient documentation

## 2022-11-21 LAB — BASIC METABOLIC PANEL
Anion gap: 9 (ref 5–15)
BUN: 16 mg/dL (ref 8–23)
CO2: 23 mmol/L (ref 22–32)
Calcium: 9.1 mg/dL (ref 8.9–10.3)
Chloride: 105 mmol/L (ref 98–111)
Creatinine, Ser: 1.15 mg/dL (ref 0.61–1.24)
GFR, Estimated: >60 mL/min (ref >60)
Glucose, Bld: 93 mg/dL (ref 70–99)
Potassium: 4.2 mmol/L (ref 3.5–5.1)
Sodium: 137 mmol/L (ref 135–145)

## 2022-11-21 LAB — CBC
HCT: 41.9 % (ref 39.0–52.0)
Hemoglobin: 13.1 g/dL (ref 13.0–17.0)
MCH: 25.1 pg — ABNORMAL LOW (ref 26.0–34.0)
MCHC: 31.3 g/dL (ref 30.0–36.0)
MCV: 80.4 fL (ref 80.0–100.0)
Platelets: 228 10*3/uL (ref 150–400)
RBC: 5.21 MIL/uL (ref 4.22–5.81)
RDW: 20 % — ABNORMAL HIGH (ref 11.5–15.5)
WBC: 5.8 10*3/uL (ref 4.0–10.5)
nRBC: 0 % (ref 0.0–0.2)

## 2022-11-21 LAB — SURGICAL PCR SCREEN
MRSA, PCR: NEGATIVE
Staphylococcus aureus: NEGATIVE

## 2022-11-21 SURGERY — ARTHROPLASTY, KNEE, TOTAL
Anesthesia: Spinal | Site: Knee | Laterality: Right

## 2022-11-21 NOTE — Telephone Encounter (Signed)
   Pre-operative Risk Assessment    Patient Name: Gregory Wilson  DOB: [birth year] MRN: [id number]      Request for Surgical Clearance    Procedure:   RIGHT TOTAL KNEE ARTHROPLASTY  Date of Surgery:  Clearance TBD                                 Surgeon:  DR. Benny Lennert Surgeon's Group or Practice Name:  [facility] Phone number:  [phone] ATTN: KERRI MAZE Fax number:  [phone]   Type of Clearance Requested:   - Medical ; ASA   Type of Anesthesia:  Spinal   Additional requests/questions:    [name]   11/21/2022, 5:50 PM

## 2022-11-21 NOTE — Telephone Encounter (Signed)
   Patient Name: Gregory Wilson  DOB: [birth year] MRN: [id number]  Primary Cardiologist: Olga Millers, MD  Chart reviewed as part of pre-operative protocol coverage. Given past medical history and time since last visit, based on ACC/AHA guidelines, Gregory Wilson is at acceptable risk for the planned procedure without further cardiovascular testing.   The patient was advised that if he develops new symptoms prior to surgery to contact our office to arrange for a follow-up visit, and he verbalized understanding.  Regarding ASA therapy, it may be stopped 5-7 days prior to surgery with a plan to resume it as soon as felt to be feasible from a surgical standpoint in the post-operative period.   I will route this recommendation to the requesting party via Epic fax function and remove from pre-op pool.  Please call with questions.  Napoleon Form, Leodis Rains, NP 11/21/2022, 7:42 PM

## 2022-11-22 ENCOUNTER — Encounter (HOSPITAL_COMMUNITY): Payer: Self-pay

## 2022-11-22 DIAGNOSIS — M25561 Pain in right knee: Secondary | ICD-10-CM | POA: Diagnosis not present

## 2022-11-22 NOTE — Anesthesia Preprocedure Evaluation (Addendum)
Anesthesia Evaluation  Patient identified by MRN, date of birth, ID band Patient awake    Reviewed: Allergy & Precautions, H&P , NPO status , Patient's Chart, lab work & pertinent test results  Airway Mallampati: II  TM Distance: >3 FB Neck ROM: Full    Dental no notable dental hx.    Pulmonary sleep apnea    Pulmonary exam normal breath sounds clear to auscultation       Cardiovascular hypertension, Pt. on medications + CAD, + Past MI and + Cardiac Stents  Normal cardiovascular exam+ dysrhythmias Atrial Fibrillation  Rhythm:Regular Rate:Normal     Neuro/Psych negative neurological ROS  negative psych ROS   GI/Hepatic Neg liver ROS,GERD  ,,  Endo/Other    Morbid obesity  Renal/GU negative Renal ROS  negative genitourinary   Musculoskeletal  (+) Arthritis , Osteoarthritis,    Abdominal  (+) + obese  Peds negative pediatric ROS (+)  Hematology negative hematology ROS (+)   Anesthesia Other Findings   Reproductive/Obstetrics negative OB ROS                             Anesthesia Physical Anesthesia Plan  ASA: 3  Anesthesia Plan: MAC and Spinal   Post-op Pain Management: Regional block* and Minimal or no pain anticipated   Induction: Intravenous  PONV Risk Score and Plan: 1 and Ondansetron and Treatment may vary due to age or medical condition  Airway Management Planned: Simple Face Mask  Additional Equipment:   Intra-op Plan:   Post-operative Plan:   Informed Consent: I have reviewed the patients History and Physical, chart, labs and discussed the procedure including the risks, benefits and alternatives for the proposed anesthesia with the patient or authorized representative who has indicated his/her understanding and acceptance.     Dental advisory given  Plan Discussed with: CRNA  Anesthesia Plan Comments: (See PAT note from 5/22 by K Gekas-PA-C. Pt has Butrans patch  and was asked to talk to pain clinic to see what they want to do)        Anesthesia Quick Evaluation

## 2022-11-22 NOTE — Progress Notes (Signed)
Case: [id number] Date/Time: 11/27/22 0950   Procedure: TOTAL KNEE ARTHROPLASTY (Right: Knee)   Anesthesia type: Spinal   Pre-op diagnosis: Right knee osteoarthritis   Location: [facility] ROOM 09 / [facility]   Surgeons: Gregory Romans, MD       DISCUSSION: Gregory Wilson is an 82 yo male who presents to PAT prior to surgery above. PMH significant for hx of STEMI (2018), CAD s/p stent (2003), abdominal aortic ectasia, hx of A. Flutter (not anticoagulated), OSA, GERD, hiatal hernia, hx of esophageal dilation, chronic neck/back pain on Butrans patch followed by pain management. Had a decompressive laminectomy of bilateral T9/T10, T10/11 on 03/28/22 without issues. No prior anesthesia complications.  Patient last seen in Cardiology clinic on 02/22/22 by Gregory Wilson. Did not complain of any cardiac symptoms at that time. Had a telephone preoperative risk assessment visit on 10/01/22:  "Gregory Wilson's perioperative risk of a major cardiac event is 6.6% according to the Revised Cardiac Risk Index (RCRI).  Therefore, he is at moderate risk for perioperative complications.   His functional capacity is fair at 4.3 METs according to the Duke Activity Status Index (DASI). Recommendations: According to ACC/AHA guidelines, no further cardiovascular testing needed.  The patient may proceed to surgery at acceptable risk.   Antiplatelet and/or Anticoagulation Recommendations: Regarding ASA therapy, it may be stopped 5-7 days prior to surgery with a plan to resume it as soon as felt to be feasible from a surgical standpoint in the post-operative period. "  Patient had a surveillance abdominal US on 03/07/22 which did not show a AAA.  All other chronic medical issues have been stable. Medical clearance provided by PCP Gregory Wilson on 4/18. HgA1c was 6.8 and patient was considered medically optimized and low risk.  Of note patient goes to pain management. Patient has not spoken with them regarding his Butrans patch when I called him  on 5/23. I encouraged him to call them to let them know he is having sugery and if they have recommendations regarding his patch. He states that for his last back surgery he continued it up until DOS. He also has difficulty with ROM of neck and will need surgery on his neck soon too.   VS: BP (!) 153/85   Pulse 89   Temp 36.8 C (Oral)   Resp 20   Ht 5\' 11"  (1.803 m)   Wt 134.7 kg   SpO2 98%   BMI 41.42 kg/m   PROVIDERS: Gregory Stabile, MD   LABS: Labs reviewed: Acceptable for surgery. (all labs ordered are listed, but only abnormal results are displayed)  Labs Reviewed  CBC - Abnormal; Notable for the following components:      Result Value   MCH 25.1 (*)    RDW 20.0 (*)    All other components within normal limits  SURGICAL PCR SCREEN  BASIC METABOLIC PANEL     IMAGES: n/a   EKG 02/22/22:  normal sinus rhythm at a rate of 68, right bundle branch block.  Reviewed by Gregory Wilson   CV:   [place] AAA 03/08/22:  Summary:  Abdominal Aorta: No evidence of an abdominal aortic aneurysm was  visualized. The largest aortic measurement is 2.3 cm.  Stenosis:  No evidence of stenosis seen. Limited view of left iliac arteries due to  presence of pelvic kidney. SEE NOTES ABOVE   IVC/Iliac: There is no evidence of thrombus involving the IVC.   Echo 05/31/21:  IMPRESSIONS     1. Left ventricular ejection  fraction, by estimation, is 60 to 65%. The  left ventricle has normal function. The left ventricle has no regional  wall motion abnormalities. There is moderate left ventricular hypertrophy.  Left ventricular diastolic  parameters are consistent with Grade I diastolic dysfunction (impaired  relaxation).   2. Right ventricular systolic function is normal. The right ventricular  size is normal. Tricuspid regurgitation signal is inadequate for assessing  PA pressure.   3. The mitral valve is normal in structure. No evidence of mitral valve  regurgitation.   4. The aortic  valve is tricuspid. Aortic valve regurgitation is mild.  Aortic valve sclerosis/calcification is present, without any evidence of  aortic stenosis.   Per Gregory Wilson: "Normal LV function; mild AI."   Carotid US 05/17/21:  Summary:  Right Carotid: There is no evidence of stenosis in the right ICA. There  was no evidence of thrombus, dissection, atherosclerotic plaque or stenosis in the cervical carotid system.   Left Carotid: There is no evidence of stenosis in the left ICA. The  extracranial vessels were near-normal with only minimal wall thickening or plaque.   Vertebrals:  Bilateral vertebral arteries demonstrate antegrade flow.  Subclavians: Normal flow hemodynamics were seen in bilateral subclavian arteries.    Past Medical History:  Diagnosis Date   Acute ST elevation myocardial infarction (STEMI) of anterior wall (HCC) 03/07/2017   Arthritis    BPH (benign prostatic hypertrophy)    CAD (coronary artery disease)    a. Lexiscan Myoview (11/2013):  Normal stress nuclear study.  LV Ejection Fraction: 65%   Complication of anesthesia    hx PONV   Degenerative joint disease    GERD (gastroesophageal reflux disease)    H/O hiatal hernia    History of atrial flutter    REMOTE HX ON TREADMILL   History of colon polyps    Hyperlipidemia    Hypertension    IBS (irritable bowel syndrome)    Lower urinary tract symptoms (LUTS)    OSA (obstructive sleep apnea) 03/26/2016   RBBB (right bundle branch block)    S/P drug eluting coronary stent placement    11-26-2001   X2 DES  TO LAD &  BMS   Schatzki's ring    Status post dilation of esophageal narrowing     Past Surgical History:  Procedure Laterality Date   APPENDECTOMY     CARDIOVASCULAR STRESS TEST  12/09/2013   NUCLEAR LEXISCAN STUDY--  RESULTS PENDING   CARDIOVASCULAR STRESS TEST  04/2011  DR CRENSHAW   NORMAL /  NO ISCHEMIA/  EF 67%   CHOLECYSTECTOMY     COLONOSCOPY  05/29/2011   RMR: tubular adenoma   COLONOSCOPY  WITH PROPOFOL N/A 05/23/2020   ;  Surgeon: Corbin Ade, MD;  Nonbleeding internal hemorrhoids, diverticulosis in sigmoid and descending colon, 10 polyps removed.Pathology revealed tubular adenomas.  Recommended repeat colonoscopy in 3 years if health permits.   CORONARY ANGIOPLASTY WITH STENT PLACEMENT  11-26-2001  DR Charlies Constable   DES (Cypher) to LAD 70%/  BMS (Express) to OSTIAL D1 80%/   CFX  &  RCA normal/  EF 65%   CORONARY/GRAFT ACUTE MI REVASCULARIZATION N/A 03/07/2017   Procedure: Coronary/Graft Acute MI Revascularization;  Surgeon: Tonny Bollman, MD;  [facility] INVASIVE CV LAB;  Service: Cardiovascular;  Laterality: N/A;   ESOPHAGOGASTRODUODENOSCOPY  04/05/2004   RMR: Small hiatal hernia/Questionable subtle Schatzki's ring, status post dilation as described   ESOPHAGOGASTRODUODENOSCOPY  05/2011   RMR: small hiatal hernia, empiric dilation  with 27F and 12F Maloney   ESOPHAGOGASTRODUODENOSCOPY (EGD) WITH ESOPHAGEAL DILATION N/A 01/16/2013   Dr. Rourk:baggy somewhat atonic esophagus-query underlying esophageal motility disorder. Status post passage of a Maloney dilator (large bore) empirically. Status post esophageal biopsy/Reflux symptoms are well controlled on Nexium.benign path   ESOPHAGOGASTRODUODENOSCOPY (EGD) WITH PROPOFOL N/A 05/23/2020   Surgeon: Corbin Ade, MD; Somewhat baggy esophageal body, otherwise normal esophagus s/p empiric dilation, small hiatal hernia, otherwise normal exam.   LEFT HEART CATH AND CORONARY ANGIOGRAPHY N/A 03/07/2017   Procedure: LEFT HEART CATH AND CORONARY ANGIOGRAPHY;  Surgeon: Tonny Bollman, MD;  Location: [facility] INVASIVE CV LAB;  Service: Cardiovascular;  Laterality: N/A;   MALONEY DILATION N/A 05/23/2020   Procedure: Elease Hashimoto DILATION;  Surgeon: Corbin Ade, MD;  [facility];  Service: Endoscopy;  Laterality: N/A;   POLYPECTOMY  05/23/2020   Procedure: POLYPECTOMY;  Surgeon: Corbin Ade, MD;  [facility];   Service: Endoscopy;;   TOTAL HIP ARTHROPLASTY Left 09/07/2003   TOTAL KNEE ARTHROPLASTY Left 07/27/2002   TRANSURETHRAL RESECTION OF PROSTATE N/A 12/21/2013   Procedure: TRANSURETHRAL RESECTION OF THE PROSTATE WITH GYRUS INSTRUMENTS;  Surgeon: Chelsea Aus, MD;  Location: [facility];  Service: Urology;  Laterality: N/A;    MEDICATIONS:  amLODipine (NORVASC) 10 MG tablet   aspirin EC 81 MG tablet   atorvastatin (LIPITOR) 80 MG tablet   buprenorphine (BUTRANS) 10 MCG/HR PTWK   esomeprazole (NEXIUM) 20 MG capsule   furosemide (LASIX) 20 MG tablet   irbesartan (AVAPRO) 300 MG tablet   methocarbamol (ROBAXIN) 750 MG tablet   metoprolol succinate (TOPROL-XL) 100 MG 24 hr tablet   No current facility-administered medications for this encounter.   Marcille Blanco [facility] Surgical Short Stay/Anesthesiology [facility] Phone [phone] 11/22/2022 9:56 AM

## 2022-11-23 DIAGNOSIS — M1711 Unilateral primary osteoarthritis, right knee: Secondary | ICD-10-CM | POA: Diagnosis not present

## 2022-11-27 ENCOUNTER — Ambulatory Visit (HOSPITAL_COMMUNITY): Payer: Medicare PPO | Admitting: Medical

## 2022-11-27 ENCOUNTER — Other Ambulatory Visit: Payer: Self-pay

## 2022-11-27 ENCOUNTER — Ambulatory Visit (HOSPITAL_BASED_OUTPATIENT_CLINIC_OR_DEPARTMENT_OTHER): Payer: Medicare PPO | Admitting: Certified Registered"

## 2022-11-27 ENCOUNTER — Observation Stay (HOSPITAL_COMMUNITY)
Admission: RE | Admit: 2022-11-27 | Discharge: 2022-11-28 | Disposition: A | Discharge status: Home Health | Payer: Medicare PPO | Attending: Orthopedic Surgery | Admitting: Orthopedic Surgery

## 2022-11-27 ENCOUNTER — Encounter (HOSPITAL_COMMUNITY)
Admission: RE | Disposition: A | Discharge status: Home Health | Payer: Self-pay | Source: Home / Self Care | Attending: Orthopedic Surgery

## 2022-11-27 ENCOUNTER — Encounter (HOSPITAL_COMMUNITY): Payer: Self-pay | Admitting: Orthopedic Surgery

## 2022-11-27 DIAGNOSIS — Z96652 Presence of left artificial knee joint: Secondary | ICD-10-CM | POA: Diagnosis not present

## 2022-11-27 DIAGNOSIS — I10 Essential (primary) hypertension: Secondary | ICD-10-CM

## 2022-11-27 DIAGNOSIS — Z79899 Other long term (current) drug therapy: Secondary | ICD-10-CM | POA: Diagnosis not present

## 2022-11-27 DIAGNOSIS — I252 Old myocardial infarction: Secondary | ICD-10-CM

## 2022-11-27 DIAGNOSIS — M25761 Osteophyte, right knee: Secondary | ICD-10-CM

## 2022-11-27 DIAGNOSIS — M1711 Unilateral primary osteoarthritis, right knee: Principal | ICD-10-CM | POA: Insufficient documentation

## 2022-11-27 DIAGNOSIS — M25461 Effusion, right knee: Secondary | ICD-10-CM | POA: Diagnosis not present

## 2022-11-27 DIAGNOSIS — Z9861 Coronary angioplasty status: Secondary | ICD-10-CM | POA: Insufficient documentation

## 2022-11-27 DIAGNOSIS — Z96642 Presence of left artificial hip joint: Secondary | ICD-10-CM | POA: Insufficient documentation

## 2022-11-27 DIAGNOSIS — Z01818 Encounter for other preprocedural examination: Secondary | ICD-10-CM

## 2022-11-27 DIAGNOSIS — Z7982 Long term (current) use of aspirin: Secondary | ICD-10-CM | POA: Insufficient documentation

## 2022-11-27 DIAGNOSIS — Z955 Presence of coronary angioplasty implant and graft: Secondary | ICD-10-CM | POA: Diagnosis not present

## 2022-11-27 DIAGNOSIS — Z96651 Presence of right artificial knee joint: Secondary | ICD-10-CM

## 2022-11-27 DIAGNOSIS — I2510 Atherosclerotic heart disease of native coronary artery without angina pectoris: Secondary | ICD-10-CM | POA: Insufficient documentation

## 2022-11-27 DIAGNOSIS — G8918 Other acute postprocedural pain: Secondary | ICD-10-CM | POA: Diagnosis not present

## 2022-11-27 HISTORY — PX: TOTAL KNEE ARTHROPLASTY: SHX125

## 2022-11-27 SURGERY — ARTHROPLASTY, KNEE, TOTAL
Anesthesia: Monitor Anesthesia Care | Site: Knee | Laterality: Right

## 2022-11-27 MED ORDER — ONDANSETRON HCL 4 MG PO TABS: 4.0000 mg | ORAL_TABLET | Freq: Four times a day (QID) | ORAL | Status: DC | PRN

## 2022-11-27 MED ORDER — MIDAZOLAM HCL 2 MG/2ML IJ SOLN
INTRAMUSCULAR | Status: AC
  Administered 2022-11-27: 1 mg via INTRAVENOUS
  Filled 2022-11-27: qty 2

## 2022-11-27 MED ORDER — METHOCARBAMOL 1000 MG/10ML IJ SOLN: 500.0000 mg | Freq: Four times a day (QID) | INTRAVENOUS | Status: DC | PRN

## 2022-11-27 MED ORDER — FUROSEMIDE 20 MG PO TABS: 20.0000 mg | ORAL_TABLET | Freq: Every day | ORAL | Status: DC | PRN

## 2022-11-27 MED ORDER — IRBESARTAN 150 MG PO TABS
300.0000 mg | ORAL_TABLET | Freq: Every day | ORAL | Status: DC
  Administered 2022-11-28: 300 mg via ORAL
  Filled 2022-11-27: qty 2

## 2022-11-27 MED ORDER — DEXAMETHASONE SODIUM PHOSPHATE 10 MG/ML IJ SOLN
INTRAMUSCULAR | Status: AC
  Filled 2022-11-27: qty 1

## 2022-11-27 MED ORDER — ROPIVACAINE HCL 5 MG/ML IJ SOLN
INTRAMUSCULAR | Status: DC | PRN
  Administered 2022-11-27: 20 mL via EPIDURAL

## 2022-11-27 MED ORDER — OXYCODONE HCL 5 MG PO TABS: 5.0000 mg | ORAL_TABLET | Freq: Once | ORAL | Status: DC | PRN

## 2022-11-27 MED ORDER — ORAL CARE MOUTH RINSE: 15.0000 mL | Freq: Once | OROMUCOSAL | Status: AC

## 2022-11-27 MED ORDER — TRANEXAMIC ACID-NACL 1000-0.7 MG/100ML-% IV SOLN
1000.0000 mg | Freq: Once | INTRAVENOUS | Status: AC
  Administered 2022-11-27: 1000 mg via INTRAVENOUS
  Filled 2022-11-27: qty 100

## 2022-11-27 MED ORDER — SODIUM CHLORIDE 0.9 % IV SOLN: INTRAVENOUS | Status: DC

## 2022-11-27 MED ORDER — ACETAMINOPHEN 500 MG PO TABS
1000.0000 mg | ORAL_TABLET | Freq: Four times a day (QID) | ORAL | Status: DC
  Administered 2022-11-27 – 2022-11-28 (×3): 1000 mg via ORAL
  Filled 2022-11-27 (×3): qty 2

## 2022-11-27 MED ORDER — MENTHOL 3 MG MT LOZG: 1.0000 | LOZENGE | OROMUCOSAL | Status: DC | PRN

## 2022-11-27 MED ORDER — METOCLOPRAMIDE HCL 5 MG PO TABS: 5.0000 mg | ORAL_TABLET | Freq: Three times a day (TID) | ORAL | Status: DC | PRN

## 2022-11-27 MED ORDER — ONDANSETRON HCL 4 MG/2ML IJ SOLN: 4.0000 mg | Freq: Four times a day (QID) | INTRAMUSCULAR | Status: DC | PRN

## 2022-11-27 MED ORDER — LACTATED RINGERS IV SOLN: INTRAVENOUS | Status: DC

## 2022-11-27 MED ORDER — DEXAMETHASONE SODIUM PHOSPHATE 10 MG/ML IJ SOLN
10.0000 mg | Freq: Once | INTRAMUSCULAR | Status: AC
  Administered 2022-11-28: 10 mg via INTRAVENOUS
  Filled 2022-11-27: qty 1

## 2022-11-27 MED ORDER — PROPOFOL 500 MG/50ML IV EMUL
INTRAVENOUS | Status: DC | PRN
  Administered 2022-11-27: 125 ug/kg/min via INTRAVENOUS

## 2022-11-27 MED ORDER — DIPHENHYDRAMINE HCL 12.5 MG/5ML PO ELIX: 12.5000 mg | ORAL_SOLUTION | ORAL | Status: DC | PRN

## 2022-11-27 MED ORDER — PANTOPRAZOLE SODIUM 40 MG PO TBEC
40.0000 mg | DELAYED_RELEASE_TABLET | Freq: Every day | ORAL | Status: DC
  Administered 2022-11-28: 40 mg via ORAL
  Filled 2022-11-27: qty 1

## 2022-11-27 MED ORDER — KETOROLAC TROMETHAMINE 30 MG/ML IJ SOLN
INTRAMUSCULAR | Status: DC | PRN
  Administered 2022-11-27: 30 mg via INTRAMUSCULAR

## 2022-11-27 MED ORDER — SODIUM CHLORIDE 0.9 % IR SOLN
Status: DC | PRN
  Administered 2022-11-27: 1000 mL

## 2022-11-27 MED ORDER — 0.9 % SODIUM CHLORIDE (POUR BTL) OPTIME
TOPICAL | Status: DC | PRN
  Administered 2022-11-27: 1000 mL

## 2022-11-27 MED ORDER — AMLODIPINE BESYLATE 10 MG PO TABS
10.0000 mg | ORAL_TABLET | Freq: Every morning | ORAL | Status: DC
  Administered 2022-11-28: 10 mg via ORAL
  Filled 2022-11-27: qty 1

## 2022-11-27 MED ORDER — BUPIVACAINE IN DEXTROSE 0.75-8.25 % IT SOLN
INTRATHECAL | Status: DC | PRN
  Administered 2022-11-27: 1.8 mL via INTRATHECAL

## 2022-11-27 MED ORDER — MIDAZOLAM HCL 2 MG/2ML IJ SOLN: 1.0000 mg | INTRAMUSCULAR | Status: DC

## 2022-11-27 MED ORDER — METOCLOPRAMIDE HCL 5 MG/ML IJ SOLN: 5.0000 mg | Freq: Three times a day (TID) | INTRAMUSCULAR | Status: DC | PRN

## 2022-11-27 MED ORDER — BUPIVACAINE HCL (PF) 0.25 % IJ SOLN
INTRAMUSCULAR | Status: DC | PRN
  Administered 2022-11-27: 30 mL

## 2022-11-27 MED ORDER — DOCUSATE SODIUM 100 MG PO CAPS
100.0000 mg | ORAL_CAPSULE | Freq: Two times a day (BID) | ORAL | Status: DC
  Administered 2022-11-27 – 2022-11-28 (×2): 100 mg via ORAL
  Filled 2022-11-27 (×2): qty 1

## 2022-11-27 MED ORDER — ONDANSETRON HCL 4 MG/2ML IJ SOLN
INTRAMUSCULAR | Status: DC | PRN
  Administered 2022-11-27: 4 mg via INTRAVENOUS

## 2022-11-27 MED ORDER — FENTANYL CITRATE PF 50 MCG/ML IJ SOSY
50.0000 ug | PREFILLED_SYRINGE | Freq: Once | INTRAMUSCULAR | Status: AC
  Administered 2022-11-27: 50 ug via INTRAVENOUS
  Filled 2022-11-27: qty 2

## 2022-11-27 MED ORDER — BISACODYL 10 MG RE SUPP: 10.0000 mg | Freq: Every day | RECTAL | Status: DC | PRN

## 2022-11-27 MED ORDER — PROMETHAZINE HCL 25 MG/ML IJ SOLN: 6.2500 mg | INTRAMUSCULAR | Status: DC | PRN

## 2022-11-27 MED ORDER — OXYCODONE HCL 5 MG PO TABS: 10.0000 mg | ORAL_TABLET | ORAL | Status: DC | PRN

## 2022-11-27 MED ORDER — HYDROMORPHONE HCL 1 MG/ML IJ SOLN: 0.5000 mg | INTRAMUSCULAR | Status: DC | PRN

## 2022-11-27 MED ORDER — POLYETHYLENE GLYCOL 3350 17 G PO PACK
17.0000 g | PACK | Freq: Two times a day (BID) | ORAL | Status: DC
  Administered 2022-11-28: 17 g via ORAL
  Filled 2022-11-27: qty 1

## 2022-11-27 MED ORDER — SODIUM CHLORIDE (PF) 0.9 % IJ SOLN
INTRAMUSCULAR | Status: DC | PRN
  Administered 2022-11-27: 30 mL

## 2022-11-27 MED ORDER — PHENOL 1.4 % MT LIQD: 1.0000 | OROMUCOSAL | Status: DC | PRN

## 2022-11-27 MED ORDER — DEXAMETHASONE SODIUM PHOSPHATE 10 MG/ML IJ SOLN
8.0000 mg | Freq: Once | INTRAMUSCULAR | Status: AC
  Administered 2022-11-27: 8 mg via INTRAVENOUS

## 2022-11-27 MED ORDER — METHOCARBAMOL 500 MG PO TABS
500.0000 mg | ORAL_TABLET | Freq: Four times a day (QID) | ORAL | Status: DC | PRN
  Administered 2022-11-27 – 2022-11-28 (×3): 500 mg via ORAL
  Filled 2022-11-27 (×3): qty 1

## 2022-11-27 MED ORDER — EPHEDRINE SULFATE-NACL 50-0.9 MG/10ML-% IV SOSY
PREFILLED_SYRINGE | INTRAVENOUS | Status: DC | PRN
  Administered 2022-11-27: 5 mg via INTRAVENOUS
  Administered 2022-11-27 (×2): 10 mg via INTRAVENOUS

## 2022-11-27 MED ORDER — METOPROLOL SUCCINATE ER 50 MG PO TB24
100.0000 mg | ORAL_TABLET | Freq: Every day | ORAL | Status: DC
  Administered 2022-11-28: 100 mg via ORAL
  Filled 2022-11-27: qty 2

## 2022-11-27 MED ORDER — ACETAMINOPHEN 325 MG PO TABS: 325.0000 mg | ORAL_TABLET | Freq: Four times a day (QID) | ORAL | Status: DC | PRN

## 2022-11-27 MED ORDER — CEFAZOLIN SODIUM-DEXTROSE 2-4 GM/100ML-% IV SOLN
2.0000 g | Freq: Four times a day (QID) | INTRAVENOUS | Status: AC
  Administered 2022-11-27 (×2): 2 g via INTRAVENOUS
  Filled 2022-11-27 (×2): qty 100

## 2022-11-27 MED ORDER — PHENYLEPHRINE 80 MCG/ML (10ML) SYRINGE FOR IV PUSH (FOR BLOOD PRESSURE SUPPORT)
PREFILLED_SYRINGE | INTRAVENOUS | Status: DC | PRN
  Administered 2022-11-27 (×2): 160 ug via INTRAVENOUS
  Administered 2022-11-27: 80 ug via INTRAVENOUS
  Administered 2022-11-27 (×2): 160 ug via INTRAVENOUS

## 2022-11-27 MED ORDER — POVIDONE-IODINE 10 % EX SWAB: 2.0000 | Freq: Once | CUTANEOUS | Status: DC

## 2022-11-27 MED ORDER — PROPOFOL 1000 MG/100ML IV EMUL
INTRAVENOUS | Status: AC
  Filled 2022-11-27: qty 100

## 2022-11-27 MED ORDER — HYDROMORPHONE HCL 1 MG/ML IJ SOLN: 0.2500 mg | INTRAMUSCULAR | Status: DC | PRN

## 2022-11-27 MED ORDER — ONDANSETRON HCL 4 MG/2ML IJ SOLN
INTRAMUSCULAR | Status: AC
  Filled 2022-11-27: qty 2

## 2022-11-27 MED ORDER — TRANEXAMIC ACID-NACL 1000-0.7 MG/100ML-% IV SOLN
1000.0000 mg | INTRAVENOUS | Status: AC
  Administered 2022-11-27: 1000 mg via INTRAVENOUS
  Filled 2022-11-27: qty 100

## 2022-11-27 MED ORDER — PHENYLEPHRINE 80 MCG/ML (10ML) SYRINGE FOR IV PUSH (FOR BLOOD PRESSURE SUPPORT)
PREFILLED_SYRINGE | INTRAVENOUS | Status: AC
  Filled 2022-11-27: qty 10

## 2022-11-27 MED ORDER — OXYCODONE HCL 5 MG PO TABS
5.0000 mg | ORAL_TABLET | ORAL | Status: DC | PRN
  Administered 2022-11-27 – 2022-11-28 (×4): 10 mg via ORAL
  Filled 2022-11-27 (×5): qty 2

## 2022-11-27 MED ORDER — OXYCODONE HCL 5 MG/5ML PO SOLN: 5.0000 mg | Freq: Once | ORAL | Status: DC | PRN

## 2022-11-27 MED ORDER — CHLORHEXIDINE GLUCONATE 0.12 % MT SOLN
15.0000 mL | Freq: Once | OROMUCOSAL | Status: AC
  Administered 2022-11-27: 15 mL via OROMUCOSAL

## 2022-11-27 MED ORDER — KETOROLAC TROMETHAMINE 30 MG/ML IJ SOLN
INTRAMUSCULAR | Status: AC
  Filled 2022-11-27: qty 1

## 2022-11-27 MED ORDER — PROPOFOL 10 MG/ML IV BOLUS
INTRAVENOUS | Status: DC | PRN
  Administered 2022-11-27 (×2): 20 mg via INTRAVENOUS

## 2022-11-27 MED ORDER — PHENYLEPHRINE HCL-NACL 20-0.9 MG/250ML-% IV SOLN
INTRAVENOUS | Status: DC | PRN
  Administered 2022-11-27: 35 ug/min via INTRAVENOUS

## 2022-11-27 MED ORDER — ASPIRIN 81 MG PO CHEW
81.0000 mg | CHEWABLE_TABLET | Freq: Two times a day (BID) | ORAL | Status: DC
  Administered 2022-11-27 – 2022-11-28 (×2): 81 mg via ORAL
  Filled 2022-11-27 (×2): qty 1

## 2022-11-27 MED ORDER — ATORVASTATIN CALCIUM 40 MG PO TABS
80.0000 mg | ORAL_TABLET | Freq: Every day | ORAL | Status: DC
  Administered 2022-11-28: 80 mg via ORAL
  Filled 2022-11-27: qty 2

## 2022-11-27 MED ORDER — CEFAZOLIN IN SODIUM CHLORIDE 3-0.9 GM/100ML-% IV SOLN
3.0000 g | INTRAVENOUS | Status: AC
  Administered 2022-11-27: 3 g via INTRAVENOUS
  Filled 2022-11-27: qty 100

## 2022-11-27 MED ORDER — BUPIVACAINE HCL (PF) 0.25 % IJ SOLN
INTRAMUSCULAR | Status: AC
  Filled 2022-11-27: qty 30

## 2022-11-27 SURGICAL SUPPLY — 59 items
ADH SKN CLS APL DERMABOND .7 (GAUZE/BANDAGES/DRESSINGS) ×1
ATTUNE MED ANAT PAT 41 KNEE (Knees) IMPLANT
ATTUNE PS FEM RT SZ 6 CEM KNEE (Femur) IMPLANT
ATTUNE PSRP INSR SZ6 7 KNEE (Insert) IMPLANT
BAG COUNTER SPONGE SURGICOUNT (BAG) IMPLANT
BAG SPEC THK2 15X12 ZIP CLS (MISCELLANEOUS)
BAG SPNG CNTER NS LX DISP (BAG)
BAG ZIPLOCK 12X15 (MISCELLANEOUS) IMPLANT
BASE TIBIAL ROT PLAT SZ 8 KNEE (Knees) IMPLANT
BLADE SAW SGTL 11.0X1.19X90.0M (BLADE) IMPLANT
BLADE SAW SGTL 13.0X1.19X90.0M (BLADE) ×1 IMPLANT
BNDG CMPR 5X62 HK CLSR LF (GAUZE/BANDAGES/DRESSINGS) ×1
BNDG CMPR MED 10X6 ELC LF (GAUZE/BANDAGES/DRESSINGS) ×1
BNDG ELASTIC 6INX 5YD STR LF (GAUZE/BANDAGES/DRESSINGS) ×1 IMPLANT
BNDG ELASTIC 6X10 VLCR STRL LF (GAUZE/BANDAGES/DRESSINGS) IMPLANT
BOWL SMART MIX CTS (DISPOSABLE) ×1 IMPLANT
BSPLAT TIB 8 CMNT ROT PLAT STR (Knees) ×1 IMPLANT
CEMENT HV SMART SET (Cement) IMPLANT
COVER SURGICAL LIGHT HANDLE (MISCELLANEOUS) ×1 IMPLANT
CUFF TOURN SGL QUICK 34 (TOURNIQUET CUFF) ×1
CUFF TRNQT CYL 34X4.125X (TOURNIQUET CUFF) ×1 IMPLANT
DERMABOND ADVANCED .7 DNX12 (GAUZE/BANDAGES/DRESSINGS) ×1 IMPLANT
DRAPE U-SHAPE 47X51 STRL (DRAPES) ×1 IMPLANT
DRESSING AQUACEL AG SP 3.5X10 (GAUZE/BANDAGES/DRESSINGS) ×1 IMPLANT
DRSG AQUACEL AG ADV 3.5X10 (GAUZE/BANDAGES/DRESSINGS) IMPLANT
DRSG AQUACEL AG ADV 3.5X14 (GAUZE/BANDAGES/DRESSINGS) IMPLANT
DRSG AQUACEL AG SP 3.5X10 (GAUZE/BANDAGES/DRESSINGS)
DURAPREP 26ML APPLICATOR (WOUND CARE) ×2 IMPLANT
ELECT REM PT RETURN 15FT ADLT (MISCELLANEOUS) ×1 IMPLANT
GLOVE BIO SURGEON STRL SZ 6 (GLOVE) ×1 IMPLANT
GLOVE BIOGEL PI IND STRL 6.5 (GLOVE) ×1 IMPLANT
GLOVE BIOGEL PI IND STRL 7.5 (GLOVE) ×1 IMPLANT
GLOVE ORTHO TXT STRL SZ7.5 (GLOVE) ×2 IMPLANT
GOWN STRL REUS W/ TWL LRG LVL3 (GOWN DISPOSABLE) ×2 IMPLANT
GOWN STRL REUS W/TWL LRG LVL3 (GOWN DISPOSABLE) ×2
HANDPIECE INTERPULSE COAX TIP (DISPOSABLE) ×1
HOLDER FOLEY CATH W/STRAP (MISCELLANEOUS) IMPLANT
KIT TURNOVER KIT A (KITS) IMPLANT
MANIFOLD NEPTUNE II (INSTRUMENTS) ×1 IMPLANT
NDL SAFETY ECLIP 18X1.5 (MISCELLANEOUS) IMPLANT
NS IRRIG 1000ML POUR BTL (IV SOLUTION) ×1 IMPLANT
PACK TOTAL KNEE CUSTOM (KITS) ×1 IMPLANT
PIN FIX SIGMA LCS THRD HI (PIN) IMPLANT
PROTECTOR NERVE ULNAR (MISCELLANEOUS) ×1 IMPLANT
SET HNDPC FAN SPRY TIP SCT (DISPOSABLE) ×1 IMPLANT
SET PAD KNEE POSITIONER (MISCELLANEOUS) ×1 IMPLANT
SPIKE FLUID TRANSFER (MISCELLANEOUS) ×2 IMPLANT
SUT MNCRL AB 4-0 PS2 18 (SUTURE) ×1 IMPLANT
SUT STRATAFIX PDS+ 0 24IN (SUTURE) ×1 IMPLANT
SUT VIC AB 1 CT1 36 (SUTURE) ×1 IMPLANT
SUT VIC AB 2-0 CT1 27 (SUTURE) ×3
SUT VIC AB 2-0 CT1 TAPERPNT 27 (SUTURE) ×2 IMPLANT
SYR 3ML LL SCALE MARK (SYRINGE) ×1 IMPLANT
TIBIAL BASE ROT PLAT SZ 8 KNEE (Knees) ×1 IMPLANT
TOWEL GREEN STERILE FF (TOWEL DISPOSABLE) ×1 IMPLANT
TRAY FOLEY MTR SLVR 16FR STAT (SET/KITS/TRAYS/PACK) ×1 IMPLANT
TUBE SUCTION HIGH CAP CLEAR NV (SUCTIONS) ×1 IMPLANT
WATER STERILE IRR 1000ML POUR (IV SOLUTION) ×2 IMPLANT
WRAP KNEE MAXI GEL POST OP (GAUZE/BANDAGES/DRESSINGS) ×1 IMPLANT

## 2022-11-27 NOTE — Plan of Care (Signed)
  Problem: Education: Goal: Knowledge of the prescribed therapeutic regimen will improve Outcome: Progressing   Problem: Activity: Goal: Range of joint motion will improve Outcome: Progressing   Problem: Pain Management: Goal: Pain level will decrease with appropriate interventions Outcome: Progressing   Problem: Safety: Goal: Ability to remain free from injury will improve Outcome: Progressing   

## 2022-11-27 NOTE — Op Note (Signed)
NAME:  Gregory Wilson                      MEDICAL RECORD NO.:  [id number]                             FACILITY:  [facility]      PHYSICIAN:  Madlyn Frankel. Charlann Boxer, M.D.  DATE OF BIRTH:  1940/07/15      DATE OF PROCEDURE:  11/27/2022                                     OPERATIVE REPORT         PREOPERATIVE DIAGNOSIS:  Right knee osteoarthritis.      POSTOPERATIVE DIAGNOSIS:  Right knee osteoarthritis.      FINDINGS:  The patient was noted to have complete loss of cartilage and   bone-on-bone arthritis with associated osteophytes in all three compartments of   the knee with a significant synovitis and associated effusion.  The patient had failed months of conservative treatment including medications, injection therapy, activity modification.     PROCEDURE:  Right total knee replacement.      COMPONENTS USED:  DePuy Attune rotating platform posterior stabilized knee   system, a size 6 femur, 8 tibia, size 7 mm PS AOX insert, and 41 anatomic patellar   button.      SURGEON:  Madlyn Frankel. Charlann Boxer, M.D.      ASSISTANT:  Rosalene Billings, PA-C.      ANESTHESIA:  Regional and Spinal.      SPECIMENS:  None.      COMPLICATION:  None.      DRAINS:  None.  EBL: <200 cc      TOURNIQUET TIME:   Total Tourniquet Time Documented: Thigh (Right) - 46 minutes Total: Thigh (Right) - 46 minutes  .      The patient was stable to the recovery room.      INDICATION FOR PROCEDURE:  Gregory Wilson is a 82 y.o. male patient of   mine.  The patient had been seen, evaluated, and treated for months conservatively in the   office with medication, activity modification, and injections.  The patient had   radiographic changes of bone-on-bone arthritis with endplate sclerosis and osteophytes noted.  Based on the radiographic changes and failed conservative measures, the patient   decided to proceed with definitive treatment, total knee replacement.  Risks of infection, DVT, component failure, need for revision  surgery, neurovascular injury were reviewed in the office setting.  The postop course was reviewed stressing the efforts to maximize post-operative satisfaction and function.  Consent was obtained for benefit of pain   relief.      PROCEDURE IN DETAIL:  The patient was brought to the operative theater.   Once adequate anesthesia, preoperative antibiotics, 3 gm of Ancef,1 gm of Tranexamic Acid, and 10 mg of Decadron administered, the patient was positioned supine with a right thigh tourniquet placed.  The  right lower extremity was prepped and draped in sterile fashion.  A time-   out was performed identifying the patient, planned procedure, and the appropriate extremity.      The right lower extremity was placed in the [facility] leg holder.  The leg was   exsanguinated, tourniquet elevated to 225 mmHg.  A midline incision was   made followed  by median parapatellar arthrotomy.  Following initial   exposure, attention was first directed to the patella.  Precut   measurement was noted to be 27 mm.  I resected down to 15 mm and used a   41 anatomic patellar button to restore patellar height as well as cover the cut surface.      The lug holes were drilled and a metal shim was placed to protect the   patella from retractors and saw blade during the procedure.      At this point, attention was now directed to the femur.  The femoral   canal was opened with a drill, irrigated to try to prevent fat emboli.  An   intramedullary rod was passed at 5 degrees valgus, 10 mm of bone was   resected off the distal femur.  Following this resection, the tibia was   subluxated anteriorly.  Using the extramedullary guide, 2 mm of bone was resected off   the proximal lateral tibia.  We confirmed the gap would be   stable medially and laterally with a size 6 spacer block as well as confirmed that the tibial cut was perpendicular in the coronal plane, checking with an alignment rod.      Once this was done, I sized  the femur to be a size 6 in the anterior-   posterior dimension, chose a standard component based on medial and   lateral dimension.  The size 6 rotation block was then pinned in   position anterior referenced using the C-clamp to set rotation.  The   anterior, posterior, and  chamfer cuts were made without difficulty nor   notching making certain that I was along the anterior cortex to help   with flexion gap stability.      The final box cut was made off the lateral aspect of distal femur.      At this point, the tibia was sized to be a size 8.  The size 8 tray was   then pinned in position through the medial third of the tubercle,   drilled, and keel punched.  Trial reduction was now carried with a 6 femur,  8 tibia, a size 7 mm PS insert, and the 41 anatomic patella botton.  The knee was brought to full extension with good flexion stability with the patella   tracking through the trochlea without application of pressure.  Given   all these findings the trial components removed.  Final components were   opened and cement was mixed.  The knee was irrigated with normal saline solution and pulse lavage.  The synovial lining was   then injected with 30 cc of 0.25% Marcaine with epinephrine, 1 cc of Toradol and 30 cc of NS for a total of 61 cc.     Final implants were then cemented onto cleaned and dried cut surfaces of bone with the knee brought to extension with a size 7 mm PS trial insert.      Once the cement had fully cured, excess cement was removed   throughout the knee.  I confirmed that I was satisfied with the range of   motion and stability, and the final size 7 mm PS AOX insert was chosen.  It was   placed into the knee.      The tourniquet had been let down at 46 minutes.  No significant   hemostasis was required.  The extensor mechanism was then reapproximated using #1 Vicryl and #1  Stratafix sutures with the knee   in flexion.  The   remaining wound was closed with 2-0  Vicryl and running 4-0 Monocryl.   The knee was cleaned, dried, dressed sterilely using Dermabond and   Aquacel dressing.  The patient was then   brought to recovery room in stable condition, tolerating the procedure   well.   Please note that Physician Assistant, Rosalene Billings, PA-C was present for the entirety of the case, and was utilized for pre-operative positioning, peri-operative retractor management, general facilitation of the procedure and for primary wound closure at the end of the case.              Madlyn Frankel Charlann Boxer, M.D.    11/27/2022 11:49 AM

## 2022-11-27 NOTE — H&P (Signed)
TOTAL KNEE ADMISSION H&P  Patient is being admitted for right total knee arthroplasty.  Subjective:  Chief Complaint:right knee pain.  HPI: ABDOUL Wilson, 82 y.o. male, has a history of pain and functional disability in the right knee due to arthritis and has failed non-surgical conservative treatments for greater than 12 weeks to includeNSAID's and/or analgesics, corticosteriod injections, and activity modification.  Onset of symptoms was gradual, starting 2 years ago with gradually worsening course since that time. The patient noted no past surgery on the right knee(s).  Patient currently rates pain in the right knee(s) at 8 out of 10 with activity. Patient has worsening of pain with activity and weight bearing, pain that interferes with activities of daily living, and pain with passive range of motion.  Patient has evidence of joint space narrowing by imaging studies.  There is no active infection.  Patient Active Problem List   Diagnosis Date Noted   Nausea without vomiting 05/29/2021   History of colon polyps    RBBB 05/02/2018   Chest pain with moderate risk of acute coronary syndrome 05/02/2018   Unintentional weight loss 05/02/2018   Morbid obesity (HCC) 03/09/2017   Acute ST elevation myocardial infarction (STEMI) of anterior wall (HCC) 03/07/2017   STEMI involving left anterior descending coronary artery (HCC) 03/07/2017   OSA (obstructive sleep apnea) 03/26/2016   Bruit 11/24/2015   Hypertrophy of prostate with urinary obstruction and other lower urinary tract symptoms (LUTS) 12/21/2013   Personal history of colonic polyps 08/11/2013   Dysphagia, unspecified(787.20) 09/11/2012   Constipation 05/01/2011   ABDOMINAL PAIN 07/11/2009   DYSPEPSIA 11/12/2008   DYSPNEA 10/21/2008   Dyslipidemia, goal LDL below 70 10/20/2008   Essential hypertension 10/20/2008   CAD S/P percutaneous coronary angioplasty 10/20/2008   Atrial flutter (HCC) 10/20/2008   Gastroesophageal reflux  disease 10/20/2008   HIATAL HERNIA 10/20/2008   Spinal stenosis 10/20/2008   ARTHRITIS 10/20/2008   OTHER DYSPHAGIA 10/20/2008   BENIGN PROSTATIC HYPERTROPHY, HX OF 10/20/2008   Past Medical History:  Diagnosis Date   Acute ST elevation myocardial infarction (STEMI) of anterior wall (HCC) 03/07/2017   Arthritis    BPH (benign prostatic hypertrophy)    CAD (coronary artery disease)    a. Lexiscan Myoview (11/2013):  Normal stress nuclear study.  LV Ejection Fraction: 65%   Complication of anesthesia    hx PONV   Degenerative joint disease    GERD (gastroesophageal reflux disease)    H/O hiatal hernia    History of atrial flutter    REMOTE HX ON TREADMILL   History of colon polyps    Hyperlipidemia    Hypertension    IBS (irritable bowel syndrome)    Lower urinary tract symptoms (LUTS)    OSA (obstructive sleep apnea) 03/26/2016   RBBB (right bundle branch block)    S/P drug eluting coronary stent placement    11-26-2001   X2 DES  TO LAD &  BMS   Schatzki's ring    Status post dilation of esophageal narrowing     Past Surgical History:  Procedure Laterality Date   APPENDECTOMY     CARDIOVASCULAR STRESS TEST  12/09/2013   NUCLEAR LEXISCAN STUDY--  RESULTS PENDING   CARDIOVASCULAR STRESS TEST  04/2011  DR CRENSHAW   NORMAL /  NO ISCHEMIA/  EF 67%   CHOLECYSTECTOMY     COLONOSCOPY  05/29/2011   RMR: tubular adenoma   COLONOSCOPY WITH PROPOFOL N/A 05/23/2020   ;  Surgeon: Corbin Ade,  MD;  Nonbleeding internal hemorrhoids, diverticulosis in sigmoid and descending colon, 10 polyps removed.Pathology revealed tubular adenomas.  Recommended repeat colonoscopy in 3 years if health permits.   CORONARY ANGIOPLASTY WITH STENT PLACEMENT  11-26-2001  DR Charlies Constable   DES (Cypher) to LAD 70%/  BMS (Express) to OSTIAL D1 80%/   CFX  &  RCA normal/  EF 65%   CORONARY/GRAFT ACUTE MI REVASCULARIZATION N/A 03/07/2017   Procedure: Coronary/Graft Acute MI Revascularization;  Surgeon:  Tonny Bollman, MD;  Location: [facility] INVASIVE CV LAB;  Service: Cardiovascular;  Laterality: N/A;   ESOPHAGOGASTRODUODENOSCOPY  04/05/2004   RMR: Small hiatal hernia/Questionable subtle Schatzki's ring, status post dilation as described   ESOPHAGOGASTRODUODENOSCOPY  05/2011   RMR: small hiatal hernia, empiric dilation with 58F and 22F Maloney   ESOPHAGOGASTRODUODENOSCOPY (EGD) WITH ESOPHAGEAL DILATION N/A 01/16/2013   Dr. Rourk:baggy somewhat atonic esophagus-query underlying esophageal motility disorder. Status post passage of a Maloney dilator (large bore) empirically. Status post esophageal biopsy/Reflux symptoms are well controlled on Nexium.benign path   ESOPHAGOGASTRODUODENOSCOPY (EGD) WITH PROPOFOL N/A 05/23/2020   Surgeon: Corbin Ade, MD; Somewhat baggy esophageal body, otherwise normal esophagus s/p empiric dilation, small hiatal hernia, otherwise normal exam.   LEFT HEART CATH AND CORONARY ANGIOGRAPHY N/A 03/07/2017   Procedure: LEFT HEART CATH AND CORONARY ANGIOGRAPHY;  Surgeon: Tonny Bollman, MD;  Location: [facility] INVASIVE CV LAB;  Service: Cardiovascular;  Laterality: N/A;   MALONEY DILATION N/A 05/23/2020   Procedure: Elease Hashimoto DILATION;  Surgeon: Corbin Ade, MD;  Location: [facility] ENDO SUITE;  Service: Endoscopy;  Laterality: N/A;   POLYPECTOMY  05/23/2020   Procedure: POLYPECTOMY;  Surgeon: Corbin Ade, MD;  Location: [facility];  Service: Endoscopy;;   TOTAL HIP ARTHROPLASTY Left 09/07/2003   TOTAL KNEE ARTHROPLASTY Left 07/27/2002   TRANSURETHRAL RESECTION OF PROSTATE N/A 12/21/2013   Procedure: TRANSURETHRAL RESECTION OF THE PROSTATE WITH GYRUS INSTRUMENTS;  Surgeon: Chelsea Aus, MD;  Location: [facility];  Service: Urology;  Laterality: N/A;    No current facility-administered medications for this encounter.   Current Outpatient Medications  Medication Sig Dispense Refill Last Dose   amLODipine (NORVASC) 10 MG tablet TAKE 1 TABLET EVERY  MORNING 90 tablet 3    aspirin EC 81 MG tablet Take 81 mg by mouth daily. Swallow whole.      atorvastatin (LIPITOR) 80 MG tablet TAKE 1 TABLET EVERY DAY  AT  6PM (KEEP MD APPOINTMENT) 90 tablet 3    buprenorphine (BUTRANS) 10 MCG/HR PTWK Place 1 patch onto the skin every Sunday.      esomeprazole (NEXIUM) 20 MG capsule Take 20 mg by mouth daily as needed (acid reflux).      furosemide (LASIX) 20 MG tablet Take 20 mg by mouth daily as needed for edema.      irbesartan (AVAPRO) 300 MG tablet Take 1 tablet (300 mg total) by mouth daily. 90 tablet 3    methocarbamol (ROBAXIN) 750 MG tablet Take 1 tablet by mouth every 8 (eight) hours as needed for muscle spasms.      metoprolol succinate (TOPROL-XL) 100 MG 24 hr tablet TAKE 1 TABLET EVERY DAY 60 tablet 5    Allergies  Allergen Reactions   Codeine Nausea And Vomiting    Social History   Tobacco Use   Smoking status: Never   Smokeless tobacco: Never  Substance Use Topics   Alcohol use: Not Currently    Comment: on holidays    Family History  Problem  Relation Age of Onset   Cancer Father        Lung   Hypertension Mother    Coronary artery disease Mother    Kidney failure Mother    Cancer Sister        breast carcinoma   Hypertension Sister    Colon cancer Neg Hx      Review of Systems  Constitutional:  Negative for chills and fever.  Respiratory:  Negative for cough and shortness of breath.   Cardiovascular:  Negative for chest pain.  Gastrointestinal:  Negative for nausea and vomiting.  Musculoskeletal:  Positive for arthralgias.     Objective:  Physical Exam Well nourished and well developed. General: Alert and oriented x3, cooperative and pleasant, no acute distress. Head: normocephalic, atraumatic, neck supple. Eyes: EOMI.  Musculoskeletal: Right Knee: Pain with passive ROM Tender to palpation about bilateral joint lines  Calves soft and nontender. Motor function intact in LE. Strength 5/5 LE  bilaterally. Neuro: Distal pulses 2+. Sensation to light touch intact in LE.  Vital signs in last 24 hours:    Labs:   Estimated body mass index is 41.42 kg/m as calculated from the following:   Height as of 11/21/22: 5\' 11"  (1.803 m).   Weight as of 11/21/22: 134.7 kg.   Imaging Review Plain radiographs demonstrate severe degenerative joint disease of the right knee(s). The overall alignment isneutral. The bone quality appears to be adequate for age and reported activity level.      Assessment/Plan:  End stage arthritis, right knee   The patient history, physical examination, clinical judgment of the provider and imaging studies are consistent with end stage degenerative joint disease of the right knee(s) and total knee arthroplasty is deemed medically necessary. The treatment options including medical management, injection therapy arthroscopy and arthroplasty were discussed at length. The risks and benefits of total knee arthroplasty were presented and reviewed. The risks due to aseptic loosening, infection, stiffness, patella tracking problems, thromboembolic complications and other imponderables were discussed. The patient acknowledged the explanation, agreed to proceed with the plan and consent was signed. Patient is being admitted for inpatient treatment for surgery, pain control, PT, OT, prophylactic antibiotics, VTE prophylaxis, progressive ambulation and ADL's and discharge planning. The patient is planning to be discharged  home.  Therapy Plans: outpatient therapy at [facility] Disposition: Home with wife Planned DVT Prophylaxis: aspirin 81mg  BID DME needed: none PCP: Dr. Margo Aye, clearance received Cardio: Dr. [name], clearance received TXA: IV Allergies: none Anesthesia Concerns: none BMI: 42-  weight check on Friday after taking his lasix all week. ? possible fluid retention Last HgbA1c: 6.8%    Other: - buprenorphine patch -- did not get instructions from pain  provider - oxycodone, robaxin, tylenol - No hx of VTE or cancer - significant cervical disease -- neck does not extend well - awaiting cervical surgery    Patient's anticipated LOS is less than 2 midnights, meeting these requirements: - Younger than 16 - Lives within 1 hour of care - Has a competent adult at home to recover with post-op recover - NO history of  - Chronic pain requiring opiods  - Diabetes  - Coronary Artery Disease  - Heart failure  - Heart attack  - Stroke  - DVT/VTE  - Cardiac arrhythmia  - Respiratory Failure/COPD  - Renal failure  - Anemia  - Advanced Liver disease  Rosalene Billings, PA-C Orthopedic Surgery [facility] [place] [phone]

## 2022-11-27 NOTE — Interval H&P Note (Signed)
History and Physical Interval Note:  11/27/2022 8:40 AM  Gregory Wilson  has presented today for surgery, with the diagnosis of Right knee osteoarthritis.  The various methods of treatment have been discussed with the patient and family. After consideration of risks, benefits and other options for treatment, the patient has consented to  Procedure(s): TOTAL KNEE ARTHROPLASTY (Right) as a surgical intervention.  The patient's history has been reviewed, patient examined, no change in status, stable for surgery.  I have reviewed the patient's chart and labs.  Questions were answered to the patient's satisfaction.     Shelda Pal

## 2022-11-27 NOTE — Transfer of Care (Signed)
Immediate Anesthesia Transfer of Care Note  Patient: Gregory Wilson  Procedure(s) Performed: TOTAL KNEE ARTHROPLASTY (Right: Knee)  Patient Location: PACU  Anesthesia Type:Spinal  Level of Consciousness: awake, alert , and oriented  Airway & Oxygen Therapy: Patient Spontanous Breathing and Patient connected to face mask oxygen  Post-op Assessment: Report given to RN  Post vital signs: Reviewed and stable  Last Vitals:  Vitals Value Taken Time  BP 122/67 11/27/22 1215  Temp    Pulse 62 11/27/22 1216  Resp    SpO2 100 % 11/27/22 1216  Vitals shown include unvalidated device data.  Last Pain:  Vitals:   11/27/22 1000  TempSrc:   PainSc: 0-No pain         Complications: No notable events documented.

## 2022-11-27 NOTE — Anesthesia Procedure Notes (Signed)
Spinal  Patient location during procedure: OR Start time: 11/27/2022 10:12 AM End time: 11/27/2022 10:17 AM Reason for block: surgical anesthesia Staffing Performed: anesthesiologist  Anesthesiologist: Lowella Curb, MD Performed by: Lowella Curb, MD Authorized by: Lowella Curb, MD   Preanesthetic Checklist Completed: patient identified, IV checked, site marked, risks and benefits discussed, surgical consent, monitors and equipment checked, pre-op evaluation and timeout performed Spinal Block Patient position: sitting Prep: DuraPrep Patient monitoring: heart rate, cardiac monitor, continuous pulse ox and blood pressure Approach: midline Location: L3-4 Injection technique: single-shot Needle Needle type: Quincke  Needle gauge: 22 G Needle length: 9 cm Assessment Sensory level: T4 Events: CSF return and second provider Additional Notes First attempt by CRNA

## 2022-11-27 NOTE — Anesthesia Postprocedure Evaluation (Signed)
Anesthesia Post Note  Patient: Gregory Wilson  Procedure(s) Performed: TOTAL KNEE ARTHROPLASTY (Right: Knee)     Patient location during evaluation: PACU Anesthesia Type: MAC Level of consciousness: awake and alert Pain management: pain level controlled Vital Signs Assessment: post-procedure vital signs reviewed and stable Respiratory status: spontaneous breathing, nonlabored ventilation and respiratory function stable Cardiovascular status: blood pressure returned to baseline and stable Postop Assessment: no apparent nausea or vomiting Anesthetic complications: no   No notable events documented.  Last Vitals:  Vitals:   11/27/22 1300 11/27/22 1315  BP: 125/71 122/75  Pulse: (!) 55 63  Resp: 18 16  Temp:  36.4 C  SpO2: 99% 97%    Last Pain:  Vitals:   11/27/22 1315  TempSrc:   PainSc: 0-No pain                 Lowella Curb

## 2022-11-27 NOTE — Anesthesia Procedure Notes (Signed)
Anesthesia Regional Block: Adductor canal block   Pre-Anesthetic Checklist: , timeout performed,  Correct Patient, Correct Site, Correct Laterality,  Correct Procedure, Correct Position, site marked,  Risks and benefits discussed,  Surgical consent,  Pre-op evaluation,  At surgeon's request and post-op pain management  Laterality: Right  Prep: chloraprep       Needles:  Injection technique: Single-shot  Needle Type: Stimiplex     Needle Length: 9cm  Needle Gauge: 21     Additional Needles:   Procedures:,,,, ultrasound used (permanent image in chart),,    Narrative:  Start time: 11/27/2022 9:58 AM End time: 11/27/2022 10:03 AM Injection made incrementally with aspirations every 5 mL.  Performed by: Personally  Anesthesiologist: Lowella Curb, MD

## 2022-11-27 NOTE — Care Plan (Signed)
Ortho Bundle Case Management Note  Patient Details  Name: DEONDREA HARNISCH MRN: [id number] Date of Birth: 30-Aug-1940                  R TKA on 11/27/22.  DCP: Home with wife.  DME: No needs. Has RW.  PT: Kevan Ny 5/31   DME Arranged:  N/A DME Agency:       Additional Comments: Please contact me with any questions of if this plan should need to change.    Despina Pole, Case Manager  [facility]  [phone] 11/27/2022, 10:51 AM

## 2022-11-27 NOTE — Discharge Instructions (Signed)

## 2022-11-27 NOTE — Evaluation (Signed)
Physical Therapy Evaluation Patient Details Name: Gregory Wilson MRN: [id number] DOB: 1940/08/07 Today's Date: 11/27/2022  History of Present Illness  82 yo male presents to therapy s/p R TKA on 11/27/2022 due to failure of conservative measures. Pt has PMH including but not limited to: RBBB, angina, STEMI, OSA, dysphagia, HDL, HTN, dyspnea, A-fluter, GERD, CAD, hiatal hernia, spinal stenosis, BPH, cervical DJD and awaiting sx limited extension, L THA (2005), and  L TKA (2004).  Clinical Impression    Gregory Wilson is a 82 y.o. male POD 0 s/p R TKA. Patient reports mod I with mobility at baseline. Patient is now limited by functional impairments (see PT problem list below) and requires min guard for bed mobility and min guard from elevated EOB and mod A for STS from recliner for transfers. Patient was able to ambulate 45 feet with RW and min guard level of assist. Patient instructed in exercise to facilitate ROM and circulation to manage edema. Patient will benefit from continued skilled PT interventions to address impairments and progress towards PLOF. Acute PT will follow to progress mobility and stair training in preparation for safe discharge home with family support and states OPPT however would like HH PT and advised to discuss with Dr. Charlann Boxer.      Recommendations for follow up therapy are one component of a multi-disciplinary discharge planning process, led by the attending physician.  Recommendations may be updated based on patient status, additional functional criteria and insurance authorization.  Follow Up Recommendations       Assistance Recommended at Discharge Intermittent Supervision/Assistance  Patient can return home with the following  A little help with walking and/or transfers;A little help with bathing/dressing/bathroom;Assistance with cooking/housework;Assist for transportation;Help with stairs or ramp for entrance    Equipment Recommendations None recommended by PT (pt  reports DME in home setting)  Recommendations for Other Services       Functional Status Assessment Patient has had a recent decline in their functional status and demonstrates the ability to make significant improvements in function in a reasonable and predictable amount of time.     Precautions / Restrictions Precautions Precautions: Knee;Fall Restrictions Weight Bearing Restrictions: No      Mobility  Bed Mobility Overal bed mobility: Needs Assistance Bed Mobility: Supine to Sit     Supine to sit: Min guard     General bed mobility comments: HOB elevated and use of bed rails    Transfers Overall transfer level: Needs assistance Equipment used: Rolling walker (2 wheels) Transfers: Sit to/from Stand Sit to Stand: Min guard, From elevated surface (mod A from recliner)           General transfer comment: cues for proper UE and AD placement    Ambulation/Gait Ambulation/Gait assistance: Min guard Gait Distance (Feet): 45 Feet Assistive device: Rolling walker (2 wheels) Gait Pattern/deviations: Step-to pattern, Trunk flexed, Antalgic (head forward)          Stairs            Wheelchair Mobility    Modified Rankin (Stroke Patients Only)       Balance Overall balance assessment: Needs assistance Sitting-balance support: Feet supported Sitting balance-Leahy Scale: Good     Standing balance support: Bilateral upper extremity supported, During functional activity, Reliant on assistive device for balance Standing balance-Leahy Scale: Poor  Pertinent Vitals/Pain Pain Assessment Pain Assessment: 0-10 Pain Score: 7  (knee pain increased from 4/10 to 7/10 with mobility tasks) Pain Location: Back, neck and R knee Pain Descriptors / Indicators: Constant, Discomfort, Operative site guarding Pain Intervention(s): Limited activity within patient's tolerance, Monitored during session, Premedicated before session,  Repositioned, Ice applied    Home Living Family/patient expects to be discharged to:: Private residence Living Arrangements: Spouse/significant other;Children Available Help at Discharge: Family Type of Home: House Home Access: Stairs to enter Entrance Stairs-Rails: None Entrance Stairs-Number of Steps: 1   Home Layout: One level Home Equipment: Agricultural consultant (2 wheels);Cane - single point      Prior Function Prior Level of Function : Independent/Modified Independent             Mobility Comments: mod I with RW due to progression of R knee pain and back pain mod I with ADLs, self care tasks pt reqiured some assist for car transfers and family assist for IADLs       Hand Dominance        Extremity/Trunk Assessment        Lower Extremity Assessment Lower Extremity Assessment: RLE deficits/detail RLE Deficits / Details: ankle DF/PF; AA SLR RLE Sensation: WNL    Cervical / Trunk Assessment Cervical / Trunk Assessment: Other exceptions (limited cervical extension)  Communication   Communication: No difficulties  Cognition Arousal/Alertness: Awake/alert Behavior During Therapy: WFL for tasks assessed/performed Overall Cognitive Status: Within Functional Limits for tasks assessed                                          General Comments      Exercises Total Joint Exercises Ankle Circles/Pumps: AROM, Both, 20 reps   Assessment/Plan    PT Assessment Patient needs continued PT services  PT Problem List Decreased strength;Decreased range of motion;Decreased activity tolerance;Decreased balance;Decreased mobility;Pain       PT Treatment Interventions DME instruction;Gait training;Stair training;Functional mobility training;Therapeutic activities;Therapeutic exercise;Balance training;Neuromuscular re-education;Patient/family education;Modalities    PT Goals (Current goals can be found in the Care Plan section)  Acute Rehab PT Goals Patient  Stated Goal: to be able to walk normally and proceede with cervical sx maybe July PT Goal Formulation: With patient Time For Goal Achievement: 12/11/22 Potential to Achieve Goals: Good    Frequency 7X/week     Co-evaluation               AM-PAC PT "6 Clicks" Mobility  Outcome Measure Help needed turning from your back to your side while in a flat bed without using bedrails?: A Little Help needed moving from lying on your back to sitting on the side of a flat bed without using bedrails?: A Little Help needed moving to and from a bed to a chair (including a wheelchair)?: A Little Help needed standing up from a chair using your arms (e.g., wheelchair or bedside chair)?: A Little Help needed to walk in hospital room?: A Little Help needed climbing 3-5 steps with a railing? : A Lot 6 Click Score: 17    End of Session Equipment Utilized During Treatment: Gait belt Activity Tolerance: Patient limited by pain;Patient limited by fatigue Patient left: in chair;with call bell/phone within reach Nurse Communication: Mobility status;Patient requests pain meds PT Visit Diagnosis: Unsteadiness on feet (R26.81);Other abnormalities of gait and mobility (R26.89);Muscle weakness (generalized) (M62.81);Pain Pain - Right/Left: Right Pain - part  of body: Knee;Leg    Time: 1610-9604 PT Time Calculation (min) (ACUTE ONLY): 43 min   Charges:   PT Evaluation $PT Eval Low Complexity: 1 Low PT Treatments $Gait Training: 8-22 mins $Therapeutic Activity: 8-22 mins        Johnny Bridge, PT Acute Rehab   Jacqualyn Posey 11/27/2022, 4:49 PM

## 2022-11-28 ENCOUNTER — Encounter (HOSPITAL_COMMUNITY): Payer: Self-pay | Admitting: Orthopedic Surgery

## 2022-11-28 DIAGNOSIS — Z96642 Presence of left artificial hip joint: Secondary | ICD-10-CM | POA: Diagnosis not present

## 2022-11-28 DIAGNOSIS — Z7982 Long term (current) use of aspirin: Secondary | ICD-10-CM | POA: Diagnosis not present

## 2022-11-28 DIAGNOSIS — I2510 Atherosclerotic heart disease of native coronary artery without angina pectoris: Secondary | ICD-10-CM | POA: Diagnosis not present

## 2022-11-28 DIAGNOSIS — Z96652 Presence of left artificial knee joint: Secondary | ICD-10-CM | POA: Diagnosis not present

## 2022-11-28 DIAGNOSIS — Z9861 Coronary angioplasty status: Secondary | ICD-10-CM | POA: Diagnosis not present

## 2022-11-28 DIAGNOSIS — M1711 Unilateral primary osteoarthritis, right knee: Secondary | ICD-10-CM | POA: Diagnosis not present

## 2022-11-28 DIAGNOSIS — I10 Essential (primary) hypertension: Secondary | ICD-10-CM | POA: Diagnosis not present

## 2022-11-28 DIAGNOSIS — Z79899 Other long term (current) drug therapy: Secondary | ICD-10-CM | POA: Diagnosis not present

## 2022-11-28 LAB — CBC
HCT: 31.7 % — ABNORMAL LOW (ref 39.0–52.0)
Hemoglobin: 9.9 g/dL — ABNORMAL LOW (ref 13.0–17.0)
MCH: 24.9 pg — ABNORMAL LOW (ref 26.0–34.0)
MCHC: 31.2 g/dL (ref 30.0–36.0)
MCV: 79.8 fL — ABNORMAL LOW (ref 80.0–100.0)
Platelets: 171 10*3/uL (ref 150–400)
RBC: 3.97 MIL/uL — ABNORMAL LOW (ref 4.22–5.81)
RDW: 19.3 % — ABNORMAL HIGH (ref 11.5–15.5)
WBC: 8.1 10*3/uL (ref 4.0–10.5)
nRBC: 0 % (ref 0.0–0.2)

## 2022-11-28 LAB — BASIC METABOLIC PANEL
Anion gap: 8 (ref 5–15)
BUN: 19 mg/dL (ref 8–23)
CO2: 20 mmol/L — ABNORMAL LOW (ref 22–32)
Calcium: 8 mg/dL — ABNORMAL LOW (ref 8.9–10.3)
Chloride: 108 mmol/L (ref 98–111)
Creatinine, Ser: 1.23 mg/dL (ref 0.61–1.24)
GFR, Estimated: 59 mL/min — ABNORMAL LOW (ref >60)
Glucose, Bld: 171 mg/dL — ABNORMAL HIGH (ref 70–99)
Potassium: 4.5 mmol/L (ref 3.5–5.1)
Sodium: 136 mmol/L (ref 135–145)

## 2022-11-28 MED ORDER — ASPIRIN 81 MG PO CHEW: 81.0000 mg | CHEWABLE_TABLET | Freq: Two times a day (BID) | ORAL | 0 refills | Status: AC

## 2022-11-28 MED ORDER — POLYETHYLENE GLYCOL 3350 17 G PO PACK: 17.0000 g | PACK | Freq: Two times a day (BID) | ORAL | 0 refills | Status: AC

## 2022-11-28 MED ORDER — METHOCARBAMOL 500 MG PO TABS: 500.0000 mg | ORAL_TABLET | Freq: Four times a day (QID) | ORAL | 2 refills | Status: DC | PRN

## 2022-11-28 MED ORDER — SENNA 8.6 MG PO TABS: 2.0000 | ORAL_TABLET | Freq: Every day | ORAL | 0 refills | Status: AC

## 2022-11-28 NOTE — TOC Transition Note (Signed)
Transition of Care [facility]) - CM/SW Discharge Note   Patient Details  Name: Gregory Wilson MRN: [id number] Date of Birth: 06-Mar-1941  [facility] CM/SW Contact:  Amada Jupiter, LCSW Phone Number: 11/28/2022, 10:08 AM   Clinical Narrative:     Met with pt and confirming he has needed DME at home.  Per pt and PA note,  and after discussion with Ortho Bundle CM, pt will begin with HHPT and transition to OP in 1-2 weeks.  Ortho CM referring to [facility] for HHPT coverage.  Pt aware.  No further TOC needs.  Final next level of care: Home w Home Health Services Barriers to Discharge: No Barriers Identified   Patient Goals and CMS Choice      Discharge Placement                         Discharge Plan and Services Additional resources added to the After Visit Summary for                  DME Arranged: N/A         HH Arranged: PT HH Agency: [facility]        Social Determinants of Health (SDOH) Interventions SDOH Screenings   Food Insecurity: No Food Insecurity (11/27/2022)  Housing: Low Risk  (11/27/2022)  Transportation Needs: No Transportation Needs (11/27/2022)  Utilities: Not At Risk (11/27/2022)  Tobacco Use: Low Risk  (11/27/2022)     Readmission Risk Interventions     No data to display

## 2022-11-28 NOTE — Progress Notes (Signed)
Physical Therapy Treatment Patient Details Name: Gregory Wilson MRN: [id number] DOB: [birth year] Today's Date: 11/28/2022   History of Present Illness 82 yo male presents to therapy s/p R TKA on 11/27/2022 due to failure of conservative measures. Pt has PMH including but not limited to: RBBB, angina, STEMI, OSA, dysphagia, HDL, HTN, dyspnea, A-fluter, GERD, CAD, hiatal hernia, spinal stenosis, BPH, cervical DJD and awaiting sx limited extension, L THA (2005), and  L TKA (2004).    PT Comments    POD # 1 am session Assisted OOB to amb in hallway. Then returned to room to perform some TE's following HEP handout.  Instructed on proper tech, freq as well as use of ICE.   Pt will need another session with Spouse this afternoon.    Recommendations for follow up therapy are one component of a multi-disciplinary discharge planning process, led by the attending physician.  Recommendations may be updated based on patient status, additional functional criteria and insurance authorization.  Follow Up Recommendations       Assistance Recommended at Discharge Intermittent Supervision/Assistance  Patient can return home with the following A little help with walking and/or transfers;A little help with bathing/dressing/bathroom;Assistance with cooking/housework;Assist for transportation;Help with stairs or ramp for entrance   Equipment Recommendations  None recommended by PT    Recommendations for Other Services       Precautions / Restrictions Precautions Precautions: Knee;Fall Precaution Comments: no pillow under knee Restrictions Weight Bearing Restrictions: No Other Position/Activity Restrictions: WBAT     Mobility  Bed Mobility Overal bed mobility: Needs Assistance Bed Mobility: Supine to Sit     Supine to sit: Supervision, Min guard     General bed mobility comments: demonstarted and instructed how to use belt to self assist LE    Transfers Overall transfer level: Needs  assistance Equipment used: Rolling walker (2 wheels) Transfers: Sit to/from Stand Sit to Stand: Min guard, From elevated surface           General transfer comment: cues for proper UE and AD placement    Ambulation/Gait Ambulation/Gait assistance: Min guard Gait Distance (Feet): 55 Feet Assistive device: Rolling walker (2 wheels) Gait Pattern/deviations: Step-to pattern, Trunk flexed, Antalgic Gait velocity: decreased     General Gait Details: 25% VC's on safety with turns and proper walker to self distance   Stairs             Wheelchair Mobility    Modified Rankin (Stroke Patients Only)       Balance                                            Cognition Arousal/Alertness: Awake/alert Behavior During Therapy: WFL for tasks assessed/performed Overall Cognitive Status: Within Functional Limits for tasks assessed                                 General Comments: AxO x 3 very pleasant        Exercises  Total Knee Replacement TE's following HEP handout 10 reps B LE ankle pumps 05 reps towel squeezes 05 reps knee presses 05 reps heel slides  05 reps SAQ's 05 reps SLR's 05 reps ABD Educated on use of gait belt to assist with TE's Followed by ICE     General Comments  Pertinent Vitals/Pain Pain Assessment Pain Assessment: 0-10 Pain Score: 5  Pain Location: R knee Pain Descriptors / Indicators: Constant, Discomfort, Operative site guarding Pain Intervention(s): Monitored during session, Premedicated before session, Repositioned, Ice applied    Home Living                          Prior Function            PT Goals (current goals can now be found in the care plan section) Progress towards PT goals: Progressing toward goals    Frequency    7X/week      PT Plan Current plan remains appropriate    Co-evaluation              AM-PAC PT "6 Clicks" Mobility   Outcome Measure   Help needed turning from your back to your side while in a flat bed without using bedrails?: A Little Help needed moving from lying on your back to sitting on the side of a flat bed without using bedrails?: A Little Help needed moving to and from a bed to a chair (including a wheelchair)?: A Little Help needed standing up from a chair using your arms (e.g., wheelchair or bedside chair)?: A Little Help needed to walk in hospital room?: A Little Help needed climbing 3-5 steps with a railing? : A Little 6 Click Score: 18    End of Session Equipment Utilized During Treatment: Gait belt Activity Tolerance: Patient tolerated treatment well Patient left: in chair;with call bell/phone within reach Nurse Communication: Mobility status;Patient requests pain meds PT Visit Diagnosis: Unsteadiness on feet (R26.81);Other abnormalities of gait and mobility (R26.89);Muscle weakness (generalized) (M62.81);Pain Pain - Right/Left: Right Pain - part of body: Knee;Leg     Time: 1610-9604 PT Time Calculation (min) (ACUTE ONLY): 26 min  Charges:  $Gait Training: 8-22 mins $Therapeutic Exercise: 8-22 mins                     Felecia Shelling  PTA Acute  Rehabilitation Services Office M-F          [phone]

## 2022-11-28 NOTE — Progress Notes (Signed)
Physical Therapy Treatment Patient Details Name: Gregory Wilson MRN: [id number] DOB: 11-26-1940 Today's Date: 11/28/2022   History of Present Illness 82 yo male presents to therapy s/p R TKA on 11/27/2022 due to failure of conservative measures. Pt has PMH including but not limited to: RBBB, angina, STEMI, OSA, dysphagia, HDL, HTN, dyspnea, A-fluter, GERD, CAD, hiatal hernia, spinal stenosis, BPH, cervical DJD and awaiting sx limited extension, L THA (2005), and  L TKA (2004).    PT Comments    POD # 1 pm session Spouse present during session for Family Education on all mobility, stairs and HEP. Had Spouse "hands on" assist pt with amb as well as ONE step BACKWARD approach to enter  home.  Then returned to room to perform some TE's following HEP handout.  Instructed on proper tech, freq as well as use of ICE.   Addressed all mobility questions, discussed appropriate activity, educated on use of ICE.  Pt ready for D/C to home.   Recommendations for follow up therapy are one component of a multi-disciplinary discharge planning process, led by the attending physician.  Recommendations may be updated based on patient status, additional functional criteria and insurance authorization.  Follow Up Recommendations       Assistance Recommended at Discharge Intermittent Supervision/Assistance  Patient can return home with the following A little help with walking and/or transfers;A little help with bathing/dressing/bathroom;Assistance with cooking/housework;Assist for transportation;Help with stairs or ramp for entrance   Equipment Recommendations  None recommended by PT    Recommendations for Other Services       Precautions / Restrictions Precautions Precautions: Knee;Fall Precaution Comments: no pillow under knee Restrictions Weight Bearing Restrictions: No Other Position/Activity Restrictions: WBAT     Mobility  Bed Mobility   General bed mobility comments: OOB in recliner     Transfers Overall transfer level: Needs assistance Equipment used: Rolling walker (2 wheels) Transfers: Sit to/from Stand Sit to Stand: Supervision           General transfer comment: cues for proper UE and AD placement    Ambulation/Gait Ambulation/Gait assistance: Supervision Gait Distance (Feet): 45 Feet Assistive device: Rolling walker (2 wheels) Gait Pattern/deviations: Step-to pattern, Trunk flexed, Antalgic Gait velocity: decreased     General Gait Details: had Spouse "hands on" assist using safety belt   Stairs Stairs: Yes Stairs assistance: Supervision, Min guard Stair Management: No rails, Step to pattern, Backwards Number of Stairs: 1 General stair comments: with Spouse present,  up backward ONE step   Wheelchair Mobility    Modified Rankin (Stroke Patients Only)       Balance                                            Cognition Arousal/Alertness: Awake/alert Behavior During Therapy: WFL for tasks assessed/performed Overall Cognitive Status: Within Functional Limits for tasks assessed                                 General Comments: AxO x 3 very pleasant        Exercises  Total Knee Replacement TE's following HEP handout 10 reps B LE ankle pumps 05 reps towel squeezes 05 reps knee presses 05 reps heel slides  05 reps SAQ's 05 reps SLR's 05 reps ABD Educated on use of gait belt to  assist with TE's Followed by ICE     General Comments        Pertinent Vitals/Pain Pain Assessment Pain Assessment: 0-10 Pain Score: 5  Pain Location: R knee Pain Descriptors / Indicators: Constant, Discomfort, Operative site guarding Pain Intervention(s): Monitored during session, Premedicated before session, Repositioned, Ice applied    Home Living                          Prior Function            PT Goals (current goals can now be found in the care plan section) Progress towards PT goals:  Progressing toward goals    Frequency    7X/week      PT Plan Current plan remains appropriate    Co-evaluation              AM-PAC PT "6 Clicks" Mobility   Outcome Measure  Help needed turning from your back to your side while in a flat bed without using bedrails?: A Little Help needed moving from lying on your back to sitting on the side of a flat bed without using bedrails?: A Little Help needed moving to and from a bed to a chair (including a wheelchair)?: A Little Help needed standing up from a chair using your arms (e.g., wheelchair or bedside chair)?: A Little Help needed to walk in hospital room?: A Little Help needed climbing 3-5 steps with a railing? : A Little 6 Click Score: 18    End of Session Equipment Utilized During Treatment: Gait belt Activity Tolerance: Patient tolerated treatment well Patient left: in chair;with call bell/phone within reach Nurse Communication: Mobility status;Patient requests pain meds PT Visit Diagnosis: Unsteadiness on feet (R26.81);Other abnormalities of gait and mobility (R26.89);Muscle weakness (generalized) (M62.81);Pain Pain - Right/Left: Right Pain - part of body: Knee;Leg     Time: 1440-1510 PT Time Calculation (min) (ACUTE ONLY): 30 min  Charges:  $Gait Training: 8-22 mins $Therapeutic Exercise: 8-22 mins                     Felecia Shelling  PTA Acute  Rehabilitation Services Office M-F          [phone]

## 2022-11-28 NOTE — Plan of Care (Signed)

## 2022-11-28 NOTE — Progress Notes (Addendum)
Subjective: 1 Day Post-Op Procedure(s) (LRB): TOTAL KNEE ARTHROPLASTY (Right) Patient reports pain as mild.   Patient seen in rounds with Dr. Charlann Boxer. Patient is well, and has had no acute complaints or problems. No acute events overnight. Foley catheter removed. Patient ambulated 45 feet with PT. He is concerned about whether he and his wife can get him in and out of the car for PT.  We will start therapy today.   Objective: Vital signs in last 24 hours: Temp:  [97.1 F (36.2 C)-98.1 F (36.7 C)] 98 F (36.7 C) (05/29 0553) Pulse Rate:  [55-78] 78 (05/29 0553) Resp:  [15-18] 15 (05/29 0553) BP: (118-177)/(67-96) 167/76 (05/29 0553) SpO2:  [97 %-100 %] 100 % (05/29 0553) Weight:  [134.7 kg] 134.7 kg (05/28 0808)  Intake/Output from previous day:  Intake/Output Summary (Last 24 hours) at 11/28/2022 0735 Last data filed at 11/28/2022 0600 Gross per 24 hour  Intake 3497.93 ml  Output 1850 ml  Net 1647.93 ml     Intake/Output this shift: No intake/output data recorded.  Labs: Recent Labs    11/28/22 0332  HGB 9.9*   Recent Labs    11/28/22 0332  WBC 8.1  RBC 3.97*  HCT 31.7*  PLT 171   Recent Labs    11/28/22 0332  NA 136  K 4.5  CL 108  CO2 20*  BUN 19  CREATININE 1.23  GLUCOSE 171*  CALCIUM 8.0*   No results for input(s): "LABPT", "INR" in the last 72 hours.  Exam: General - Patient is Alert and Oriented Extremity - Neurologically intact Sensation intact distally Intact pulses distally Dorsiflexion/Plantar flexion intact Dressing - dressing C/D/I Motor Function - intact, moving foot and toes well on exam.   Past Medical History:  Diagnosis Date   Acute ST elevation myocardial infarction (STEMI) of anterior wall (HCC) 03/07/2017   Arthritis    BPH (benign prostatic hypertrophy)    CAD (coronary artery disease)    a. Lexiscan Myoview (11/2013):  Normal stress nuclear study.  LV Ejection Fraction: 65%   Complication of anesthesia    hx PONV    Degenerative joint disease    GERD (gastroesophageal reflux disease)    H/O hiatal hernia    History of atrial flutter    REMOTE HX ON TREADMILL   History of colon polyps    Hyperlipidemia    Hypertension    IBS (irritable bowel syndrome)    Lower urinary tract symptoms (LUTS)    OSA (obstructive sleep apnea) 03/26/2016   RBBB (right bundle branch block)    S/P drug eluting coronary stent placement    11-26-2001   X2 DES  TO LAD &  BMS   Schatzki's ring    Status post dilation of esophageal narrowing     Assessment/Plan: 1 Day Post-Op Procedure(s) (LRB): TOTAL KNEE ARTHROPLASTY (Right) Principal Problem:   S/P total knee arthroplasty, right  Estimated body mass index is 41.42 kg/m as calculated from the following:   Height as of this encounter: 5\' 11"  (1.803 m).   Weight as of this encounter: 134.7 kg. Advance diet Up with therapy D/C IV fluids   Patient's anticipated LOS is less than 2 midnights, meeting these requirements: - Younger than 34 - Lives within 1 hour of care - Has a competent adult at home to recover with post-op recover - NO history of  - Chronic pain requiring opiods  - Diabetes  - Coronary Artery Disease  - Heart failure  - Heart  attack  - Stroke  - DVT/VTE  - Cardiac arrhythmia  - Respiratory Failure/COPD  - Renal failure  - Anemia  - Advanced Liver disease     DVT Prophylaxis - Aspirin Weight bearing as tolerated.  Hgb stable at 9.9 this AM. Based on BMI >40, will give 1 week of abx.   Plan is to go Home after hospital stay. Plan for discharge today following 1-2 sessions of PT as long as they are meeting their goals.   Will get HHPT for the first 1-2 weeks as he practices getting in/out of car and mobilizes. We discussed that based on his baseline stiffness, it will be very important to transition to OPPT as soon as he can.   Follow up in the office in 2 weeks.   His pain provider has already sent his post op pain rx for him.    Dennie Bible, PA-C Orthopedic Surgery [phone] 11/28/2022, 7:35 AM

## 2022-11-30 DIAGNOSIS — N138 Other obstructive and reflux uropathy: Secondary | ICD-10-CM | POA: Diagnosis not present

## 2022-11-30 DIAGNOSIS — I4892 Unspecified atrial flutter: Secondary | ICD-10-CM | POA: Diagnosis not present

## 2022-11-30 DIAGNOSIS — Z471 Aftercare following joint replacement surgery: Secondary | ICD-10-CM | POA: Diagnosis not present

## 2022-11-30 DIAGNOSIS — M810 Age-related osteoporosis without current pathological fracture: Secondary | ICD-10-CM | POA: Diagnosis not present

## 2022-11-30 DIAGNOSIS — Z96651 Presence of right artificial knee joint: Secondary | ICD-10-CM | POA: Diagnosis not present

## 2022-11-30 DIAGNOSIS — N401 Enlarged prostate with lower urinary tract symptoms: Secondary | ICD-10-CM | POA: Diagnosis not present

## 2022-11-30 DIAGNOSIS — I119 Hypertensive heart disease without heart failure: Secondary | ICD-10-CM | POA: Diagnosis not present

## 2022-11-30 DIAGNOSIS — I2510 Atherosclerotic heart disease of native coronary artery without angina pectoris: Secondary | ICD-10-CM | POA: Diagnosis not present

## 2022-11-30 DIAGNOSIS — K589 Irritable bowel syndrome without diarrhea: Secondary | ICD-10-CM | POA: Diagnosis not present

## 2022-12-02 DIAGNOSIS — N138 Other obstructive and reflux uropathy: Secondary | ICD-10-CM | POA: Diagnosis not present

## 2022-12-02 DIAGNOSIS — K589 Irritable bowel syndrome without diarrhea: Secondary | ICD-10-CM | POA: Diagnosis not present

## 2022-12-02 DIAGNOSIS — M810 Age-related osteoporosis without current pathological fracture: Secondary | ICD-10-CM | POA: Diagnosis not present

## 2022-12-02 DIAGNOSIS — I4892 Unspecified atrial flutter: Secondary | ICD-10-CM | POA: Diagnosis not present

## 2022-12-02 DIAGNOSIS — Z471 Aftercare following joint replacement surgery: Secondary | ICD-10-CM | POA: Diagnosis not present

## 2022-12-02 DIAGNOSIS — I2510 Atherosclerotic heart disease of native coronary artery without angina pectoris: Secondary | ICD-10-CM | POA: Diagnosis not present

## 2022-12-02 DIAGNOSIS — N401 Enlarged prostate with lower urinary tract symptoms: Secondary | ICD-10-CM | POA: Diagnosis not present

## 2022-12-02 DIAGNOSIS — Z96651 Presence of right artificial knee joint: Secondary | ICD-10-CM | POA: Diagnosis not present

## 2022-12-02 DIAGNOSIS — I119 Hypertensive heart disease without heart failure: Secondary | ICD-10-CM | POA: Diagnosis not present

## 2022-12-03 DIAGNOSIS — K589 Irritable bowel syndrome without diarrhea: Secondary | ICD-10-CM | POA: Diagnosis not present

## 2022-12-03 DIAGNOSIS — Z96651 Presence of right artificial knee joint: Secondary | ICD-10-CM | POA: Diagnosis not present

## 2022-12-03 DIAGNOSIS — N138 Other obstructive and reflux uropathy: Secondary | ICD-10-CM | POA: Diagnosis not present

## 2022-12-03 DIAGNOSIS — I2510 Atherosclerotic heart disease of native coronary artery without angina pectoris: Secondary | ICD-10-CM | POA: Diagnosis not present

## 2022-12-03 DIAGNOSIS — M810 Age-related osteoporosis without current pathological fracture: Secondary | ICD-10-CM | POA: Diagnosis not present

## 2022-12-03 DIAGNOSIS — N401 Enlarged prostate with lower urinary tract symptoms: Secondary | ICD-10-CM | POA: Diagnosis not present

## 2022-12-03 DIAGNOSIS — I119 Hypertensive heart disease without heart failure: Secondary | ICD-10-CM | POA: Diagnosis not present

## 2022-12-03 DIAGNOSIS — Z471 Aftercare following joint replacement surgery: Secondary | ICD-10-CM | POA: Diagnosis not present

## 2022-12-03 DIAGNOSIS — I4892 Unspecified atrial flutter: Secondary | ICD-10-CM | POA: Diagnosis not present

## 2022-12-03 NOTE — Discharge Summary (Signed)
Patient ID: RAYMOND KALLAY MRN: [id number] DOB/AGE: 02-25-1941 82 y.o.  Admit date: 11/27/2022 Discharge date: 11/28/2022  Admission Diagnoses:  Right knee osteoarthritis  Discharge Diagnoses:  Principal Problem:   S/P total knee arthroplasty, right   Past Medical History:  Diagnosis Date   Acute ST elevation myocardial infarction (STEMI) of anterior wall (HCC) 03/07/2017   Arthritis    BPH (benign prostatic hypertrophy)    CAD (coronary artery disease)    a. Lexiscan Myoview (11/2013):  Normal stress nuclear study.  LV Ejection Fraction: 65%   Complication of anesthesia    hx PONV   Degenerative joint disease    GERD (gastroesophageal reflux disease)    H/O hiatal hernia    History of atrial flutter    REMOTE HX ON TREADMILL   History of colon polyps    Hyperlipidemia    Hypertension    IBS (irritable bowel syndrome)    Lower urinary tract symptoms (LUTS)    OSA (obstructive sleep apnea) 03/26/2016   RBBB (right bundle branch block)    S/P drug eluting coronary stent placement    11-26-2001   X2 DES  TO LAD &  BMS   Schatzki's ring    Status post dilation of esophageal narrowing     Surgeries: Procedure(s): TOTAL KNEE ARTHROPLASTY on 11/27/2022   Consultants:   Discharged Condition: Improved  Hospital Course: DIRRICK LAX is an 82 y.o. male who was admitted 11/27/2022 for operative treatment ofS/P total knee arthroplasty, right. Patient has severe unremitting pain that affects sleep, daily activities, and work/hobbies. After pre-op clearance the patient was taken to the operating room on 11/27/2022 and underwent  Procedure(s): TOTAL KNEE ARTHROPLASTY.    Patient was given perioperative antibiotics:  Anti-infectives (From admission, onward)    Start     Dose/Rate Route Frequency Ordered Stop   11/27/22 1600  ceFAZolin (ANCEF) IVPB 2g/100 mL premix        2 g 200 mL/hr over 30 Minutes Intravenous Every 6 hours 11/27/22 1342 11/27/22 2144   11/27/22 0900   ceFAZolin (ANCEF) IVPB 3g/100 mL premix        3 g 200 mL/hr over 30 Minutes Intravenous On call to O.R. 11/27/22 0801 11/27/22 1017        Patient was given sequential compression devices, early ambulation, and chemoprophylaxis to prevent DVT. Patient worked with PT and was meeting their goals regarding safe ambulation and transfers.  Patient benefited maximally from hospital stay and there were no complications.    Recent vital signs: No data found.   Recent laboratory studies: No results for input(s): "WBC", "HGB", "HCT", "PLT", "NA", "K", "CL", "CO2", "BUN", "CREATININE", "GLUCOSE", "INR", "CALCIUM" in the last 72 hours.  Invalid input(s): "PT", "2"   Discharge Medications:   Allergies as of 11/28/2022       Reactions   Codeine Nausea And Vomiting        Medication List     STOP taking these medications    aspirin EC 81 MG tablet Replaced by: aspirin 81 MG chewable tablet   buprenorphine 10 MCG/HR Ptwk Commonly known as: BUTRANS       TAKE these medications    amLODipine 10 MG tablet Commonly known as: NORVASC TAKE 1 TABLET EVERY MORNING   aspirin 81 MG chewable tablet Chew 1 tablet (81 mg total) by mouth 2 (two) times daily for 28 days. Replaces: aspirin EC 81 MG tablet   atorvastatin 80 MG tablet Commonly known as: LIPITOR TAKE 1  TABLET EVERY DAY  AT  6PM (KEEP MD APPOINTMENT)   esomeprazole 20 MG capsule Commonly known as: NEXIUM Take 20 mg by mouth daily as needed (acid reflux).   furosemide 20 MG tablet Commonly known as: LASIX Take 20 mg by mouth daily as needed for edema.   irbesartan 300 MG tablet Commonly known as: AVAPRO Take 1 tablet (300 mg total) by mouth daily.   methocarbamol 500 MG tablet Commonly known as: ROBAXIN Take 1 tablet (500 mg total) by mouth every 6 (six) hours as needed for muscle spasms (muscle pain). What changed:  medication strength how much to take when to take this reasons to take this   metoprolol  succinate 100 MG 24 hr tablet Commonly known as: TOPROL-XL TAKE 1 TABLET EVERY DAY   polyethylene glycol 17 g packet Commonly known as: MIRALAX / GLYCOLAX Take 17 g by mouth 2 (two) times daily.   senna 8.6 MG Tabs tablet Commonly known as: SENOKOT Take 2 tablets (17.2 mg total) by mouth at bedtime for 14 days.               Discharge Care Instructions  (From admission, onward)           Start     Ordered   11/28/22 0000  Change dressing       Comments: Maintain surgical dressing until follow up in the clinic. If the edges start to pull up, may reinforce with tape. If the dressing is no longer working, may remove and cover with gauze and tape, but must keep the area dry and clean.  Call with any questions or concerns.   11/28/22 0739            Diagnostic Studies: No results found.  Disposition: Discharge disposition: 01-Home or Self Care       Discharge Instructions     Call MD / Call 911   Complete by: As directed    If you experience chest pain or shortness of breath, CALL 911 and be transported to the hospital emergency room.  If you develope a fever above 101 F, pus (white drainage) or increased drainage or redness at the wound, or calf pain, call your surgeon's office.   Change dressing   Complete by: As directed    Maintain surgical dressing until follow up in the clinic. If the edges start to pull up, may reinforce with tape. If the dressing is no longer working, may remove and cover with gauze and tape, but must keep the area dry and clean.  Call with any questions or concerns.   Constipation Prevention   Complete by: As directed    Drink plenty of fluids.  Prune juice may be helpful.  You may use a stool softener, such as Colace (over the counter) 100 mg twice a day.  Use MiraLax (over the counter) for constipation as needed.   Diet - low sodium heart healthy   Complete by: As directed    Increase activity slowly as tolerated   Complete by: As  directed    Weight bearing as tolerated with assist device (walker, cane, etc) as directed, use it as long as suggested by your surgeon or therapist, typically at least 4-6 weeks.   Post-operative opioid taper instructions:   Complete by: As directed    POST-OPERATIVE OPIOID TAPER INSTRUCTIONS: It is important to wean off of your opioid medication as soon as possible. If you do not need pain medication after your surgery it is  ok to stop day one. Opioids include: Codeine, Hydrocodone(Norco, Vicodin), Oxycodone(Percocet, oxycontin) and hydromorphone amongst others.  Long term and even short term use of opiods can cause: Increased pain response Dependence Constipation Depression Respiratory depression And more.  Withdrawal symptoms can include Flu like symptoms Nausea, vomiting And more Techniques to manage these symptoms Hydrate well Eat regular healthy meals Stay active Use relaxation techniques(deep breathing, meditating, yoga) Do Not substitute Alcohol to help with tapering If you have been on opioids for less than two weeks and do not have pain than it is ok to stop all together.  Plan to wean off of opioids This plan should start within one week post op of your joint replacement. Maintain the same interval or time between taking each dose and first decrease the dose.  Cut the total daily intake of opioids by one tablet each day Next start to increase the time between doses. The last dose that should be eliminated is the evening dose.      TED hose   Complete by: As directed    Use stockings (TED hose) for 2 weeks on both leg(s).  You may remove them at night for sleeping.        Follow-up Information     Durene Romans, MD. Schedule an appointment as soon as possible for a visit in 2 week(s).   Specialty: Orthopedic Surgery Contact information: [address] [place] [address] [phone]         [facility] Follow up.    Specialty: Home Health Services Why: will provide home physical therapy visits Contact information: [address] [place] [address] [phone]                  Signed: Cassandria Anger 12/03/2022, 7:29 AM

## 2022-12-05 DIAGNOSIS — K589 Irritable bowel syndrome without diarrhea: Secondary | ICD-10-CM | POA: Diagnosis not present

## 2022-12-05 DIAGNOSIS — I4892 Unspecified atrial flutter: Secondary | ICD-10-CM | POA: Diagnosis not present

## 2022-12-05 DIAGNOSIS — N401 Enlarged prostate with lower urinary tract symptoms: Secondary | ICD-10-CM | POA: Diagnosis not present

## 2022-12-05 DIAGNOSIS — I2510 Atherosclerotic heart disease of native coronary artery without angina pectoris: Secondary | ICD-10-CM | POA: Diagnosis not present

## 2022-12-05 DIAGNOSIS — I119 Hypertensive heart disease without heart failure: Secondary | ICD-10-CM | POA: Diagnosis not present

## 2022-12-05 DIAGNOSIS — Z96651 Presence of right artificial knee joint: Secondary | ICD-10-CM | POA: Diagnosis not present

## 2022-12-05 DIAGNOSIS — N138 Other obstructive and reflux uropathy: Secondary | ICD-10-CM | POA: Diagnosis not present

## 2022-12-05 DIAGNOSIS — Z471 Aftercare following joint replacement surgery: Secondary | ICD-10-CM | POA: Diagnosis not present

## 2022-12-05 DIAGNOSIS — M810 Age-related osteoporosis without current pathological fracture: Secondary | ICD-10-CM | POA: Diagnosis not present

## 2022-12-06 DIAGNOSIS — G894 Chronic pain syndrome: Secondary | ICD-10-CM | POA: Diagnosis not present

## 2022-12-06 DIAGNOSIS — G8929 Other chronic pain: Secondary | ICD-10-CM | POA: Diagnosis not present

## 2022-12-06 DIAGNOSIS — F1120 Opioid dependence, uncomplicated: Secondary | ICD-10-CM | POA: Diagnosis not present

## 2022-12-06 DIAGNOSIS — M48061 Spinal stenosis, lumbar region without neurogenic claudication: Secondary | ICD-10-CM | POA: Diagnosis not present

## 2022-12-06 DIAGNOSIS — Z79891 Long term (current) use of opiate analgesic: Secondary | ICD-10-CM | POA: Diagnosis not present

## 2022-12-06 DIAGNOSIS — M545 Low back pain, unspecified: Secondary | ICD-10-CM | POA: Diagnosis not present

## 2022-12-06 DIAGNOSIS — M47816 Spondylosis without myelopathy or radiculopathy, lumbar region: Secondary | ICD-10-CM | POA: Diagnosis not present

## 2022-12-06 DIAGNOSIS — M79631 Pain in right forearm: Secondary | ICD-10-CM | POA: Diagnosis not present

## 2022-12-06 DIAGNOSIS — Z79899 Other long term (current) drug therapy: Secondary | ICD-10-CM | POA: Diagnosis not present

## 2022-12-06 DIAGNOSIS — M25561 Pain in right knee: Secondary | ICD-10-CM | POA: Diagnosis not present

## 2022-12-07 DIAGNOSIS — N401 Enlarged prostate with lower urinary tract symptoms: Secondary | ICD-10-CM | POA: Diagnosis not present

## 2022-12-07 DIAGNOSIS — N138 Other obstructive and reflux uropathy: Secondary | ICD-10-CM | POA: Diagnosis not present

## 2022-12-07 DIAGNOSIS — Z96651 Presence of right artificial knee joint: Secondary | ICD-10-CM | POA: Diagnosis not present

## 2022-12-07 DIAGNOSIS — M810 Age-related osteoporosis without current pathological fracture: Secondary | ICD-10-CM | POA: Diagnosis not present

## 2022-12-07 DIAGNOSIS — I2510 Atherosclerotic heart disease of native coronary artery without angina pectoris: Secondary | ICD-10-CM | POA: Diagnosis not present

## 2022-12-07 DIAGNOSIS — Z471 Aftercare following joint replacement surgery: Secondary | ICD-10-CM | POA: Diagnosis not present

## 2022-12-07 DIAGNOSIS — K589 Irritable bowel syndrome without diarrhea: Secondary | ICD-10-CM | POA: Diagnosis not present

## 2022-12-07 DIAGNOSIS — I119 Hypertensive heart disease without heart failure: Secondary | ICD-10-CM | POA: Diagnosis not present

## 2022-12-07 DIAGNOSIS — I4892 Unspecified atrial flutter: Secondary | ICD-10-CM | POA: Diagnosis not present

## 2022-12-10 DIAGNOSIS — I119 Hypertensive heart disease without heart failure: Secondary | ICD-10-CM | POA: Diagnosis not present

## 2022-12-10 DIAGNOSIS — I2510 Atherosclerotic heart disease of native coronary artery without angina pectoris: Secondary | ICD-10-CM | POA: Diagnosis not present

## 2022-12-10 DIAGNOSIS — K589 Irritable bowel syndrome without diarrhea: Secondary | ICD-10-CM | POA: Diagnosis not present

## 2022-12-10 DIAGNOSIS — M810 Age-related osteoporosis without current pathological fracture: Secondary | ICD-10-CM | POA: Diagnosis not present

## 2022-12-10 DIAGNOSIS — Z96651 Presence of right artificial knee joint: Secondary | ICD-10-CM | POA: Diagnosis not present

## 2022-12-10 DIAGNOSIS — I4892 Unspecified atrial flutter: Secondary | ICD-10-CM | POA: Diagnosis not present

## 2022-12-10 DIAGNOSIS — N138 Other obstructive and reflux uropathy: Secondary | ICD-10-CM | POA: Diagnosis not present

## 2022-12-10 DIAGNOSIS — N401 Enlarged prostate with lower urinary tract symptoms: Secondary | ICD-10-CM | POA: Diagnosis not present

## 2022-12-10 DIAGNOSIS — Z471 Aftercare following joint replacement surgery: Secondary | ICD-10-CM | POA: Diagnosis not present

## 2022-12-18 ENCOUNTER — Ambulatory Visit: Payer: Medicare PPO | Admitting: Urology

## 2022-12-18 DIAGNOSIS — M25661 Stiffness of right knee, not elsewhere classified: Secondary | ICD-10-CM | POA: Diagnosis not present

## 2022-12-18 DIAGNOSIS — M25561 Pain in right knee: Secondary | ICD-10-CM | POA: Diagnosis not present

## 2022-12-21 DIAGNOSIS — M25661 Stiffness of right knee, not elsewhere classified: Secondary | ICD-10-CM | POA: Diagnosis not present

## 2022-12-21 DIAGNOSIS — M25561 Pain in right knee: Secondary | ICD-10-CM | POA: Diagnosis not present

## 2022-12-24 DIAGNOSIS — M25661 Stiffness of right knee, not elsewhere classified: Secondary | ICD-10-CM | POA: Diagnosis not present

## 2022-12-24 DIAGNOSIS — M25561 Pain in right knee: Secondary | ICD-10-CM | POA: Diagnosis not present

## 2022-12-26 DIAGNOSIS — M25561 Pain in right knee: Secondary | ICD-10-CM | POA: Diagnosis not present

## 2022-12-26 DIAGNOSIS — M25661 Stiffness of right knee, not elsewhere classified: Secondary | ICD-10-CM | POA: Diagnosis not present

## 2022-12-27 DIAGNOSIS — M25561 Pain in right knee: Secondary | ICD-10-CM | POA: Diagnosis not present

## 2022-12-27 DIAGNOSIS — M25661 Stiffness of right knee, not elsewhere classified: Secondary | ICD-10-CM | POA: Diagnosis not present

## 2022-12-31 DIAGNOSIS — M25661 Stiffness of right knee, not elsewhere classified: Secondary | ICD-10-CM | POA: Diagnosis not present

## 2022-12-31 DIAGNOSIS — M25561 Pain in right knee: Secondary | ICD-10-CM | POA: Diagnosis not present

## 2023-01-02 DIAGNOSIS — I129 Hypertensive chronic kidney disease with stage 1 through stage 4 chronic kidney disease, or unspecified chronic kidney disease: Secondary | ICD-10-CM | POA: Diagnosis not present

## 2023-01-02 DIAGNOSIS — M79631 Pain in right forearm: Secondary | ICD-10-CM | POA: Diagnosis not present

## 2023-01-02 DIAGNOSIS — G894 Chronic pain syndrome: Secondary | ICD-10-CM | POA: Diagnosis not present

## 2023-01-02 DIAGNOSIS — F1120 Opioid dependence, uncomplicated: Secondary | ICD-10-CM | POA: Diagnosis not present

## 2023-01-02 DIAGNOSIS — M25561 Pain in right knee: Secondary | ICD-10-CM | POA: Diagnosis not present

## 2023-01-02 DIAGNOSIS — Z79899 Other long term (current) drug therapy: Secondary | ICD-10-CM | POA: Diagnosis not present

## 2023-01-02 DIAGNOSIS — M48061 Spinal stenosis, lumbar region without neurogenic claudication: Secondary | ICD-10-CM | POA: Diagnosis not present

## 2023-01-02 DIAGNOSIS — Z79891 Long term (current) use of opiate analgesic: Secondary | ICD-10-CM | POA: Diagnosis not present

## 2023-01-02 DIAGNOSIS — M545 Low back pain, unspecified: Secondary | ICD-10-CM | POA: Diagnosis not present

## 2023-01-02 DIAGNOSIS — M25661 Stiffness of right knee, not elsewhere classified: Secondary | ICD-10-CM | POA: Diagnosis not present

## 2023-01-02 DIAGNOSIS — G8929 Other chronic pain: Secondary | ICD-10-CM | POA: Diagnosis not present

## 2023-01-02 DIAGNOSIS — M47816 Spondylosis without myelopathy or radiculopathy, lumbar region: Secondary | ICD-10-CM | POA: Diagnosis not present

## 2023-01-02 DIAGNOSIS — E1122 Type 2 diabetes mellitus with diabetic chronic kidney disease: Secondary | ICD-10-CM | POA: Diagnosis not present

## 2023-01-04 DIAGNOSIS — M25661 Stiffness of right knee, not elsewhere classified: Secondary | ICD-10-CM | POA: Diagnosis not present

## 2023-01-04 DIAGNOSIS — M25561 Pain in right knee: Secondary | ICD-10-CM | POA: Diagnosis not present

## 2023-01-07 DIAGNOSIS — M25561 Pain in right knee: Secondary | ICD-10-CM | POA: Diagnosis not present

## 2023-01-07 DIAGNOSIS — M25661 Stiffness of right knee, not elsewhere classified: Secondary | ICD-10-CM | POA: Diagnosis not present

## 2023-01-09 DIAGNOSIS — M25661 Stiffness of right knee, not elsewhere classified: Secondary | ICD-10-CM | POA: Diagnosis not present

## 2023-01-09 DIAGNOSIS — M25561 Pain in right knee: Secondary | ICD-10-CM | POA: Diagnosis not present

## 2023-01-10 DIAGNOSIS — Z955 Presence of coronary angioplasty implant and graft: Secondary | ICD-10-CM | POA: Diagnosis not present

## 2023-01-10 DIAGNOSIS — G8929 Other chronic pain: Secondary | ICD-10-CM | POA: Diagnosis not present

## 2023-01-10 DIAGNOSIS — M545 Low back pain, unspecified: Secondary | ICD-10-CM | POA: Diagnosis not present

## 2023-01-10 DIAGNOSIS — E119 Type 2 diabetes mellitus without complications: Secondary | ICD-10-CM | POA: Diagnosis not present

## 2023-01-10 DIAGNOSIS — I129 Hypertensive chronic kidney disease with stage 1 through stage 4 chronic kidney disease, or unspecified chronic kidney disease: Secondary | ICD-10-CM | POA: Diagnosis not present

## 2023-01-10 DIAGNOSIS — E782 Mixed hyperlipidemia: Secondary | ICD-10-CM | POA: Diagnosis not present

## 2023-01-10 DIAGNOSIS — E1122 Type 2 diabetes mellitus with diabetic chronic kidney disease: Secondary | ICD-10-CM | POA: Diagnosis not present

## 2023-01-10 DIAGNOSIS — G894 Chronic pain syndrome: Secondary | ICD-10-CM | POA: Diagnosis not present

## 2023-01-11 DIAGNOSIS — M25661 Stiffness of right knee, not elsewhere classified: Secondary | ICD-10-CM | POA: Diagnosis not present

## 2023-01-11 DIAGNOSIS — M25561 Pain in right knee: Secondary | ICD-10-CM | POA: Diagnosis not present

## 2023-01-14 DIAGNOSIS — M25561 Pain in right knee: Secondary | ICD-10-CM | POA: Diagnosis not present

## 2023-01-14 DIAGNOSIS — M25661 Stiffness of right knee, not elsewhere classified: Secondary | ICD-10-CM | POA: Diagnosis not present

## 2023-01-18 DIAGNOSIS — Z96651 Presence of right artificial knee joint: Secondary | ICD-10-CM | POA: Diagnosis not present

## 2023-01-30 DIAGNOSIS — M79631 Pain in right forearm: Secondary | ICD-10-CM | POA: Diagnosis not present

## 2023-01-30 DIAGNOSIS — G8929 Other chronic pain: Secondary | ICD-10-CM | POA: Diagnosis not present

## 2023-01-30 DIAGNOSIS — F1120 Opioid dependence, uncomplicated: Secondary | ICD-10-CM | POA: Diagnosis not present

## 2023-01-30 DIAGNOSIS — G894 Chronic pain syndrome: Secondary | ICD-10-CM | POA: Diagnosis not present

## 2023-01-30 DIAGNOSIS — M25561 Pain in right knee: Secondary | ICD-10-CM | POA: Diagnosis not present

## 2023-01-30 DIAGNOSIS — Z79891 Long term (current) use of opiate analgesic: Secondary | ICD-10-CM | POA: Diagnosis not present

## 2023-01-30 DIAGNOSIS — M48061 Spinal stenosis, lumbar region without neurogenic claudication: Secondary | ICD-10-CM | POA: Diagnosis not present

## 2023-01-30 DIAGNOSIS — M545 Low back pain, unspecified: Secondary | ICD-10-CM | POA: Diagnosis not present

## 2023-01-30 DIAGNOSIS — Z79899 Other long term (current) drug therapy: Secondary | ICD-10-CM | POA: Diagnosis not present

## 2023-01-30 DIAGNOSIS — M47816 Spondylosis without myelopathy or radiculopathy, lumbar region: Secondary | ICD-10-CM | POA: Diagnosis not present

## 2023-02-01 DIAGNOSIS — M25661 Stiffness of right knee, not elsewhere classified: Secondary | ICD-10-CM | POA: Diagnosis not present

## 2023-02-01 DIAGNOSIS — M25561 Pain in right knee: Secondary | ICD-10-CM | POA: Diagnosis not present

## 2023-02-06 DIAGNOSIS — M25561 Pain in right knee: Secondary | ICD-10-CM | POA: Diagnosis not present

## 2023-02-06 DIAGNOSIS — M25661 Stiffness of right knee, not elsewhere classified: Secondary | ICD-10-CM | POA: Diagnosis not present

## 2023-02-08 DIAGNOSIS — M25661 Stiffness of right knee, not elsewhere classified: Secondary | ICD-10-CM | POA: Diagnosis not present

## 2023-02-08 DIAGNOSIS — M25561 Pain in right knee: Secondary | ICD-10-CM | POA: Diagnosis not present

## 2023-02-11 ENCOUNTER — Encounter: Payer: Self-pay | Admitting: Urology

## 2023-02-11 ENCOUNTER — Ambulatory Visit: Payer: Medicare PPO | Admitting: Urology

## 2023-02-11 VITALS — BP 152/72 | HR 72

## 2023-02-11 DIAGNOSIS — R319 Hematuria, unspecified: Secondary | ICD-10-CM | POA: Diagnosis not present

## 2023-02-11 DIAGNOSIS — N400 Enlarged prostate without lower urinary tract symptoms: Secondary | ICD-10-CM

## 2023-02-11 DIAGNOSIS — R300 Dysuria: Secondary | ICD-10-CM

## 2023-02-11 LAB — URINALYSIS, ROUTINE W REFLEX MICROSCOPIC
Bilirubin, UA: NEGATIVE
Glucose, UA: NEGATIVE
Ketones, UA: NEGATIVE
Leukocytes,UA: NEGATIVE
Nitrite, UA: NEGATIVE
Protein,UA: NEGATIVE
RBC, UA: NEGATIVE
Specific Gravity, UA: 1.025 (ref 1.005–1.030)
Urobilinogen, Ur: 0.2 mg/dL (ref 0.2–1.0)
pH, UA: 6 (ref 5.0–7.5)

## 2023-02-11 NOTE — Progress Notes (Signed)
History of Present Illness: Status post transurethral resection of the prostate by me in June, 2015.  About 30 g of prostate resected, all benign.  Subsequently seen in 2020 by Dr. Alvester Morin at [facility].  Had initial gross hematuria.  Evaluation revealed probable prostatic source.  Here today for routine check.  Urinary symptoms are not bothersome.  Does have a urgency and occasional urgency incontinence.  Good stream, however. IPSS 5.      Past Medical History:  Diagnosis Date   Acute ST elevation myocardial infarction (STEMI) of anterior wall (HCC) 03/07/2017   Arthritis    BPH (benign prostatic hypertrophy)    CAD (coronary artery disease)    a. Lexiscan Myoview (11/2013):  Normal stress nuclear study.  LV Ejection Fraction: 65%   Complication of anesthesia    hx PONV   Degenerative joint disease    GERD (gastroesophageal reflux disease)    H/O hiatal hernia    History of atrial flutter    REMOTE HX ON TREADMILL   History of colon polyps    Hyperlipidemia    Hypertension    IBS (irritable bowel syndrome)    Lower urinary tract symptoms (LUTS)    OSA (obstructive sleep apnea) 03/26/2016   RBBB (right bundle branch block)    S/P drug eluting coronary stent placement    11-26-2001   X2 DES  TO LAD &  BMS   Schatzki's ring    Status post dilation of esophageal narrowing     Past Surgical History:  Procedure Laterality Date   APPENDECTOMY     CARDIOVASCULAR STRESS TEST  12/09/2013   NUCLEAR LEXISCAN STUDY--  RESULTS PENDING   CARDIOVASCULAR STRESS TEST  04/2011  DR CRENSHAW   NORMAL /  NO ISCHEMIA/  EF 67%   CHOLECYSTECTOMY     COLONOSCOPY  05/29/2011   RMR: tubular adenoma   COLONOSCOPY WITH PROPOFOL N/A 05/23/2020   ;  Surgeon: Corbin Ade, MD;  Nonbleeding internal hemorrhoids, diverticulosis in sigmoid and descending colon, 10 polyps removed.Pathology revealed tubular adenomas.  Recommended repeat colonoscopy in 3 years if health permits.   CORONARY  ANGIOPLASTY WITH STENT PLACEMENT  11-26-2001  DR Charlies Constable   DES (Cypher) to LAD 70%/  BMS (Express) to OSTIAL D1 80%/   CFX  &  RCA normal/  EF 65%   CORONARY/GRAFT ACUTE MI REVASCULARIZATION N/A 03/07/2017   Procedure: Coronary/Graft Acute MI Revascularization;  Surgeon: Tonny Bollman, MD;  Location: [facility] INVASIVE CV LAB;  Service: Cardiovascular;  Laterality: N/A;   ESOPHAGOGASTRODUODENOSCOPY  04/05/2004   RMR: Small hiatal hernia/Questionable subtle Schatzki's ring, status post dilation as described   ESOPHAGOGASTRODUODENOSCOPY  05/2011   RMR: small hiatal hernia, empiric dilation with 20F and 77F Maloney   ESOPHAGOGASTRODUODENOSCOPY (EGD) WITH ESOPHAGEAL DILATION N/A 01/16/2013   Dr. Rourk:baggy somewhat atonic esophagus-query underlying esophageal motility disorder. Status post passage of a Maloney dilator (large bore) empirically. Status post esophageal biopsy/Reflux symptoms are well controlled on Nexium.benign path   ESOPHAGOGASTRODUODENOSCOPY (EGD) WITH PROPOFOL N/A 05/23/2020   Surgeon: Corbin Ade, MD; Somewhat baggy esophageal body, otherwise normal esophagus s/p empiric dilation, small hiatal hernia, otherwise normal exam.   LEFT HEART CATH AND CORONARY ANGIOGRAPHY N/A 03/07/2017   Procedure: LEFT HEART CATH AND CORONARY ANGIOGRAPHY;  Surgeon: Tonny Bollman, MD;  Location: [facility] INVASIVE CV LAB;  Service: Cardiovascular;  Laterality: N/A;   MALONEY DILATION N/A 05/23/2020   Procedure: Elease Hashimoto DILATION;  Surgeon: Corbin Ade, MD;  [facility];  Service: Endoscopy;  Laterality: N/A;   POLYPECTOMY  05/23/2020   Procedure: POLYPECTOMY;  Surgeon: Corbin Ade, MD;  [facility];  Service: Endoscopy;;   TOTAL HIP ARTHROPLASTY Left 09/07/2003   TOTAL KNEE ARTHROPLASTY Left 07/27/2002   TOTAL KNEE ARTHROPLASTY Right 11/27/2022   Procedure: TOTAL KNEE ARTHROPLASTY;  Surgeon: Durene Romans, MD;  Location: [facility];  Service: Orthopedics;  Laterality: Right;    TRANSURETHRAL RESECTION OF PROSTATE N/A 12/21/2013   Procedure: TRANSURETHRAL RESECTION OF THE PROSTATE WITH GYRUS INSTRUMENTS;  Surgeon: Chelsea Aus, MD;  Location: [facility];  Service: Urology;  Laterality: N/A;    Home Medications:  Allergies as of 02/11/2023       Reactions   Codeine Nausea And Vomiting        Medication List        Accurate as of February 11, 2023  8:53 AM. If you have any questions, ask your nurse or doctor.          amLODipine 10 MG tablet Commonly known as: NORVASC TAKE 1 TABLET EVERY MORNING   atorvastatin 80 MG tablet Commonly known as: LIPITOR TAKE 1 TABLET EVERY DAY  AT  6PM (KEEP MD APPOINTMENT)   esomeprazole 20 MG capsule Commonly known as: NEXIUM Take 20 mg by mouth daily as needed (acid reflux).   furosemide 20 MG tablet Commonly known as: LASIX Take 20 mg by mouth daily as needed for edema.   irbesartan 300 MG tablet Commonly known as: AVAPRO Take 1 tablet (300 mg total) by mouth daily.   methocarbamol 500 MG tablet Commonly known as: ROBAXIN Take 1 tablet (500 mg total) by mouth every 6 (six) hours as needed for muscle spasms (muscle pain).   metoprolol succinate 100 MG 24 hr tablet Commonly known as: TOPROL-XL TAKE 1 TABLET EVERY DAY   polyethylene glycol 17 g packet Commonly known as: MIRALAX / GLYCOLAX Take 17 g by mouth 2 (two) times daily.        Allergies:  Allergies  Allergen Reactions   Codeine Nausea And Vomiting    Family History  Problem Relation Age of Onset   Cancer Father        Lung   Hypertension Mother    Coronary artery disease Mother    Kidney failure Mother    Cancer Sister        breast carcinoma   Hypertension Sister    Colon cancer Neg Hx     Social History:  reports that he has never smoked. He has never used smokeless tobacco. He reports that he does not currently use alcohol. He reports that he does not use drugs.  ROS: A complete review of systems  was performed.  All systems are negative except for pertinent findings as noted.  Physical Exam:  Vital signs in last 24 hours: There were no vitals taken for this visit. Constitutional:  Alert and oriented, No acute distress Cardiovascular: Regular rate  Respiratory: Normal respiratory effort GI: Abdomen is soft, nontender, nondistended, no abdominal masses. No CVAT.  Genitourinary: Normal male phallus, left testicle normal, right atrophic.  No inguinal hernias. Lymphatic: No lymphadenopathy Neurologic: Grossly intact, no focal deficits Psychiatric: Normal mood and affect \  I have reviewed notes from previous visits to [facility]  I have reviewed urinalysis results  I have independently reviewed prior imaging  I have reviewed prior PSA and pathology results     Impression/Assessment:  1.  BPH with history of  TURP.  He does have urinary urgency but not terribly bothersome  2.  Rare initial hematuria most likely of prostate source.  Urine clear today  Plan:  1.  Reassurance regarding his urinary symptoms and rare hematuria  2.  At his request I will have him come back in a year for recheck.

## 2023-02-12 DIAGNOSIS — M25561 Pain in right knee: Secondary | ICD-10-CM | POA: Diagnosis not present

## 2023-02-12 DIAGNOSIS — M25661 Stiffness of right knee, not elsewhere classified: Secondary | ICD-10-CM | POA: Diagnosis not present

## 2023-02-15 DIAGNOSIS — M25561 Pain in right knee: Secondary | ICD-10-CM | POA: Diagnosis not present

## 2023-02-15 DIAGNOSIS — M25661 Stiffness of right knee, not elsewhere classified: Secondary | ICD-10-CM | POA: Diagnosis not present

## 2023-02-19 DIAGNOSIS — M25661 Stiffness of right knee, not elsewhere classified: Secondary | ICD-10-CM | POA: Diagnosis not present

## 2023-02-19 DIAGNOSIS — M25561 Pain in right knee: Secondary | ICD-10-CM | POA: Diagnosis not present

## 2023-02-26 DIAGNOSIS — M25561 Pain in right knee: Secondary | ICD-10-CM | POA: Diagnosis not present

## 2023-02-26 DIAGNOSIS — M25661 Stiffness of right knee, not elsewhere classified: Secondary | ICD-10-CM | POA: Diagnosis not present

## 2023-02-27 DIAGNOSIS — G8929 Other chronic pain: Secondary | ICD-10-CM | POA: Diagnosis not present

## 2023-02-27 DIAGNOSIS — M79631 Pain in right forearm: Secondary | ICD-10-CM | POA: Diagnosis not present

## 2023-02-27 DIAGNOSIS — M545 Low back pain, unspecified: Secondary | ICD-10-CM | POA: Diagnosis not present

## 2023-02-27 DIAGNOSIS — M25561 Pain in right knee: Secondary | ICD-10-CM | POA: Diagnosis not present

## 2023-02-27 DIAGNOSIS — Z79891 Long term (current) use of opiate analgesic: Secondary | ICD-10-CM | POA: Diagnosis not present

## 2023-02-27 DIAGNOSIS — M48061 Spinal stenosis, lumbar region without neurogenic claudication: Secondary | ICD-10-CM | POA: Diagnosis not present

## 2023-02-27 DIAGNOSIS — M47816 Spondylosis without myelopathy or radiculopathy, lumbar region: Secondary | ICD-10-CM | POA: Diagnosis not present

## 2023-02-27 DIAGNOSIS — G894 Chronic pain syndrome: Secondary | ICD-10-CM | POA: Diagnosis not present

## 2023-02-27 DIAGNOSIS — F1120 Opioid dependence, uncomplicated: Secondary | ICD-10-CM | POA: Diagnosis not present

## 2023-03-06 DIAGNOSIS — M25561 Pain in right knee: Secondary | ICD-10-CM | POA: Diagnosis not present

## 2023-03-06 DIAGNOSIS — M25661 Stiffness of right knee, not elsewhere classified: Secondary | ICD-10-CM | POA: Diagnosis not present

## 2023-03-13 DIAGNOSIS — M25561 Pain in right knee: Secondary | ICD-10-CM | POA: Diagnosis not present

## 2023-03-13 DIAGNOSIS — M25661 Stiffness of right knee, not elsewhere classified: Secondary | ICD-10-CM | POA: Diagnosis not present

## 2023-03-15 DIAGNOSIS — M25561 Pain in right knee: Secondary | ICD-10-CM | POA: Diagnosis not present

## 2023-03-15 DIAGNOSIS — M25661 Stiffness of right knee, not elsewhere classified: Secondary | ICD-10-CM | POA: Diagnosis not present

## 2023-03-20 DIAGNOSIS — M25661 Stiffness of right knee, not elsewhere classified: Secondary | ICD-10-CM | POA: Diagnosis not present

## 2023-03-20 DIAGNOSIS — M25561 Pain in right knee: Secondary | ICD-10-CM | POA: Diagnosis not present

## 2023-03-22 DIAGNOSIS — M25561 Pain in right knee: Secondary | ICD-10-CM | POA: Diagnosis not present

## 2023-03-22 DIAGNOSIS — M25661 Stiffness of right knee, not elsewhere classified: Secondary | ICD-10-CM | POA: Diagnosis not present

## 2023-03-26 DIAGNOSIS — M25561 Pain in right knee: Secondary | ICD-10-CM | POA: Diagnosis not present

## 2023-03-26 DIAGNOSIS — M545 Low back pain, unspecified: Secondary | ICD-10-CM | POA: Diagnosis not present

## 2023-03-26 DIAGNOSIS — M79631 Pain in right forearm: Secondary | ICD-10-CM | POA: Diagnosis not present

## 2023-03-26 DIAGNOSIS — Z79891 Long term (current) use of opiate analgesic: Secondary | ICD-10-CM | POA: Diagnosis not present

## 2023-03-26 DIAGNOSIS — G894 Chronic pain syndrome: Secondary | ICD-10-CM | POA: Diagnosis not present

## 2023-03-26 DIAGNOSIS — F1120 Opioid dependence, uncomplicated: Secondary | ICD-10-CM | POA: Diagnosis not present

## 2023-03-26 DIAGNOSIS — M48061 Spinal stenosis, lumbar region without neurogenic claudication: Secondary | ICD-10-CM | POA: Diagnosis not present

## 2023-03-26 DIAGNOSIS — G8929 Other chronic pain: Secondary | ICD-10-CM | POA: Diagnosis not present

## 2023-03-26 DIAGNOSIS — M47816 Spondylosis without myelopathy or radiculopathy, lumbar region: Secondary | ICD-10-CM | POA: Diagnosis not present

## 2023-03-27 DIAGNOSIS — M25661 Stiffness of right knee, not elsewhere classified: Secondary | ICD-10-CM | POA: Diagnosis not present

## 2023-03-27 DIAGNOSIS — M25561 Pain in right knee: Secondary | ICD-10-CM | POA: Diagnosis not present

## 2023-03-29 DIAGNOSIS — M25661 Stiffness of right knee, not elsewhere classified: Secondary | ICD-10-CM | POA: Diagnosis not present

## 2023-03-29 DIAGNOSIS — M25561 Pain in right knee: Secondary | ICD-10-CM | POA: Diagnosis not present

## 2023-04-05 DIAGNOSIS — M25561 Pain in right knee: Secondary | ICD-10-CM | POA: Diagnosis not present

## 2023-04-05 DIAGNOSIS — M25661 Stiffness of right knee, not elsewhere classified: Secondary | ICD-10-CM | POA: Diagnosis not present

## 2023-04-08 DIAGNOSIS — M25561 Pain in right knee: Secondary | ICD-10-CM | POA: Diagnosis not present

## 2023-04-08 DIAGNOSIS — M25661 Stiffness of right knee, not elsewhere classified: Secondary | ICD-10-CM | POA: Diagnosis not present

## 2023-04-10 ENCOUNTER — Other Ambulatory Visit: Payer: Self-pay | Admitting: Cardiology

## 2023-04-10 DIAGNOSIS — M25561 Pain in right knee: Secondary | ICD-10-CM | POA: Diagnosis not present

## 2023-04-10 DIAGNOSIS — M25661 Stiffness of right knee, not elsewhere classified: Secondary | ICD-10-CM | POA: Diagnosis not present

## 2023-04-12 DIAGNOSIS — M25661 Stiffness of right knee, not elsewhere classified: Secondary | ICD-10-CM | POA: Diagnosis not present

## 2023-04-12 DIAGNOSIS — M25561 Pain in right knee: Secondary | ICD-10-CM | POA: Diagnosis not present

## 2023-04-16 ENCOUNTER — Other Ambulatory Visit: Payer: Self-pay | Admitting: Cardiology

## 2023-04-23 DIAGNOSIS — F1120 Opioid dependence, uncomplicated: Secondary | ICD-10-CM | POA: Diagnosis not present

## 2023-04-23 DIAGNOSIS — M48061 Spinal stenosis, lumbar region without neurogenic claudication: Secondary | ICD-10-CM | POA: Diagnosis not present

## 2023-04-23 DIAGNOSIS — M79631 Pain in right forearm: Secondary | ICD-10-CM | POA: Diagnosis not present

## 2023-04-23 DIAGNOSIS — M47816 Spondylosis without myelopathy or radiculopathy, lumbar region: Secondary | ICD-10-CM | POA: Diagnosis not present

## 2023-04-23 DIAGNOSIS — M25561 Pain in right knee: Secondary | ICD-10-CM | POA: Diagnosis not present

## 2023-04-23 DIAGNOSIS — G894 Chronic pain syndrome: Secondary | ICD-10-CM | POA: Diagnosis not present

## 2023-04-23 DIAGNOSIS — M545 Low back pain, unspecified: Secondary | ICD-10-CM | POA: Diagnosis not present

## 2023-04-23 DIAGNOSIS — Z79891 Long term (current) use of opiate analgesic: Secondary | ICD-10-CM | POA: Diagnosis not present

## 2023-04-23 DIAGNOSIS — G8929 Other chronic pain: Secondary | ICD-10-CM | POA: Diagnosis not present

## 2023-05-22 ENCOUNTER — Encounter: Payer: Self-pay | Admitting: *Deleted

## 2023-05-31 DIAGNOSIS — Z79891 Long term (current) use of opiate analgesic: Secondary | ICD-10-CM | POA: Diagnosis not present

## 2023-05-31 DIAGNOSIS — M79631 Pain in right forearm: Secondary | ICD-10-CM | POA: Diagnosis not present

## 2023-05-31 DIAGNOSIS — M545 Low back pain, unspecified: Secondary | ICD-10-CM | POA: Diagnosis not present

## 2023-05-31 DIAGNOSIS — G8929 Other chronic pain: Secondary | ICD-10-CM | POA: Diagnosis not present

## 2023-05-31 DIAGNOSIS — M25561 Pain in right knee: Secondary | ICD-10-CM | POA: Diagnosis not present

## 2023-05-31 DIAGNOSIS — G894 Chronic pain syndrome: Secondary | ICD-10-CM | POA: Diagnosis not present

## 2023-06-05 ENCOUNTER — Other Ambulatory Visit: Payer: Self-pay | Admitting: Cardiology

## 2023-06-28 DIAGNOSIS — M79631 Pain in right forearm: Secondary | ICD-10-CM | POA: Diagnosis not present

## 2023-06-28 DIAGNOSIS — M545 Low back pain, unspecified: Secondary | ICD-10-CM | POA: Diagnosis not present

## 2023-06-28 DIAGNOSIS — Z79891 Long term (current) use of opiate analgesic: Secondary | ICD-10-CM | POA: Diagnosis not present

## 2023-06-28 DIAGNOSIS — G8929 Other chronic pain: Secondary | ICD-10-CM | POA: Diagnosis not present

## 2023-06-28 DIAGNOSIS — M25561 Pain in right knee: Secondary | ICD-10-CM | POA: Diagnosis not present

## 2023-06-28 DIAGNOSIS — G894 Chronic pain syndrome: Secondary | ICD-10-CM | POA: Diagnosis not present

## 2023-07-26 DIAGNOSIS — M25561 Pain in right knee: Secondary | ICD-10-CM | POA: Diagnosis not present

## 2023-07-26 DIAGNOSIS — G8929 Other chronic pain: Secondary | ICD-10-CM | POA: Diagnosis not present

## 2023-07-26 DIAGNOSIS — Z79891 Long term (current) use of opiate analgesic: Secondary | ICD-10-CM | POA: Diagnosis not present

## 2023-07-26 DIAGNOSIS — G894 Chronic pain syndrome: Secondary | ICD-10-CM | POA: Diagnosis not present

## 2023-07-26 DIAGNOSIS — M79631 Pain in right forearm: Secondary | ICD-10-CM | POA: Diagnosis not present

## 2023-07-27 ENCOUNTER — Encounter: Payer: Self-pay | Admitting: Cardiology

## 2023-07-29 ENCOUNTER — Other Ambulatory Visit: Payer: Self-pay

## 2023-07-29 MED ORDER — IRBESARTAN 300 MG PO TABS: 300.0000 mg | ORAL_TABLET | Freq: Every day | ORAL | 0 refills | Status: DC

## 2023-07-31 DIAGNOSIS — I10 Essential (primary) hypertension: Secondary | ICD-10-CM | POA: Diagnosis not present

## 2023-07-31 DIAGNOSIS — E1122 Type 2 diabetes mellitus with diabetic chronic kidney disease: Secondary | ICD-10-CM | POA: Diagnosis not present

## 2023-08-02 DIAGNOSIS — E1122 Type 2 diabetes mellitus with diabetic chronic kidney disease: Secondary | ICD-10-CM | POA: Diagnosis not present

## 2023-08-02 DIAGNOSIS — Z0000 Encounter for general adult medical examination without abnormal findings: Secondary | ICD-10-CM | POA: Diagnosis not present

## 2023-08-02 DIAGNOSIS — Z0001 Encounter for general adult medical examination with abnormal findings: Secondary | ICD-10-CM | POA: Diagnosis not present

## 2023-08-02 DIAGNOSIS — N189 Chronic kidney disease, unspecified: Secondary | ICD-10-CM | POA: Diagnosis not present

## 2023-08-02 DIAGNOSIS — E119 Type 2 diabetes mellitus without complications: Secondary | ICD-10-CM | POA: Diagnosis not present

## 2023-08-02 DIAGNOSIS — I129 Hypertensive chronic kidney disease with stage 1 through stage 4 chronic kidney disease, or unspecified chronic kidney disease: Secondary | ICD-10-CM | POA: Diagnosis not present

## 2023-08-02 DIAGNOSIS — M545 Low back pain, unspecified: Secondary | ICD-10-CM | POA: Diagnosis not present

## 2023-08-02 DIAGNOSIS — Z23 Encounter for immunization: Secondary | ICD-10-CM | POA: Diagnosis not present

## 2023-08-05 ENCOUNTER — Ambulatory Visit: Payer: Medicare PPO | Admitting: Adult Health

## 2023-08-19 ENCOUNTER — Other Ambulatory Visit: Payer: Self-pay | Admitting: Cardiology

## 2023-08-23 ENCOUNTER — Ambulatory Visit: Payer: Medicare PPO | Admitting: Cardiology

## 2023-08-23 DIAGNOSIS — Z79891 Long term (current) use of opiate analgesic: Secondary | ICD-10-CM | POA: Diagnosis not present

## 2023-08-23 DIAGNOSIS — G8929 Other chronic pain: Secondary | ICD-10-CM | POA: Diagnosis not present

## 2023-08-23 DIAGNOSIS — M545 Low back pain, unspecified: Secondary | ICD-10-CM | POA: Diagnosis not present

## 2023-08-23 DIAGNOSIS — M25561 Pain in right knee: Secondary | ICD-10-CM | POA: Diagnosis not present

## 2023-08-23 DIAGNOSIS — G894 Chronic pain syndrome: Secondary | ICD-10-CM | POA: Diagnosis not present

## 2023-09-10 ENCOUNTER — Ambulatory Visit (INDEPENDENT_AMBULATORY_CARE_PROVIDER_SITE_OTHER): Admitting: Gastroenterology

## 2023-09-10 ENCOUNTER — Encounter: Payer: Self-pay | Admitting: Gastroenterology

## 2023-09-10 VITALS — BP 138/75 | HR 66 | Temp 97.5°F | Ht 69.0 in | Wt 305.2 lb

## 2023-09-10 DIAGNOSIS — R1310 Dysphagia, unspecified: Secondary | ICD-10-CM | POA: Diagnosis not present

## 2023-09-10 DIAGNOSIS — Z860101 Personal history of adenomatous and serrated colon polyps: Secondary | ICD-10-CM

## 2023-09-10 DIAGNOSIS — K5900 Constipation, unspecified: Secondary | ICD-10-CM | POA: Diagnosis not present

## 2023-09-10 DIAGNOSIS — K219 Gastro-esophageal reflux disease without esophagitis: Secondary | ICD-10-CM

## 2023-09-10 DIAGNOSIS — Z860100 Personal history of colon polyps, unspecified: Secondary | ICD-10-CM

## 2023-09-10 DIAGNOSIS — R1319 Other dysphagia: Secondary | ICD-10-CM

## 2023-09-10 NOTE — Progress Notes (Signed)
 GI Office Note    Referring Provider: Benita Stabile, MD Primary Care Physician:  Benita Stabile, MD Primary Gastroenterologist: Gerrit Friends.Rourk, MD  Date:  09/10/2023  ID:  JAQUAWN SAFFRAN, DOB [birth year], MRN [id number]   Chief Complaint   Chief Complaint  Patient presents with   Follow-up    Follow up. Having some gas and would like to have a colonoscopy    History of Present Illness  JERIMIE MANCUSO is a 83 y.o. male with a history of adenomatous colon polyps, chronic intermittent constipation, GERD, dysphagia with empiric dilations, STEMI, CAD, HLD, HTN, OSA presenting today for OV prior to colonoscopy.   EGD 05/23/2020:  -Somewhat boggy esophageal body, otherwise normal esophagus s/p empiric dilation -small hiatal hernia, exam normal otherwise   Colonoscopy 05/23/2020: - Nonbleeding internal hemorrhoids -diverticulosis in the sigmoid and descending colon -Ten 3 to 7 mm polyps in the descending colon and at the hepatic flexure -Path: tubular adenoma -Advised repeat in 3 years.    OV  November 2022 - he was having nausea with gagging for 1 week prior to visit about 1-2 times a day that was improved with taking Nexium and drinking cold water.  Symptoms were most commonly in the morning.  Was not eating within 3 hours of going to bed however was consuming spicy, fried, fatty foods frequently and adding hot sauce to his food.  He was only taking the Nexium as needed.  Is also reporting a lot of sinus drainage.  Using Linzess 145 mcg as needed to control his constipation (difficult for him to afford).  He was advised to resume Nexium 40 mg daily, counseled on GERD lifestyle modifications, and advised to continue Linzess 145 mcg daily as needed.  Pending symptoms, advised to consider reducing Nexium back to as needed if symptoms had slightly improved.  Last office visit 10/30/21.  Taking Nexium daily, previously only taking as needed.  No alarm symptoms.  Does have occasional chronic  nausea which is intermittent and usually related to medications.  Tries to not eat late but does admit to fatty/greasy and spicy foods.  Also hot sauce use.  Does not have a bowel movement every day, has tried MiraLAX in the past and is somewhat helpful but usually only the most helpful with the colonoscopy prep.  Linzess was too expensive and had significant diarrhea with this initially.  Reports adequate amounts of water intake. Gave samples of Linzess 145 mcg.  Discussed patient assistance if medication too expensive.  Continue Nexium 40 mg once daily, 30 minutes prior to breakfast.  Start fiber supplementation.  GERD diet reinforced. F/u 6 months.    Today:  Sometimes constipated and then may have some diarrhea and then have some normal stools. Just started feeling gassy for the last week. Feels like he has to go to the bathroom but then it is mostly gas. Can feel bloated in the middle of his abdomen.   In the past he has done well with Linzess as needed and that worked well for him. Would like some samples today.   Has been having reflux symptoms.  Has not been taking Nexium daily. If he stops reflux for about a week then he will have symptoms. Does have some occasional dysphagia but feels like pills will hang in his throat. Has had improvement with dilation the past. Has aq good appetite. No weight loss.   If he sits for long periods of time he will have some mild peripheral  swelling but if he stays active  he does not.   Has pain patch on as well for his back. Has been using the walker since his worsening back pain to help with his balance. Patches are helping with his pain. Has not done PT for his back abut has for his knee.   Has been told his iron has been low and he was told  that it could make his stool dark. No overt melena or brbpr.   Wt Readings from Last 3 Encounters:  09/10/23 (!) 305 lb 3.2 oz (138.4 kg)  11/27/22 297 lb (134.7 kg)  11/21/22 297 lb (134.7 kg)    Current  Outpatient Medications  Medication Sig Dispense Refill   amLODipine (NORVASC) 10 MG tablet TAKE 1 TABLET EVERY MORNING 90 tablet 3   atorvastatin (LIPITOR) 80 MG tablet Take 1 tablet (80 mg total) by mouth daily. 90 tablet 1   buprenorphine (BUTRANS) 10 MCG/HR PTWK 1 patch once a week.     esomeprazole (NEXIUM) 20 MG capsule Take 20 mg by mouth daily as needed (acid reflux).     furosemide (LASIX) 20 MG tablet Take 20 mg by mouth daily as needed for edema.     irbesartan (AVAPRO) 300 MG tablet Take 1 tablet (300 mg total) by mouth daily. 30 tablet 2   metoprolol succinate (TOPROL-XL) 100 MG 24 hr tablet TAKE 1 TABLET EVERY DAY 90 tablet 3   methocarbamol (ROBAXIN) 500 MG tablet Take 1 tablet (500 mg total) by mouth every 6 (six) hours as needed for muscle spasms (muscle pain). (Patient not taking: Reported on 09/10/2023) 40 tablet 2   polyethylene glycol (MIRALAX / GLYCOLAX) 17 g packet Take 17 g by mouth 2 (two) times daily. (Patient not taking: Reported on 09/10/2023) 14 each 0   No current facility-administered medications for this visit.    Past Medical History:  Diagnosis Date   Acute ST elevation myocardial infarction (STEMI) of anterior wall (HCC) 03/07/2017   Arthritis    BPH (benign prostatic hypertrophy)    CAD (coronary artery disease)    a. Lexiscan Myoview (11/2013):  Normal stress nuclear study.  LV Ejection Fraction: 65%   Complication of anesthesia    hx PONV   Degenerative joint disease    GERD (gastroesophageal reflux disease)    H/O hiatal hernia    History of atrial flutter    REMOTE HX ON TREADMILL   History of colon polyps    Hyperlipidemia    Hypertension    IBS (irritable bowel syndrome)    Lower urinary tract symptoms (LUTS)    OSA (obstructive sleep apnea) 03/26/2016   RBBB (right bundle branch block)    S/P drug eluting coronary stent placement    11-26-2001   X2 DES  TO LAD &  BMS   Schatzki's ring    Status post dilation of esophageal narrowing      Past Surgical History:  Procedure Laterality Date   APPENDECTOMY     CARDIOVASCULAR STRESS TEST  12/09/2013   NUCLEAR LEXISCAN STUDY--  RESULTS PENDING   CARDIOVASCULAR STRESS TEST  04/2011  DR CRENSHAW   NORMAL /  NO ISCHEMIA/  EF 67%   CHOLECYSTECTOMY     COLONOSCOPY  05/29/2011   RMR: tubular adenoma   COLONOSCOPY WITH PROPOFOL N/A 05/23/2020   ;  Surgeon: Corbin Ade, MD;  Nonbleeding internal hemorrhoids, diverticulosis in sigmoid and descending colon, 10 polyps removed.Pathology revealed tubular adenomas.  Recommended repeat colonoscopy in 3  years if health permits.   CORONARY ANGIOPLASTY WITH STENT PLACEMENT  11-26-2001  DR Charlies Constable   DES (Cypher) to LAD 70%/  BMS (Express) to OSTIAL D1 80%/   CFX  &  RCA normal/  EF 65%   CORONARY/GRAFT ACUTE MI REVASCULARIZATION N/A 03/07/2017   Procedure: Coronary/Graft Acute MI Revascularization;  Surgeon: Tonny Bollman, MD;  Location: [facility] INVASIVE CV LAB;  Service: Cardiovascular;  Laterality: N/A;   ESOPHAGOGASTRODUODENOSCOPY  04/05/2004   RMR: Small hiatal hernia/Questionable subtle Schatzki's ring, status post dilation as described   ESOPHAGOGASTRODUODENOSCOPY  05/2011   RMR: small hiatal hernia, empiric dilation with 54F and 95F Maloney   ESOPHAGOGASTRODUODENOSCOPY (EGD) WITH ESOPHAGEAL DILATION N/A 01/16/2013   Dr. Rourk:baggy somewhat atonic esophagus-query underlying esophageal motility disorder. Status post passage of a Maloney dilator (large bore) empirically. Status post esophageal biopsy/Reflux symptoms are well controlled on Nexium.benign path   ESOPHAGOGASTRODUODENOSCOPY (EGD) WITH PROPOFOL N/A 05/23/2020   Surgeon: Corbin Ade, MD; Somewhat baggy esophageal body, otherwise normal esophagus s/p empiric dilation, small hiatal hernia, otherwise normal exam.   LEFT HEART CATH AND CORONARY ANGIOGRAPHY N/A 03/07/2017   Procedure: LEFT HEART CATH AND CORONARY ANGIOGRAPHY;  Surgeon: Tonny Bollman, MD;  Location: [facility]  INVASIVE CV LAB;  Service: Cardiovascular;  Laterality: N/A;   MALONEY DILATION N/A 05/23/2020   Procedure: Elease Hashimoto DILATION;  Surgeon: Corbin Ade, MD;  [facility];  Service: Endoscopy;  Laterality: N/A;   POLYPECTOMY  05/23/2020   Procedure: POLYPECTOMY;  Surgeon: Corbin Ade, MD;  [facility];  Service: Endoscopy;;   TOTAL HIP ARTHROPLASTY Left 09/07/2003   TOTAL KNEE ARTHROPLASTY Left 07/27/2002   TOTAL KNEE ARTHROPLASTY Right 11/27/2022   Procedure: TOTAL KNEE ARTHROPLASTY;  Surgeon: Durene Romans, MD;  Location: [facility];  Service: Orthopedics;  Laterality: Right;   TRANSURETHRAL RESECTION OF PROSTATE N/A 12/21/2013   Procedure: TRANSURETHRAL RESECTION OF THE PROSTATE WITH GYRUS INSTRUMENTS;  Surgeon: Chelsea Aus, MD;  Location: [facility];  Service: Urology;  Laterality: N/A;    Family History  Problem Relation Age of Onset   Cancer Father        Lung   Hypertension Mother    Coronary artery disease Mother    Kidney failure Mother    Cancer Sister        breast carcinoma   Hypertension Sister    Colon cancer Neg Hx     Allergies as of 09/10/2023 - Review Complete 09/10/2023  Allergen Reaction Noted   Codeine Nausea And Vomiting 10/28/2008    Social History   Socioeconomic History   Marital status: Married    Spouse name: Not on file   Number of children: 2   Years of education: Not on file   Highest education level: Not on file  Occupational History   Occupation: retired  Tobacco Use   Smoking status: Never   Smokeless tobacco: Never  Vaping Use   Vaping status: Never Used  Substance and Sexual Activity   Alcohol use: Not Currently    Comment: on holidays   Drug use: No   Sexual activity: Never  Other Topics Concern   Not on file  Social History Narrative   Retired from [employer] at home with wife   Right handed   Caffeine: none   Social Drivers of Corporate investment banker  Strain: Not on file  Food Insecurity: No Food Insecurity (11/27/2022)   Hunger Vital Sign  Worried About Programme researcher, broadcasting/film/video in the Last Year: Never true    Ran Out of Food in the Last Year: Never true  Transportation Needs: No Transportation Needs (11/27/2022)   PRAPARE - Administrator, Civil Service (Medical): No    Lack of Transportation (Non-Medical): No  Physical Activity: Not on file  Stress: Not on file  Social Connections: Unknown (11/14/2021)   Received from [facility], [facility], [facility]   Social Network    Social Network: Not on file   Review of Systems   Gen: Denies fever, chills, anorexia. Denies fatigue, weakness, weight loss.  CV: Denies chest pain, palpitations, syncope, peripheral edema, and claudication. Resp: Denies dyspnea at rest, cough, wheezing, coughing up blood, and pleurisy. GI: See HPI Derm: Denies rash, itching, dry skin Psych: Denies depression, anxiety, memory loss, confusion. No homicidal or suicidal ideation.  Heme: Denies bruising, bleeding, and enlarged lymph nodes. + joint pain  Physical Exam   BP 138/75 (BP Location: Right Arm, Patient Position: Sitting, Cuff Size: Large)   Pulse 66   Temp (!) 97.5 F (36.4 C) (Temporal)   Ht 5\' 9"  (1.753 m)   Wt (!) 305 lb 3.2 oz (138.4 kg)   BMI 45.07 kg/m   General:   Alert and oriented. No distress noted. Pleasant and cooperative.  Head:  Normocephalic and atraumatic. Eyes:  Conjuctiva clear without scleral icterus. Mouth:  Oral mucosa pink and moist. Good dentition. No lesions. Lungs:  Clear to auscultation bilaterally. No wheezes, rales, or rhonchi. No distress.  Heart:  S1, S2 present without murmurs appreciated.  Abdomen:  +BS, soft, non-tender and non-distended. No rebound or guarding. No HSM or masses noted. Rectal: deferred Msk: Weak, unsteady gait.  Using four-wheel walker Neurologic:  Alert and  oriented x4 Psych:  Alert and cooperative. Normal mood and affect.  Assessment   SALIK GREWELL is a 83 y.o. male with a history of adenomatous colon polyps, chronic intermittent constipation, GERD, dysphagia with empiric dilations, STEMI, CAD, HLD, HTN, OSA presenting today for OV prior to colonoscopy.   History of colon polyps: Last colonoscopy in 2021 with ten tubular adenomas removed. Advised 3 year repeat, overdue since November 2024.  Continues to have some mild constipation.  Denies any significant abdominal pain, lack of appetite, early satiety.  Agreeable to proceed with surveillance colonoscopy, no issues with prior prep.  Will augment his prep given constipation.  GERD, dysphagia: Continues to have some intermittent reflux symptoms, usually symptoms return after 1 week off of PPI.  Also noted some pill dysphagia, no issues with solids or liquids.  Has had history of dilation in the past and had good results, would like to consider repeat upper endoscopy.  Given his symptoms we will repeat upper endoscopy with dilation.  Constipation: Continues to have intermittent constipation, likely multifactorial however mostly exacerbated by opioids given he is on a pain patch currently, also about to start iron daily which will likely worsen his constipation.  Discussed adding Metamucil daily to his regimen, has had good improvement with Linzess in the past but unable to afford it.  Advised to start patient assistance today and provide some additional samples today.  PLAN   Proceed with upper endoscopy with dilation and colonoscopy with propofol by Dr. Jena Gauss in near future: the risks, benefits, and alternatives have been discussed with the patient in detail. The patient states understanding and desires to proceed. ASA 3 Trilyte prep Linzess 145 daily for 4 days prior Linzess  145 mcg, samples provided.  Recommended Metamucil 1 tablespoon daily Patient assistance for Linzess Continue Nexium 20 mg once daily Follow up in 3 months    Brooke Bonito, MSN, FNP-BC,  AGACNP-BC [facility]

## 2023-09-10 NOTE — Patient Instructions (Addendum)
 We will schedule you for upper endoscopy and colonoscopy in the near future with Dr. Jena Gauss.  We will send you detailed written instructions regarding your prep.  I will provide you some samples of Linzess 145 mcg for you to take as needed to help with constipation.  On a daily basis I would recommend you start 1 tablespoon of Metamucil daily in 8 ounces of water.  We will try to submit for patient assistance for Linzess for you given the cost.  Continue Nexium 20 mg once daily. I strongly advise for you to take this on a daily basis as it will help with your reflux symptoms and could potentially help with your swallowing issue.  Follow-up in 3 months.  It was a pleasure to see you today. I want to create trusting relationships with patients. If you receive a survey regarding your visit,  I greatly appreciate you taking time to fill this out on paper or through your MyChart. I value your feedback.  Brooke Bonito, MSN, FNP-BC, AGACNP-BC [facility]

## 2023-09-11 ENCOUNTER — Other Ambulatory Visit: Payer: Self-pay | Admitting: Cardiology

## 2023-09-12 DIAGNOSIS — M79631 Pain in right forearm: Secondary | ICD-10-CM | POA: Diagnosis not present

## 2023-09-12 DIAGNOSIS — Z79891 Long term (current) use of opiate analgesic: Secondary | ICD-10-CM | POA: Diagnosis not present

## 2023-09-12 DIAGNOSIS — M25561 Pain in right knee: Secondary | ICD-10-CM | POA: Diagnosis not present

## 2023-09-12 DIAGNOSIS — G894 Chronic pain syndrome: Secondary | ICD-10-CM | POA: Diagnosis not present

## 2023-09-12 DIAGNOSIS — M545 Low back pain, unspecified: Secondary | ICD-10-CM | POA: Diagnosis not present

## 2023-10-09 ENCOUNTER — Telehealth: Payer: Self-pay | Admitting: *Deleted

## 2023-10-09 ENCOUNTER — Encounter: Payer: Self-pay | Admitting: *Deleted

## 2023-10-09 MED ORDER — PEG 3350-KCL-NA BICARB-NACL 420 G PO SOLR: 4000.0000 mL | Freq: Once | ORAL | 0 refills | Status: AC

## 2023-10-09 NOTE — Telephone Encounter (Signed)
 PA PENDING COHERE Pending: In clinical review Tracking #[id number]

## 2023-10-09 NOTE — Addendum Note (Signed)
 Addended by: Armstead Peaks on: 10/09/2023 01:24 PM   Modules accepted: Orders

## 2023-10-09 NOTE — Telephone Encounter (Signed)
 LMOVM return call  TCS/EGD/ED w/Dr. Jena Gauss, ASA 3

## 2023-10-09 NOTE — Telephone Encounter (Signed)
 Spoke with pt. He has been scheduled for 5/21. Aware will send instructions and let know when he has pre-op appt.

## 2023-10-10 DIAGNOSIS — M545 Low back pain, unspecified: Secondary | ICD-10-CM | POA: Diagnosis not present

## 2023-10-10 DIAGNOSIS — G894 Chronic pain syndrome: Secondary | ICD-10-CM | POA: Diagnosis not present

## 2023-10-10 DIAGNOSIS — Z79891 Long term (current) use of opiate analgesic: Secondary | ICD-10-CM | POA: Diagnosis not present

## 2023-10-10 DIAGNOSIS — M79631 Pain in right forearm: Secondary | ICD-10-CM | POA: Diagnosis not present

## 2023-10-10 DIAGNOSIS — M25561 Pain in right knee: Secondary | ICD-10-CM | POA: Diagnosis not present

## 2023-10-10 NOTE — Telephone Encounter (Signed)
 PA approved via cohere Approved Authorization #[id number] DOS: 11/20/2023 - 12/21/2023

## 2023-10-30 ENCOUNTER — Other Ambulatory Visit: Payer: Self-pay | Admitting: Cardiology

## 2023-11-07 DIAGNOSIS — M79631 Pain in right forearm: Secondary | ICD-10-CM | POA: Diagnosis not present

## 2023-11-07 DIAGNOSIS — M25561 Pain in right knee: Secondary | ICD-10-CM | POA: Diagnosis not present

## 2023-11-07 DIAGNOSIS — Z79891 Long term (current) use of opiate analgesic: Secondary | ICD-10-CM | POA: Diagnosis not present

## 2023-11-07 DIAGNOSIS — G894 Chronic pain syndrome: Secondary | ICD-10-CM | POA: Diagnosis not present

## 2023-11-07 DIAGNOSIS — M545 Low back pain, unspecified: Secondary | ICD-10-CM | POA: Diagnosis not present

## 2023-11-08 NOTE — Progress Notes (Deleted)
 HPI: FU CAD s/p DES to LAD and BMS to Dx in 2003, PTCA LAD 9/18, HL, HTN, AFlutter (transient on treadmill - declined anticoagulation). Since last seen,   Studies:  - LHC (10/2001):  Mid LAD 70%, Ostial D1 80%, CFX and RCA normal, EF 65%.  PCI:  Cypher DES to LAD and Express BMS to Dx.   -   LHC September 2018 showed anterior infarct secondary to partially occlusive thrombus in prior LAD stent successfully treated with heparin , Aggrastat  and balloon angioplasty. No other obstructive disease noted. Dual antiplatelet therapy recommended long-term.  - Echocardiogram November 2022 showed normal LV function, moderate left ventricular hypertrophy, grade 1 diastolic dysfunction, mild aortic insufficiency..  - Nuclear (11/19):  No ischemia, EF 69%,   - Abdominal ultrasound (8/23): No AAA  - Lower extremity venous Dopplers September 2020 negative.  - Carotid Dopplers November 2022 showed no significant stenosis.  Current Outpatient Medications  Medication Sig Dispense Refill   amLODipine  (NORVASC ) 10 MG tablet TAKE 1 TABLET EVERY MORNING 90 tablet 3   atorvastatin  (LIPITOR ) 80 MG tablet TAKE 1 TABLET EVERY DAY 90 tablet 1   buprenorphine (BUTRANS) 10 MCG/HR PTWK 1 patch once a week.     esomeprazole  (NEXIUM ) 20 MG capsule Take 20 mg by mouth daily as needed (acid reflux).     furosemide  (LASIX ) 20 MG tablet Take 20 mg by mouth daily as needed for edema.     irbesartan  (AVAPRO ) 300 MG tablet Take 1 tablet (300 mg total) by mouth daily. Please keep upcoming May appt for further refills. Thank you 30 tablet 0   metoprolol  succinate (TOPROL -XL) 100 MG 24 hr tablet TAKE 1 TABLET EVERY DAY 90 tablet 3   polyethylene glycol (MIRALAX  / GLYCOLAX ) 17 g packet Take 17 g by mouth 2 (two) times daily. (Patient not taking: Reported on 09/10/2023) 14 each 0   No current facility-administered medications for this visit.     Past Medical History:  Diagnosis Date   Acute ST elevation myocardial infarction  (STEMI) of anterior wall (HCC) 03/07/2017   Arthritis    BPH (benign prostatic hypertrophy)    CAD (coronary artery disease)    a. Lexiscan  Myoview  (11/2013):  Normal stress nuclear study.  LV Ejection Fraction: 65%   Complication of anesthesia    hx PONV   Degenerative joint disease    GERD (gastroesophageal reflux disease)    H/O hiatal hernia    History of atrial flutter    REMOTE HX ON TREADMILL   History of colon polyps    Hyperlipidemia    Hypertension    IBS (irritable bowel syndrome)    Lower urinary tract symptoms (LUTS)    OSA (obstructive sleep apnea) 03/26/2016   RBBB (right bundle branch block)    S/P drug eluting coronary stent placement    11-26-2001   X2 DES  TO LAD &  BMS   Schatzki's ring    Status post dilation of esophageal narrowing     Past Surgical History:  Procedure Laterality Date   APPENDECTOMY     CARDIOVASCULAR STRESS TEST  12/09/2013   NUCLEAR LEXISCAN  STUDY--  RESULTS PENDING   CARDIOVASCULAR STRESS TEST  04/2011  DR Paighton Godette   NORMAL /  NO ISCHEMIA/  EF 67%   CHOLECYSTECTOMY     COLONOSCOPY  05/29/2011   RMR: tubular adenoma   COLONOSCOPY WITH PROPOFOL  N/A 05/23/2020   ;  Surgeon: Suzette Espy, MD;  Nonbleeding internal hemorrhoids, diverticulosis in  sigmoid and descending colon, 10 polyps removed.Pathology revealed tubular adenomas.  Recommended repeat colonoscopy in 3 years if health permits.   CORONARY ANGIOPLASTY WITH STENT PLACEMENT  11-26-2001  DR Lavina Pou   DES (Cypher) to LAD 70%/  BMS (Express) to OSTIAL D1 80%/   CFX  &  RCA normal/  EF 65%   CORONARY/GRAFT ACUTE MI REVASCULARIZATION N/A 03/07/2017   Procedure: Coronary/Graft Acute MI Revascularization;  Surgeon: Arnoldo Lapping, MD;  Location: [facility] INVASIVE CV LAB;  Service: Cardiovascular;  Laterality: N/A;   ESOPHAGOGASTRODUODENOSCOPY  04/05/2004   RMR: Small hiatal hernia/Questionable subtle Schatzki's ring, status post dilation as described   ESOPHAGOGASTRODUODENOSCOPY   05/2011   RMR: small hiatal hernia, empiric dilation with 68F and 36F Maloney   ESOPHAGOGASTRODUODENOSCOPY (EGD) WITH ESOPHAGEAL DILATION N/A 01/16/2013   Dr. Rourk:baggy somewhat atonic esophagus-query underlying esophageal motility disorder. Status post passage of a Maloney dilator (large bore) empirically. Status post esophageal biopsy/Reflux symptoms are well controlled on Nexium .benign path   ESOPHAGOGASTRODUODENOSCOPY (EGD) WITH PROPOFOL  N/A 05/23/2020   Surgeon: Suzette Espy, MD; Somewhat baggy esophageal body, otherwise normal esophagus s/p empiric dilation, small hiatal hernia, otherwise normal exam.   LEFT HEART CATH AND CORONARY ANGIOGRAPHY N/A 03/07/2017   Procedure: LEFT HEART CATH AND CORONARY ANGIOGRAPHY;  Surgeon: Arnoldo Lapping, MD;  Location: [facility] INVASIVE CV LAB;  Service: Cardiovascular;  Laterality: N/A;   MALONEY DILATION N/A 05/23/2020   Procedure: Londa Rival DILATION;  Surgeon: Suzette Espy, MD;  Location: [facility];  Service: Endoscopy;  Laterality: N/A;   POLYPECTOMY  05/23/2020   Procedure: POLYPECTOMY;  Surgeon: Suzette Espy, MD;  Location: [facility];  Service: Endoscopy;;   TOTAL HIP ARTHROPLASTY Left 09/07/2003   TOTAL KNEE ARTHROPLASTY Left 07/27/2002   TOTAL KNEE ARTHROPLASTY Right 11/27/2022   Procedure: TOTAL KNEE ARTHROPLASTY;  Surgeon: Claiborne Crew, MD;  Location: [facility];  Service: Orthopedics;  Laterality: Right;   TRANSURETHRAL RESECTION OF PROSTATE N/A 12/21/2013   Procedure: TRANSURETHRAL RESECTION OF THE PROSTATE WITH GYRUS INSTRUMENTS;  Surgeon: Roque Collar, MD;  Location: [facility];  Service: Urology;  Laterality: N/A;    Social History   Socioeconomic History   Marital status: Married    Spouse name: Not on file   Number of children: 2   Years of education: Not on file   Highest education level: Not on file  Occupational History   Occupation: retired  Tobacco Use   Smoking status: Never   Smokeless  tobacco: Never  Vaping Use   Vaping status: Never Used  Substance and Sexual Activity   Alcohol use: Not Currently    Comment: on holidays   Drug use: No   Sexual activity: Never  Other Topics Concern   Not on file  Social History Narrative   Retired from [employer] at home with wife   Right handed   Caffeine: none   Social Drivers of Corporate investment banker Strain: Not on file  Food Insecurity: No Food Insecurity (11/27/2022)   Hunger Vital Sign    Worried About Running Out of Food in the Last Year: Never true    Ran Out of Food in the Last Year: Never true  Transportation Needs: No Transportation Needs (11/27/2022)   PRAPARE - Administrator, Civil Service (Medical): No    Lack of Transportation (Non-Medical): No  Physical Activity: Not on file  Stress: Not on file  Social Connections: Unknown (11/14/2021)  [facility], [facility]   Social Network    Social Network: Not on file  Intimate Partner Violence: Not At Risk (11/27/2022)   Humiliation, Afraid, Rape, and Kick questionnaire    Fear of Current or Ex-Partner: No    Emotionally Abused: No    Physically Abused: No    Sexually Abused: No    Family History  Problem Relation Age of Onset   Cancer Father        Lung   Hypertension Mother    Coronary artery disease Mother    Kidney failure Mother    Cancer Sister        breast carcinoma   Hypertension Sister    Colon cancer Neg Hx     ROS: no fevers or chills, productive cough, hemoptysis, dysphasia, odynophagia, melena, hematochezia, dysuria, hematuria, rash, seizure activity, orthopnea, PND, pedal edema, claudication. Remaining systems are negative.  Physical Exam: Well-developed well-nourished in no acute distress.  Skin is warm and dry.  HEENT is normal.  Neck is supple.  Chest is clear to auscultation with normal expansion.  Cardiovascular exam is regular rate and rhythm.  Abdominal exam nontender  or distended. No masses palpated. Extremities show no edema. neuro grossly intact  ECG- personally reviewed  A/P  1 coronary artery disease-patient denies chest pain.  Continue aspirin  and statin.  2 hypertension-blood pressure controlled.  Continue present medical regimen.  3 hyperlipidemia-continue statin.  4 history of transient atrial flutter during previous treadmill.  Will continue beta-blocker at present dose.  He has declined anticoagulation.  5 lower extremity edema-continue diuretic at present dose.  Alexandria Angel, MD

## 2023-11-15 NOTE — Patient Instructions (Signed)
 Gregory Wilson  11/15/2023     @PREFPERIOPPHARMACY @   Your procedure is scheduled on 11/20/2023.   Report to [facility] at 6:00 A.M.   Call this number if you have problems the morning of surgery:  [phone]  If you experience any cold or flu symptoms such as cough, fever, chills, shortness of breath, etc. between now and your scheduled surgery, please notify us  at the above number.   Remember:   Please follow the diet and prep instructions given to you by Dr Drucilla Georgis office.   You may drink clear liquids until 3:30AM .  Clear liquids allowed are:                    Water , Juice (No red color; non-citric and without pulp; diabetics please choose diet or no sugar options), Carbonated beverages (diabetics please choose diet or no sugar options), Clear Tea (No creamer, milk, or cream, including half & half and powdered creamer), Black Coffee Only (No creamer, milk or cream, including half & half and powdered creamer), and Clear Sports drink (No red color; diabetics please choose diet or no sugar options)    Take these medicines the morning of surgery with A SIP OF WATER  : Norvasc  Nexium  and Metoprolol     Do not wear jewelry, make-up or nail polish, including gel polish,  artificial nails, or any other type of covering on natural nails (fingers and  toes).  Do not wear lotions, powders, or perfumes, or deodorant.  Do not shave 48 hours prior to surgery.  Men may shave face and neck.  Do not bring valuables to the hospital.  [facility] is not responsible for any belongings or valuables.  Contacts, dentures or bridgework may not be worn into surgery.  Leave your suitcase in the car.  After surgery it may be brought to your room.  For patients admitted to the hospital, discharge time will be determined by your treatment team.  Patients discharged the day of surgery will not be allowed to drive home.   Name and phone number of your driver:   Family  Special instructions:   N/A  Please read over the following fact sheets that you were given.  Care and Recovery After Surgery  Colonoscopy, Adult A colonoscopy is a procedure to look at the entire large intestine. This procedure is done using a long, thin, flexible tube that has a camera on the end. You may have a colonoscopy: As a part of normal colorectal screening. If you have certain symptoms, such as: A low number of red blood cells in your blood (anemia). Diarrhea that does not go away. Pain in your abdomen. Blood in your stool. A colonoscopy can help screen for and diagnose medical problems, including: An abnormal growth of cells or tissue (tumor). Abnormal growths within the lining of your intestine (polyps). Inflammation. Areas of bleeding. Tell your health care provider about: Any allergies you have. All medicines you are taking, including vitamins, herbs, eye drops, creams, and over-the-counter medicines. Any problems you or family members have had with anesthetic medicines. Any bleeding problems you have. Any surgeries you have had. Any medical conditions you have. Any problems you have had with having bowel movements. Whether you are pregnant or may be pregnant. What are the risks? Generally, this is a safe procedure. However, problems may occur, including: Bleeding. Damage to your intestine. Allergic reactions to medicines given during the procedure. Infection. This is rare. What happens before the procedure?  Eating and drinking restrictions Follow instructions from your health care provider about eating or drinking restrictions, which may include: A few days before the procedure: Follow a low-fiber diet. Avoid nuts, seeds, dried fruit, raw fruits, and vegetables. 1-3 days before the procedure: Eat only gelatin dessert or ice pops. Drink only clear liquids, such as water , clear juice, clear broth or bouillon, black coffee or tea, or clear soft drinks or sports drinks. Avoid liquids  that contain red or purple dye. The day of the procedure: Do not eat solid foods. You may continue to drink clear liquids until up to 2 hours before the procedure. Do not eat or drink anything starting 2 hours before the procedure, or within the time period that your health care provider recommends. Bowel prep If you were prescribed a bowel prep to take by mouth (orally) to clean out your colon: Take it as told by your health care provider. Starting the day before your procedure, you will need to drink a large amount of liquid medicine. The liquid will cause you to have many bowel movements of loose stool until your stool becomes almost clear or light green. If your skin or the opening between the buttocks (anus) gets irritated from diarrhea, you may relieve the irritation using: Wipes with medicine in them, such as adult wet wipes with aloe and vitamin E. A product to soothe skin, such as petroleum jelly. If you vomit while drinking the bowel prep: Take a break for up to 60 minutes. Begin the bowel prep again. Call your health care provider if you keep vomiting or you cannot take the bowel prep without vomiting. To clean out your colon, you may also be given: Laxative medicines. These help you have a bowel movement. Instructions for enema use. An enema is liquid medicine injected into your rectum. Medicines Ask your health care provider about: Changing or stopping your regular medicines or supplements. This is especially important if you are taking iron supplements, diabetes medicines, or blood thinners. Taking medicines such as aspirin  and ibuprofen. These medicines can thin your blood. Do not take these medicines unless your health care provider tells you to take them. Taking over-the-counter medicines, vitamins, herbs, and supplements. General instructions Ask your health care provider what steps will be taken to help prevent infection. These may include washing skin with a germ-killing  soap. If you will be going home right after the procedure, plan to have a responsible adult: Take you home from the hospital or clinic. You will not be allowed to drive. Care for you for the time you are told. What happens during the procedure?  An IV will be inserted into one of your veins. You will be given a medicine to make you fall asleep (general anesthetic). You will lie on your side with your knees bent. A lubricant will be put on the tube. Then the tube will be: Inserted into your anus. Gently eased through all parts of your large intestine. Air will be sent into your colon to keep it open. This may cause some pressure or cramping. Images will be taken with the camera and will appear on a screen. A small tissue sample may be removed to be looked at under a microscope (biopsy). The tissue may be sent to a lab for testing if any signs of problems are found. If small polyps are found, they may be removed and checked for cancer cells. When the procedure is finished, the tube will be removed. The procedure may vary  among health care providers and hospitals. What happens after the procedure? Your blood pressure, heart rate, breathing rate, and blood oxygen level will be monitored until you leave the hospital or clinic. You may have a small amount of blood in your stool. You may pass gas and have mild cramping or bloating in your abdomen. This is caused by the air that was used to open your colon during the exam. If you were given a sedative during the procedure, it can affect you for several hours. Do not drive or operate machinery until your health care provider says that it is safe. It is up to you to get the results of your procedure. Ask your health care provider, or the department that is doing the procedure, when your results will be ready. Summary A colonoscopy is a procedure to look at the entire large intestine. Follow instructions from your health care provider about eating and  drinking before the procedure. If you were prescribed an oral bowel prep to clean out your colon, take it as told by your health care provider. During the colonoscopy, a flexible tube with a camera on its end is inserted into the anus and then passed into all parts of the large intestine. This information is not intended to replace advice given to you by your health care provider. Make sure you discuss any questions you have with your health care provider. Document Revised: 07/31/2022 Document Reviewed: 02/08/2021 Elsevier Patient Education  2024 [employer]  Upper Endoscopy, Adult Upper endoscopy is a procedure to look inside the upper GI (gastrointestinal) tract. The upper GI tract is made up of: The esophagus. This is the part of the body that moves food from your mouth to your stomach. The stomach. The duodenum. This is the first part of your small intestine. This procedure is also called esophagogastroduodenoscopy (EGD) or gastroscopy. In this procedure, your health care provider passes a thin, flexible tube (endoscope) through your mouth and down your esophagus into your stomach and into your duodenum. A small camera is attached to the end of the tube. Images from the camera appear on a monitor in the exam room. During this procedure, your health care provider may also remove a small piece of tissue to be sent to a lab and examined under a microscope (biopsy). Your health care provider may do an upper endoscopy to diagnose cancers of the upper GI tract. You may also have this procedure to find the cause of other conditions, such as: Stomach pain. Heartburn. Pain or problems when swallowing. Nausea and vomiting. Stomach bleeding. Stomach ulcers. Tell a health care provider about: Any allergies you have. All medicines you are taking, including vitamins, herbs, eye drops, creams, and over-the-counter medicines. Any problems you or family members have had with anesthetic medicines. Any  bleeding problems you have. Any surgeries you have had. Any medical conditions you have. Whether you are pregnant or may be pregnant. What are the risks? Your healthcare provider will talk with you about risks. These may include: Infection. Bleeding. Allergic reactions to medicines. A tear or hole (perforation) in the esophagus, stomach, or duodenum. What happens before the procedure? When to stop eating and drinking Follow instructions from your health care provider about what you may eat and drink. These may include: 8 hours before your procedure Stop eating most foods. Do not eat meat, fried foods, or fatty foods. Eat only light foods, such as toast or crackers. All liquids are okay except energy drinks and alcohol. 6  hours before your procedure Stop eating. Drink only clear liquids, such as water , clear fruit juice, black coffee, plain tea, and sports drinks. Do not drink energy drinks or alcohol. 2 hours before your procedure Stop drinking all liquids. You may be allowed to take medicines with small sips of water . If you do not follow your health care provider's instructions, your procedure may be delayed or canceled. Medicines Ask your health care provider about: Changing or stopping your regular medicines. This is especially important if you are taking diabetes medicines or blood thinners. Taking medicines such as aspirin  and ibuprofen. These medicines can thin your blood. Do not take these medicines unless your health care provider tells you to take them. Taking over-the-counter medicines, vitamins, herbs, and supplements. General instructions If you will be going home right after the procedure, plan to have a responsible adult: Take you home from the hospital or clinic. You will not be allowed to drive. Care for you for the time you are told. What happens during the procedure?  An IV will be inserted into one of your veins. You may be given one or more of the  following: A medicine to help you relax (sedative). A medicine to numb the throat (local anesthetic). You will lie on your left side on an exam table. Your health care provider will pass the endoscope through your mouth and down your esophagus. Your health care provider will use the scope to check the inside of your esophagus, stomach, and duodenum. Biopsies may be taken. The endoscope will be removed. The procedure may vary among health care providers and hospitals. What happens after the procedure? Your blood pressure, heart rate, breathing rate, and blood oxygen level will be monitored until you leave the hospital or clinic. When your throat is no longer numb, you may be given some fluids to drink. If you were given a sedative during the procedure, it can affect you for several hours. Do not drive or operate machinery until your health care provider says that it is safe. It is up to you to get the results of your procedure. Ask your health care provider, or the department that is doing the procedure, when your results will be ready. Contact a health care provider if you: Have a sore throat that lasts longer than 1 day. Have a fever. Get help right away if you: Vomit blood or your vomit looks like coffee grounds. Have bloody, black, or tarry stools. Have a very bad sore throat or you cannot swallow. Have difficulty breathing or very bad pain in your chest or abdomen. These symptoms may be an emergency. Get help right away. Call 911. Do not wait to see if the symptoms will go away. Do not drive yourself to the hospital. Summary Upper endoscopy is a procedure to look inside the upper GI tract. During the procedure, an IV will be inserted into one of your veins. You may be given a medicine to help you relax. The endoscope will be passed through your mouth and down your esophagus. Follow instructions from your health care provider about what you can eat and drink. This information is not  intended to replace advice given to you by your health care provider. Make sure you discuss any questions you have with your health care provider. Document Revised: 09/27/2021 Document Reviewed: 09/27/2021 Elsevier Patient Education  2024 [employer]  Monitored Anesthesia Care Anesthesia refers to the techniques, procedures, and medicines that help a person stay safe and comfortable during surgery.  Monitored anesthesia care, or sedation, is one type of anesthesia. You may have sedation if you do not need to be asleep for your procedure. Procedures that use sedation may include: Surgery to remove cataracts from your eyes. A dental procedure. A biopsy. This is when a tissue sample is removed and looked at under a microscope. You will be watched closely during your procedure. Your level of sedation or type of anesthesia may be changed to fit your needs. Tell a health care provider about: Any allergies you have. All medicines you are taking, including vitamins, herbs, eye drops, creams, and over-the-counter medicines. Any problems you or family members have had with anesthesia. Any bleeding problems you have. Any surgeries you have had. Any medical conditions or illnesses you have. This includes sleep apnea, cough, fever, or the flu. Whether you are pregnant or may be pregnant. Whether you use cigarettes, alcohol, or drugs. Any use of steroids, whether by mouth or as a cream. What are the risks? Your health care provider will talk with you about risks. These may include: Getting too much medicine (oversedation). Nausea. Allergic reactions to medicines. Trouble breathing. If this happens, a breathing tube may be used to help you breathe. It will be removed when you are awake and breathing on your own. Heart trouble. Lung trouble. Confusion that gets better with time (emergence delirium). What happens before the procedure? When to stop eating and drinking Follow instructions from your  health care provider about what you may eat and drink. These may include: 8 hours before your procedure Stop eating most foods. Do not eat meat, fried foods, or fatty foods. Eat only light foods, such as toast or crackers. All liquids are okay except energy drinks and alcohol. 6 hours before your procedure Stop eating. Drink only clear liquids, such as water , clear fruit juice, black coffee, plain tea, and sports drinks. Do not drink energy drinks or alcohol. 2 hours before your procedure Stop drinking all liquids. You may be allowed to take medicines with small sips of water . If you do not follow your health care provider's instructions, your procedure may be delayed or canceled. Medicines Ask your health care provider about: Changing or stopping your regular medicines. These include any diabetes medicines or blood thinners you take. Taking medicines such as aspirin  and ibuprofen. These medicines can thin your blood. Do not take them unless your health care provider tells you to. Taking over-the-counter medicines, vitamins, herbs, and supplements. Testing You may have an exam or testing. You may have a blood or urine sample taken. General instructions Do not use any products that contain nicotine or tobacco for at least 4 weeks before the procedure. These products include cigarettes, chewing tobacco, and vaping devices, such as e-cigarettes. If you need help quitting, ask your health care provider. If you will be going home right after the procedure, plan to have a responsible adult: Take you home from the hospital or clinic. You will not be allowed to drive. Care for you for the time you are told. What happens during the procedure?  Your blood pressure, heart rate, breathing, level of pain, and blood oxygen level will be monitored. An IV will be inserted into one of your veins. You may be given: A sedative. This helps you relax. Anesthesia. This will: Numb certain areas of your  body. Make you fall asleep for surgery. You will be given medicines as needed to keep you comfortable. The more medicine you are given, the deeper your level  of sedation will be. Your level of sedation may be changed to fit your needs. There are three levels of sedation: Mild sedation. At this level, you may feel awake and relaxed. You will be able to follow directions. Moderate sedation. At this level, you will be sleepy. You may not remember the procedure. Deep sedation. At this level, you will be asleep. You will not remember the procedure. How you get the medicines will depend on your age and the procedure. They may be given as: A pill. This may be taken by mouth (orally) or inserted into the rectum. An injection. This may be into a vein or muscle. A spray through the nose. After your procedure is over, the medicine will be stopped. The procedure may vary among health care providers and hospitals. What happens after the procedure? Your blood pressure, heart rate, breathing rate, and blood oxygen level will be monitored until you leave the hospital or clinic. You may feel sleepy, clumsy, or nauseous. You may not remember what happened during or after the procedure. Sedation can affect you for several hours. Do not drive or use machinery until your health care provider says that it is safe. This information is not intended to replace advice given to you by your health care provider. Make sure you discuss any questions you have with your health care provider. Document Revised: 11/13/2021 Document Reviewed: 11/13/2021 Elsevier Patient Education  2024 [employer].

## 2023-11-16 ENCOUNTER — Encounter: Payer: Self-pay | Admitting: Cardiology

## 2023-11-18 ENCOUNTER — Encounter (HOSPITAL_COMMUNITY): Payer: Self-pay

## 2023-11-18 ENCOUNTER — Encounter (HOSPITAL_COMMUNITY)
Admission: RE | Admit: 2023-11-18 | Discharge: 2023-11-18 | Disposition: A | Discharge status: Home / Self Care | Source: Ambulatory Visit | Attending: Internal Medicine | Admitting: Internal Medicine

## 2023-11-18 VITALS — BP 154/74 | HR 64 | Temp 98.0°F | Resp 18 | Ht 72.0 in | Wt 301.0 lb

## 2023-11-18 DIAGNOSIS — I10 Essential (primary) hypertension: Secondary | ICD-10-CM

## 2023-11-18 DIAGNOSIS — E876 Hypokalemia: Secondary | ICD-10-CM | POA: Diagnosis not present

## 2023-11-18 DIAGNOSIS — I2109 ST elevation (STEMI) myocardial infarction involving other coronary artery of anterior wall: Secondary | ICD-10-CM

## 2023-11-18 DIAGNOSIS — Z01818 Encounter for other preprocedural examination: Secondary | ICD-10-CM | POA: Insufficient documentation

## 2023-11-18 DIAGNOSIS — G4733 Obstructive sleep apnea (adult) (pediatric): Secondary | ICD-10-CM

## 2023-11-18 DIAGNOSIS — T502X5A Adverse effect of carbonic-anhydrase inhibitors, benzothiadiazides and other diuretics, initial encounter: Secondary | ICD-10-CM | POA: Insufficient documentation

## 2023-11-18 DIAGNOSIS — I4510 Unspecified right bundle-branch block: Secondary | ICD-10-CM | POA: Insufficient documentation

## 2023-11-18 HISTORY — DX: Nausea with vomiting, unspecified: Z98.890

## 2023-11-18 HISTORY — DX: Nausea with vomiting, unspecified: R11.2

## 2023-11-18 LAB — BASIC METABOLIC PANEL WITH GFR
Anion gap: 7 (ref 5–15)
BUN: 15 mg/dL (ref 8–23)
CO2: 22 mmol/L (ref 22–32)
Calcium: 8.9 mg/dL (ref 8.9–10.3)
Chloride: 105 mmol/L (ref 98–111)
Creatinine, Ser: 1.12 mg/dL (ref 0.61–1.24)
GFR, Estimated: >60 mL/min (ref >60)
Glucose, Bld: 118 mg/dL — ABNORMAL HIGH (ref 70–99)
Potassium: 4 mmol/L (ref 3.5–5.1)
Sodium: 134 mmol/L — ABNORMAL LOW (ref 135–145)

## 2023-11-20 ENCOUNTER — Ambulatory Visit (HOSPITAL_BASED_OUTPATIENT_CLINIC_OR_DEPARTMENT_OTHER)

## 2023-11-20 ENCOUNTER — Encounter (HOSPITAL_COMMUNITY): Payer: Self-pay | Admitting: Internal Medicine

## 2023-11-20 ENCOUNTER — Ambulatory Visit (HOSPITAL_COMMUNITY)
Admission: RE | Admit: 2023-11-20 | Discharge: 2023-11-20 | Disposition: A | Discharge status: Home / Self Care | Attending: Internal Medicine | Admitting: Internal Medicine

## 2023-11-20 ENCOUNTER — Ambulatory Visit (HOSPITAL_COMMUNITY)

## 2023-11-20 ENCOUNTER — Encounter (HOSPITAL_COMMUNITY)
Admission: RE | Disposition: A | Discharge status: Home / Self Care | Payer: Self-pay | Source: Home / Self Care | Attending: Internal Medicine

## 2023-11-20 DIAGNOSIS — G4733 Obstructive sleep apnea (adult) (pediatric): Secondary | ICD-10-CM | POA: Insufficient documentation

## 2023-11-20 DIAGNOSIS — K219 Gastro-esophageal reflux disease without esophagitis: Secondary | ICD-10-CM | POA: Insufficient documentation

## 2023-11-20 DIAGNOSIS — I2510 Atherosclerotic heart disease of native coronary artery without angina pectoris: Secondary | ICD-10-CM

## 2023-11-20 DIAGNOSIS — K640 First degree hemorrhoids: Secondary | ICD-10-CM | POA: Insufficient documentation

## 2023-11-20 DIAGNOSIS — I4510 Unspecified right bundle-branch block: Secondary | ICD-10-CM | POA: Diagnosis not present

## 2023-11-20 DIAGNOSIS — Z1211 Encounter for screening for malignant neoplasm of colon: Secondary | ICD-10-CM | POA: Insufficient documentation

## 2023-11-20 DIAGNOSIS — R1310 Dysphagia, unspecified: Secondary | ICD-10-CM | POA: Diagnosis not present

## 2023-11-20 DIAGNOSIS — I10 Essential (primary) hypertension: Secondary | ICD-10-CM

## 2023-11-20 DIAGNOSIS — K589 Irritable bowel syndrome without diarrhea: Secondary | ICD-10-CM | POA: Diagnosis not present

## 2023-11-20 DIAGNOSIS — D123 Benign neoplasm of transverse colon: Secondary | ICD-10-CM | POA: Diagnosis not present

## 2023-11-20 DIAGNOSIS — I252 Old myocardial infarction: Secondary | ICD-10-CM | POA: Insufficient documentation

## 2023-11-20 DIAGNOSIS — Z860100 Personal history of colon polyps, unspecified: Secondary | ICD-10-CM

## 2023-11-20 DIAGNOSIS — Z955 Presence of coronary angioplasty implant and graft: Secondary | ICD-10-CM | POA: Diagnosis not present

## 2023-11-20 HISTORY — PX: ESOPHAGOGASTRODUODENOSCOPY: SHX5428

## 2023-11-20 HISTORY — PX: COLONOSCOPY: SHX5424

## 2023-11-20 HISTORY — PX: ESOPHAGEAL DILATION: SHX303

## 2023-11-20 SURGERY — COLONOSCOPY
Anesthesia: General

## 2023-11-20 MED ORDER — GLYCOPYRROLATE PF 0.2 MG/ML IJ SOSY
PREFILLED_SYRINGE | INTRAMUSCULAR | Status: DC | PRN
  Administered 2023-11-20: .2 mg via INTRAVENOUS

## 2023-11-20 MED ORDER — LIDOCAINE 2% (20 MG/ML) 5 ML SYRINGE
INTRAMUSCULAR | Status: DC | PRN
  Administered 2023-11-20: 100 mg via INTRAVENOUS

## 2023-11-20 MED ORDER — PROPOFOL 500 MG/50ML IV EMUL
INTRAVENOUS | Status: DC | PRN
  Administered 2023-11-20: 200 ug/kg/min via INTRAVENOUS

## 2023-11-20 MED ORDER — EPHEDRINE SULFATE-NACL 50-0.9 MG/10ML-% IV SOSY
PREFILLED_SYRINGE | INTRAVENOUS | Status: DC | PRN
Start: 2023-11-20 — End: 2023-11-20
  Administered 2023-11-20 (×2): 10 mg via INTRAVENOUS

## 2023-11-20 MED ORDER — PHENYLEPHRINE 80 MCG/ML (10ML) SYRINGE FOR IV PUSH (FOR BLOOD PRESSURE SUPPORT)
PREFILLED_SYRINGE | INTRAVENOUS | Status: DC | PRN
  Administered 2023-11-20: 160 ug via INTRAVENOUS

## 2023-11-20 MED ORDER — LACTATED RINGERS IV SOLN: INTRAVENOUS | Status: DC | PRN

## 2023-11-20 MED ORDER — PROPOFOL 10 MG/ML IV BOLUS
INTRAVENOUS | Status: DC | PRN
Start: 2023-11-20 — End: 2023-11-20
  Administered 2023-11-20: 50 mg via INTRAVENOUS

## 2023-11-20 NOTE — Anesthesia Preprocedure Evaluation (Addendum)
 Anesthesia Evaluation  Patient identified by MRN, date of birth, ID band Patient awake    Reviewed: Allergy & Precautions, H&P , NPO status , Patient's Chart, lab work & pertinent test results  History of Anesthesia Complications (+) PONV and history of anesthetic complications  Airway Mallampati: II  TM Distance: >3 FB Neck ROM: Full    Dental no notable dental hx.    Pulmonary shortness of breath, sleep apnea    Pulmonary exam normal breath sounds clear to auscultation       Cardiovascular hypertension, + CAD and + Past MI  Normal cardiovascular exam+ dysrhythmias  Rhythm:Regular Rate:Normal  Ef 60-65%   Neuro/Psych  Neuromuscular disease  negative psych ROS   GI/Hepatic Neg liver ROS, hiatal hernia,GERD  ,,  Endo/Other  negative endocrine ROS    Renal/GU negative Renal ROS  negative genitourinary   Musculoskeletal  (+) Arthritis ,    Abdominal   Peds negative pediatric ROS (+)  Hematology negative hematology ROS (+)   Anesthesia Other Findings   Reproductive/Obstetrics negative OB ROS                             Anesthesia Physical Anesthesia Plan  ASA: 3  Anesthesia Plan: General   Post-op Pain Management:    Induction: Intravenous  PONV Risk Score and Plan:   Airway Management Planned:   Additional Equipment:   Intra-op Plan:   Post-operative Plan:   Informed Consent: I have reviewed the patients History and Physical, chart, labs and discussed the procedure including the risks, benefits and alternatives for the proposed anesthesia with the patient or authorized representative who has indicated his/her understanding and acceptance.     Dental advisory given  Plan Discussed with: CRNA  Anesthesia Plan Comments:        Anesthesia Quick Evaluation

## 2023-11-20 NOTE — H&P (Signed)
 @LOGO @   Primary Care Physician:  Omie Bickers, MD Primary Gastroenterologist:  Dr. Riley Cheadle  Pre-Procedure History & Physical: HPI:  Gregory Wilson is a 83 y.o. male here for for further management of recurrent esophageal dysphagia via EGD and dilation along with a surveillance colonoscopy history of numerous colonic polyps removed 2021.  Past Medical History:  Diagnosis Date   Acute ST elevation myocardial infarction (STEMI) of anterior wall (HCC) 03/07/2017   Arthritis    BPH (benign prostatic hypertrophy)    CAD (coronary artery disease)    a. Lexiscan  Myoview  (11/2013):  Normal stress nuclear study.  LV Ejection Fraction: 65%   Complication of anesthesia    hx PONV   Degenerative joint disease    GERD (gastroesophageal reflux disease)    H/O hiatal hernia    History of atrial flutter    REMOTE HX ON TREADMILL   History of colon polyps    Hyperlipidemia    Hypertension    IBS (irritable bowel syndrome)    Lower urinary tract symptoms (LUTS)    OSA (obstructive sleep apnea) 03/26/2016   PONV (postoperative nausea and vomiting)    RBBB (right bundle branch block)    S/P drug eluting coronary stent placement    11-26-2001   X2 DES  TO LAD &  BMS   Schatzki's ring    Status post dilation of esophageal narrowing     Past Surgical History:  Procedure Laterality Date   APPENDECTOMY     CARDIOVASCULAR STRESS TEST  12/09/2013   NUCLEAR LEXISCAN  STUDY--  RESULTS PENDING   CARDIOVASCULAR STRESS TEST  04/2011  DR CRENSHAW   NORMAL /  NO ISCHEMIA/  EF 67%   CHOLECYSTECTOMY     COLONOSCOPY  05/29/2011   RMR: tubular adenoma   COLONOSCOPY WITH PROPOFOL  N/A 05/23/2020   ;  Surgeon: Suzette Espy, MD;  Nonbleeding internal hemorrhoids, diverticulosis in sigmoid and descending colon, 10 polyps removed.Pathology revealed tubular adenomas.  Recommended repeat colonoscopy in 3 years if health permits.   CORONARY ANGIOPLASTY WITH STENT PLACEMENT  11-26-2001  DR Lavina Pou   DES  (Cypher) to LAD 70%/  BMS (Express) to OSTIAL D1 80%/   CFX  &  RCA normal/  EF 65%   CORONARY/GRAFT ACUTE MI REVASCULARIZATION N/A 03/07/2017   Procedure: Coronary/Graft Acute MI Revascularization;  Surgeon: Arnoldo Lapping, MD;  Location: [facility] INVASIVE CV LAB;  Service: Cardiovascular;  Laterality: N/A;   ESOPHAGOGASTRODUODENOSCOPY  04/05/2004   RMR: Small hiatal hernia/Questionable subtle Schatzki's ring, status post dilation as described   ESOPHAGOGASTRODUODENOSCOPY  05/2011   RMR: small hiatal hernia, empiric dilation with 20F and 77F Maloney   ESOPHAGOGASTRODUODENOSCOPY (EGD) WITH ESOPHAGEAL DILATION N/A 01/16/2013   Dr. Persis Graffius:baggy somewhat atonic esophagus-query underlying esophageal motility disorder. Status post passage of a Maloney dilator (large bore) empirically. Status post esophageal biopsy/Reflux symptoms are well controlled on Nexium .benign path   ESOPHAGOGASTRODUODENOSCOPY (EGD) WITH PROPOFOL  N/A 05/23/2020   Surgeon: Suzette Espy, MD; Somewhat baggy esophageal body, otherwise normal esophagus s/p empiric dilation, small hiatal hernia, otherwise normal exam.   LEFT HEART CATH AND CORONARY ANGIOGRAPHY N/A 03/07/2017   Procedure: LEFT HEART CATH AND CORONARY ANGIOGRAPHY;  Surgeon: Arnoldo Lapping, MD;  Location: [facility] INVASIVE CV LAB;  Service: Cardiovascular;  Laterality: N/A;   MALONEY DILATION N/A 05/23/2020   Procedure: Londa Rival DILATION;  Surgeon: Suzette Espy, MD;  Location: [facility] ENDO SUITE;  Service: Endoscopy;  Laterality: N/A;   POLYPECTOMY  05/23/2020   Procedure:  POLYPECTOMY;  Surgeon: Suzette Espy, MD;  Location: [facility] ENDO SUITE;  Service: Endoscopy;;   TOTAL HIP ARTHROPLASTY Left 09/07/2003   TOTAL KNEE ARTHROPLASTY Left 07/27/2002   TOTAL KNEE ARTHROPLASTY Right 11/27/2022   Procedure: TOTAL KNEE ARTHROPLASTY;  Surgeon: Claiborne Crew, MD;  Location: [facility] ORS;  Service: Orthopedics;  Laterality: Right;   TRANSURETHRAL RESECTION OF PROSTATE N/A 12/21/2013   Procedure:  TRANSURETHRAL RESECTION OF THE PROSTATE WITH GYRUS INSTRUMENTS;  Surgeon: Roque Collar, MD;  Location: [facility];  Service: Urology;  Laterality: N/A;    Prior to Admission medications   Medication Sig Start Date End Date Taking? Authorizing Provider  amLODipine  (NORVASC ) 10 MG tablet TAKE 1 TABLET EVERY MORNING 04/10/23  Yes Lenise Quince, MD  atorvastatin  (LIPITOR ) 80 MG tablet TAKE 1 TABLET EVERY DAY 09/11/23  Yes Lenise Quince, MD  buprenorphine (BUTRANS) 10 MCG/HR PTWK 1 patch once a week. 03/19/23  Yes [provider]  esomeprazole  (NEXIUM ) 20 MG capsule Take 20 mg by mouth daily as needed (acid reflux).   Yes [provider]  furosemide  (LASIX ) 20 MG tablet Take 20 mg by mouth daily as needed for edema. 02/05/22  Yes [provider]  irbesartan  (AVAPRO ) 300 MG tablet Take 1 tablet (300 mg total) by mouth daily. Please keep upcoming May appt for further refills. Thank you 11/01/23  Yes Lenise Quince, MD  metoprolol  succinate (TOPROL -XL) 100 MG 24 hr tablet TAKE 1 TABLET EVERY DAY 06/05/23  Yes Crenshaw, Deannie Fabian, MD  polyethylene glycol (MIRALAX  / GLYCOLAX ) 17 g packet Take 17 g by mouth 2 (two) times daily. 11/28/22  Yes Kim Pen R, PA-C    Allergies as of 10/09/2023 - Review Complete 09/10/2023  Allergen Reaction Noted   Codeine Nausea And Vomiting 10/28/2008    Family History  Problem Relation Age of Onset   Cancer Father        Lung   Hypertension Mother    Coronary artery disease Mother    Kidney failure Mother    Cancer Sister        breast carcinoma   Hypertension Sister    Colon cancer Neg Hx     Social History   Socioeconomic History   Marital status: Married    Spouse name: Not on file   Number of children: 2   Years of education: Not on file   Highest education level: Not on file  Occupational History   Occupation: retired  Tobacco Use   Smoking status: Never   Smokeless tobacco: Never  Vaping  Use   Vaping status: Never Used  Substance and Sexual Activity   Alcohol use: Not Currently    Comment: on holidays   Drug use: No   Sexual activity: Never  Other Topics Concern   Not on file  Social History Narrative   Retired from [employer] at home with wife   Right handed   Caffeine: none   Social Drivers of Corporate investment banker Strain: Not on file  Food Insecurity: No Food Insecurity (11/27/2022)   Hunger Vital Sign    Worried About Running Out of Food in the Last Year: Never true    Ran Out of Food in the Last Year: Never true  Transportation Needs: No Transportation Needs (11/27/2022)   PRAPARE - Administrator, Civil Service (Medical): No    Lack of Transportation (Non-Medical): No  Physical Activity: Not on file  Stress: Not on file  Social Connections: Unknown (11/14/2021)   Received [facility]   Social Network    Social Network: Not on file  Intimate Partner Violence: Not At Risk (11/27/2022)   Humiliation, Afraid, Rape, and Kick questionnaire    Fear of Current or Ex-Partner: No    Emotionally Abused: No    Physically Abused: No    Sexually Abused: No    Review of Systems: See HPI, otherwise negative ROS  Physical Exam: BP (!) 168/86 (BP Location: Right Arm)   Temp 98.3 F (36.8 C)   Resp 18   Ht 6' (1.829 m)   Wt (!) 136.5 kg   SpO2 100%   BMI 40.81 kg/m  General:   Alert,  Well-developed, well-nourished, pleasant and cooperative in NAD SNeck:  Supple; no masses or thyromegaly. No significant cervical adenopathy. Lungs:  Clear throughout to auscultation.   No wheezes, crackles, or rhonchi. No acute distress. Heart:  Regular rate and rhythm; no murmurs, clicks, rubs,  or gallops. Abdomen: Non-distended, normal bowel sounds.  Soft and nontender without appreciable mass or hepatosplenomegaly.   Impression/Plan: 83 year old gentleman with recurrent esophageal dysphagia.  He responded to  empiric dilation previously (no obstructing lesion found) also 10 adenomas removed 2021 he is here for surveillance colonoscopy as well.  The risks, benefits, limitations, imponderables and alternatives regarding both EGD and colonoscopy have been reviewed with the patient. Questions have been answered. All parties agreeable.       Notice: This dictation was prepared with Dragon dictation along with smaller phrase technology. Any transcriptional errors that result from this process are unintentional and may not be corrected upon review.

## 2023-11-20 NOTE — Transfer of Care (Signed)
 Immediate Anesthesia Transfer of Care Note  Patient: Gregory Wilson  Procedure(s) Performed: COLONOSCOPY EGD (ESOPHAGOGASTRODUODENOSCOPY) DILATION, ESOPHAGUS  Patient Location: Short Stay  Anesthesia Type:General  Level of Consciousness: drowsy and patient cooperative  Airway & Oxygen Therapy: Patient Spontanous Breathing  Post-op Assessment: Report given to RN, Post -op Vital signs reviewed and stable, and Patient moving all extremities X 4  Post vital signs: Reviewed and stable  Last Vitals:  Vitals Value Taken Time  BP 134/64 11/20/23 0817  Temp 36.4 C 11/20/23 0817  Pulse 74 11/20/23 0817  Resp 16 0817  SpO2 98 % 11/20/23 0817    Last Pain:  Vitals:   11/20/23 0817  TempSrc: Oral  PainSc: 0-No pain      Patients Stated Pain Goal: 6 (11/20/23 0817)  Complications: No notable events documented.

## 2023-11-20 NOTE — Anesthesia Postprocedure Evaluation (Signed)
 Anesthesia Post Note  Patient: SUNDEEP DESTIN  Procedure(s) Performed: COLONOSCOPY EGD (ESOPHAGOGASTRODUODENOSCOPY) DILATION, ESOPHAGUS  Patient location during evaluation: PACU Anesthesia Type: General Level of consciousness: awake and alert Pain management: pain level controlled Vital Signs Assessment: post-procedure vital signs reviewed and stable Respiratory status: spontaneous breathing, nonlabored ventilation, respiratory function stable and patient connected to nasal cannula oxygen Cardiovascular status: stable and blood pressure returned to baseline Postop Assessment: no apparent nausea or vomiting Anesthetic complications: no  No notable events documented.   Last Vitals:  Vitals:   11/20/23 0830 11/20/23 0831  BP:  126/74  Pulse: 84 81  Resp: 15 19  Temp:    SpO2: 98% 99%    Last Pain:  Vitals:   11/20/23 0817  TempSrc: Oral  PainSc: 0-No pain                 Beacher Limerick

## 2023-11-20 NOTE — Op Note (Signed)
 [facility] Patient Name: Gregory Wilson Procedure Date: 11/20/2023 7:20 AM MRN: [id number] Date of Birth: 10-05-1940 Attending MD: Gemma Kelp , MD, [id number] CSN: [id number] Age: 83 Admit Type: Outpatient Procedure:                Upper GI endoscopy Indications:              Dysphagia Providers:                Gemma Kelp, MD, Alisa App, Crystal                            Page, Annell Barrow Referring MD:              Medicines:                Propofol  per Anesthesia Complications:            No immediate complications. Estimated Blood Loss:     Estimated blood loss was minimal. Procedure:                Pre-Anesthesia Assessment:                           - Prior to the procedure, a History and Physical                            was performed, and patient medications and                            allergies were reviewed. The patient's tolerance of                            previous anesthesia was also reviewed. The risks                            and benefits of the procedure and the sedation                            options and risks were discussed with the patient.                            All questions were answered, and informed consent                            was obtained. Prior Anticoagulants: The patient has                            taken no anticoagulant or antiplatelet agents. ASA                            Grade Assessment: III - A patient with severe                            systemic disease. After reviewing the risks and  benefits, the patient was deemed in satisfactory                            condition to undergo the procedure.                           After obtaining informed consent, the endoscope was                            passed under direct vision. Throughout the                            procedure, the patient's blood pressure, pulse, and                            oxygen saturations  were monitored continuously. The                            [id number]-H190 ([id number]) scope was introduced through the                            mouth, and advanced to the second part of duodenum.                            The upper GI endoscopy was accomplished without                            difficulty. The patient tolerated the procedure                            well. Scope In: 7:46:20 AM Scope Out: 7:53:12 AM Total Procedure Duration: 0 hours 6 minutes 52 seconds  Findings:      The duodenal bulb and second portion of the duodenum were normal.      The examined esophagus was normal. The scope was withdrawn. Dilation was       performed with a Maloney dilator with mild resistance at 56 Fr. The       scope was withdrawn. Dilation was performed with a Maloney dilator with       mild resistance at 58 Fr. The dilation site was examined following       endoscope reinsertion and showed mild mucosal disruption. Estimated       blood loss was minimal.      The entire examined stomach was normal. Impression:               - Normal duodenal bulb and second portion of the                            duodenum.                           - Normal esophagus. Dilated.                           - Normal stomach.                           -  No specimens collected. Moderate Sedation:      Moderate (conscious) sedation was personally administered by an       anesthesia professional. The following parameters were monitored: oxygen       saturation, heart rate, blood pressure, respiratory rate, EKG, adequacy       of pulmonary ventilation, and response to care. Recommendation:           - Patient has a contact number available for                            emergencies. The signs and symptoms of potential                            delayed complications were discussed with the                            patient. Return to normal activities tomorrow.                            Written discharge  instructions were provided to the                            patient.                           - Advance diet as tolerated.                           - Continue present medications. Continue                            esomeprazole  20 mg daily. Take every day without                            fail.                           - Return to my office in 6 months. See colonoscopy                            report. Procedure Code(s):        --- Professional ---                           [phone], Esophagogastroduodenoscopy, flexible,                            transoral; diagnostic, including collection of                            specimen(s) by brushing or washing, when performed                            (separate procedure)                           43450, Dilation of esophagus, by unguided sound  or                            bougie, single or multiple passes Diagnosis Code(s):        --- Professional ---                           R13.10, Dysphagia, unspecified CPT copyright 2022 American Medical Association. All rights reserved. The codes documented in this report are preliminary and upon coder review may  be revised to meet current compliance requirements. [name]. Abaigeal Moomaw, MD Gemma Kelp, MD 11/20/2023 9:03:32 AM This report has been signed electronically. Number of Addenda: 0

## 2023-11-20 NOTE — Discharge Instructions (Signed)
 EGD Discharge instructions Please read the instructions outlined below and refer to this sheet in the next few weeks. These discharge instructions provide you with general information on caring for yourself after you leave the hospital. Your doctor may also give you specific instructions. While your treatment has been planned according to the most current medical practices available, unavoidable complications occasionally occur. If you have any problems or questions after discharge, please call your doctor. ACTIVITY You may resume your regular activity but move at a slower pace for the next 24 hours.  Take frequent rest periods for the next 24 hours.  Walking will help expel (get rid of) the air and reduce the bloated feeling in your abdomen.  No driving for 24 hours (because of the anesthesia (medicine) used during the test).  You may shower.  Do not sign any important legal documents or operate any machinery for 24 hours (because of the anesthesia used during the test).  NUTRITION Drink plenty of fluids.  You may resume your normal diet.  Begin with a light meal and progress to your normal diet.  Avoid alcoholic beverages for 24 hours or as instructed by your caregiver.  MEDICATIONS You may resume your normal medications unless your caregiver tells you otherwise.  WHAT YOU CAN EXPECT TODAY You may experience abdominal discomfort such as a feeling of fullness or "gas" pains.  FOLLOW-UP Your doctor will discuss the results of your test with you.  SEEK IMMEDIATE MEDICAL ATTENTION IF ANY OF THE FOLLOWING OCCUR: Excessive nausea (feeling sick to your stomach) and/or vomiting.  Severe abdominal pain and distention (swelling).  Trouble swallowing.  Temperature over 101 F (37.8 C).  Rectal bleeding or vomiting of blood.     Colonoscopy Discharge Instructions  Read the instructions outlined below and refer to this sheet in the next few weeks. These discharge instructions provide you with  general information on caring for yourself after you leave the hospital. Your doctor may also give you specific instructions. While your treatment has been planned according to the most current medical practices available, unavoidable complications occasionally occur. If you have any problems or questions after discharge, call Dr. Riley Cheadle at [phone]. ACTIVITY You may resume your regular activity, but move at a slower pace for the next 24 hours.  Take frequent rest periods for the next 24 hours.  Walking will help get rid of the air and reduce the bloated feeling in your belly (abdomen).  No driving for 24 hours (because of the medicine (anesthesia) used during the test).   Do not sign any important legal documents or operate any machinery for 24 hours (because of the anesthesia used during the test).  NUTRITION Drink plenty of fluids.  You may resume your normal diet as instructed by your doctor.  Begin with a light meal and progress to your normal diet. Heavy or fried foods are harder to digest and may make you feel sick to your stomach (nauseated).  Avoid alcoholic beverages for 24 hours or as instructed.  MEDICATIONS You may resume your normal medications unless your doctor tells you otherwise.  WHAT YOU CAN EXPECT TODAY Some feelings of bloating in the abdomen.  Passage of more gas than usual.  Spotting of blood in your stool or on the toilet paper.  IF YOU HAD POLYPS REMOVED DURING THE COLONOSCOPY: No aspirin  products for 7 days or as instructed.  No alcohol for 7 days or as instructed.  Eat a soft diet for the next 24 hours.  FINDING OUT THE RESULTS OF YOUR TEST Not all test results are available during your visit. If your test results are not back during the visit, make an appointment with your caregiver to find out the results. Do not assume everything is normal if you have not heard from your caregiver or the medical facility. It is important for you to follow up on all of your test  results.  SEEK IMMEDIATE MEDICAL ATTENTION IF: You have more than a spotting of blood in your stool.  Your belly is swollen (abdominal distention).  You are nauseated or vomiting.  You have a temperature over 101.  You have abdominal pain or discomfort that is severe or gets worse throughout the day.     Your esophagus was stretched again today  I recommend you take your esomeprazole  20 mg every day without fail whether you feel you have symptoms or not  1 polyp removed from your colon  Further recommendations to follow pending review of pathology report  Follow-up appointment with us  in 6 months  At patient request, I called Lucilla Saa at [phone] rolled to voicemail I left a detailed message.

## 2023-11-21 ENCOUNTER — Ambulatory Visit: Payer: Medicare PPO | Attending: Cardiology | Admitting: Cardiology

## 2023-11-22 LAB — SURGICAL PATHOLOGY

## 2023-11-23 ENCOUNTER — Other Ambulatory Visit: Payer: Self-pay | Admitting: Cardiology

## 2023-11-26 ENCOUNTER — Encounter (HOSPITAL_COMMUNITY): Payer: Self-pay | Admitting: Internal Medicine

## 2023-11-27 ENCOUNTER — Ambulatory Visit: Payer: Self-pay | Admitting: Internal Medicine

## 2023-11-27 NOTE — Op Note (Signed)
 [facility] Patient Name: Gregory Wilson Procedure Date: 11/20/2023 7:19 AM MRN: [id number] Date of Birth: [birth year] Attending MD: Gemma Kelp , MD, [id number] CSN: [id number] Age: 83 Admit Type: Outpatient Procedure:                Colonoscopy Indications:              High risk colon cancer surveillance: Personal                            history of colonic polyps Providers:                Gemma Kelp, MD, Alisa App, Crystal                            Page, Annell Barrow Referring MD:              Medicines:                Propofol  per Anesthesia Complications:            No immediate complications. Estimated Blood Loss:     Estimated blood loss was minimal. Procedure:                Pre-Anesthesia Assessment:                           - Prior to the procedure, a History and Physical                            was performed, and patient medications and                            allergies were reviewed. The patient's tolerance of                            previous anesthesia was also reviewed. The risks                            and benefits of the procedure and the sedation                            options and risks were discussed with the patient.                            All questions were answered, and informed consent                            was obtained. Prior Anticoagulants: The patient has                            taken no anticoagulant or antiplatelet agents. ASA                            Grade Assessment: III - A patient with severe  systemic disease. After reviewing the risks and                            benefits, the patient was deemed in satisfactory                            condition to undergo the procedure.                           After obtaining informed consent, the colonoscope                            was passed under direct vision. Throughout the                            procedure,  the patient's blood pressure, pulse, and                            oxygen saturations were monitored continuously. The                            [id number] scope was introduced through the                            anus and advanced to the the cecum, identified by                            appendiceal orifice and ileocecal valve. The                            colonoscopy was performed without difficulty. The                            patient tolerated the procedure well. The quality                            of the bowel preparation was adequate. The                            ileocecal valve, appendiceal orifice, and rectum                            were photographed. Scope In: 7:57:54 AM Scope Out: 8:12:58 AM Scope Withdrawal Time: 0 hours 6 minutes 53 seconds  Total Procedure Duration: 0 hours 15 minutes 4 seconds  Findings:      The perianal and digital rectal examinations were normal.      Non-bleeding internal hemorrhoids were found during retroflexion. The       hemorrhoids were moderate, medium-sized and Grade I (internal       hemorrhoids that do not prolapse).      A 5 mm polyp was found in the hepatic flexure. The polyp was       semi-pedunculated. The polyp was removed with a cold snare. Resection       and retrieval were complete. Estimated blood loss was  minimal.      The exam was otherwise without abnormality on direct and retroflexion       views. Impression:               - Non-bleeding internal hemorrhoids.                           - One 5 mm polyp at the hepatic flexure, removed                            with a cold snare. Resected and retrieved.                           - The examination was otherwise normal on direct                            and retroflexion views. Moderate Sedation:      Moderate (conscious) sedation was personally administered by an       anesthesia professional. The following parameters were monitored: oxygen        saturation, heart rate, blood pressure, respiratory rate, EKG, adequacy       of pulmonary ventilation, and response to care. Recommendation:           - Patient has a contact number available for                            emergencies. The signs and symptoms of potential                            delayed complications were discussed with the                            patient. Return to normal activities tomorrow.                            Written discharge instructions were provided to the                            patient.                           - Advance diet as tolerated.                           - Continue present medications.                           - Patient has a contact number available for                            emergencies. The signs and symptoms of potential                            delayed complications were discussed with the  patient. Return to normal activities tomorrow.                            Written discharge instructions were provided to the                            patient.                           - No repeat colonoscopy due to age.                           - Return to GI clinic (date not yet determined). Procedure Code(s):        --- Professional ---                           [phone], Colonoscopy, flexible; with removal of                            tumor(s), polyp(s), or other lesion(s) by snare                            technique Diagnosis Code(s):        --- Professional ---                           Z86.010, Personal history of colonic polyps                           D12.3, Benign neoplasm of transverse colon (hepatic                            flexure or splenic flexure)                           K64.0, First degree hemorrhoids CPT copyright 2022 American Medical Association. All rights reserved. The codes documented in this report are preliminary and upon coder review may  be revised to meet current compliance  requirements. [name]. [name], MD Gemma Kelp, MD 11/27/2023 8:02:52 AM This report has been signed electronically. Number of Addenda: 0

## 2023-11-28 ENCOUNTER — Encounter: Payer: Self-pay | Admitting: Gastroenterology

## 2023-12-05 DIAGNOSIS — F1120 Opioid dependence, uncomplicated: Secondary | ICD-10-CM | POA: Diagnosis not present

## 2023-12-05 DIAGNOSIS — M79631 Pain in right forearm: Secondary | ICD-10-CM | POA: Diagnosis not present

## 2023-12-05 DIAGNOSIS — Z79891 Long term (current) use of opiate analgesic: Secondary | ICD-10-CM | POA: Diagnosis not present

## 2023-12-05 DIAGNOSIS — M545 Low back pain, unspecified: Secondary | ICD-10-CM | POA: Diagnosis not present

## 2023-12-05 DIAGNOSIS — M25561 Pain in right knee: Secondary | ICD-10-CM | POA: Diagnosis not present

## 2023-12-05 DIAGNOSIS — G894 Chronic pain syndrome: Secondary | ICD-10-CM | POA: Diagnosis not present

## 2023-12-25 ENCOUNTER — Ambulatory Visit: Payer: Medicare PPO | Admitting: Cardiology

## 2024-01-02 DIAGNOSIS — M545 Low back pain, unspecified: Secondary | ICD-10-CM | POA: Diagnosis not present

## 2024-01-02 DIAGNOSIS — Z79891 Long term (current) use of opiate analgesic: Secondary | ICD-10-CM | POA: Diagnosis not present

## 2024-01-02 DIAGNOSIS — G894 Chronic pain syndrome: Secondary | ICD-10-CM | POA: Diagnosis not present

## 2024-01-02 DIAGNOSIS — M47816 Spondylosis without myelopathy or radiculopathy, lumbar region: Secondary | ICD-10-CM | POA: Diagnosis not present

## 2024-01-02 DIAGNOSIS — F1120 Opioid dependence, uncomplicated: Secondary | ICD-10-CM | POA: Diagnosis not present

## 2024-01-29 ENCOUNTER — Other Ambulatory Visit: Payer: Self-pay | Admitting: Cardiology

## 2024-01-31 ENCOUNTER — Ambulatory Visit: Admitting: Cardiology

## 2024-01-31 DIAGNOSIS — M79631 Pain in right forearm: Secondary | ICD-10-CM | POA: Diagnosis not present

## 2024-01-31 DIAGNOSIS — Z79891 Long term (current) use of opiate analgesic: Secondary | ICD-10-CM | POA: Diagnosis not present

## 2024-01-31 DIAGNOSIS — G894 Chronic pain syndrome: Secondary | ICD-10-CM | POA: Diagnosis not present

## 2024-01-31 DIAGNOSIS — M545 Low back pain, unspecified: Secondary | ICD-10-CM | POA: Diagnosis not present

## 2024-01-31 DIAGNOSIS — M25561 Pain in right knee: Secondary | ICD-10-CM | POA: Diagnosis not present

## 2024-02-08 ENCOUNTER — Other Ambulatory Visit: Payer: Self-pay | Admitting: Cardiology

## 2024-02-10 NOTE — Progress Notes (Signed)
 Impression/Assessment:  1.  BPH with history of TURP.  He does have urinary urgency that is getting a bit more bothersome  2.  History of initial hematuria, none over the past year  Plan:  Would like to continue yearly office visits.  Overactive bladder guide sheet given    History of Present Illness:   8.12.2024: Status post transurethral resection of the prostate by me in June, 2015.  About 30 g of prostate resected, all benign.  Subsequently seen in 2020 by Dr. Carolee at [facility] urology.  Had initial gross hematuria.  Evaluation revealed probable prostatic source.  8.12.2025: Here today for recheck.  Overall voiding quite well.  He does have worsening urinary urgency.  Rare urgency incontinence.  IPSS 6/4.  No recent hematuria.     Past Medical History:  Diagnosis Date   Acute ST elevation myocardial infarction (STEMI) of anterior wall (HCC) 03/07/2017   Arthritis    BPH (benign prostatic hypertrophy)    CAD (coronary artery disease)    a. Lexiscan  Myoview  (11/2013):  Normal stress nuclear study.  LV Ejection Fraction: 65%   Complication of anesthesia    hx PONV   Degenerative joint disease    GERD (gastroesophageal reflux disease)    H/O hiatal hernia    History of atrial flutter    REMOTE HX ON TREADMILL   History of colon polyps    Hyperlipidemia    Hypertension    IBS (irritable bowel syndrome)    Lower urinary tract symptoms (LUTS)    OSA (obstructive sleep apnea) 03/26/2016   PONV (postoperative nausea and vomiting)    RBBB (right bundle branch block)    S/P drug eluting coronary stent placement    11-26-2001   X2 DES  TO LAD &  BMS   Schatzki's ring    Status post dilation of esophageal narrowing     Past Surgical History:  Procedure Laterality Date   APPENDECTOMY     CARDIOVASCULAR STRESS TEST  12/09/2013   NUCLEAR LEXISCAN  STUDY--  RESULTS PENDING   CARDIOVASCULAR STRESS TEST  04/2011  DR CRENSHAW   NORMAL /  NO ISCHEMIA/  EF 67%   CHOLECYSTECTOMY      COLONOSCOPY  05/29/2011   RMR: tubular adenoma   COLONOSCOPY N/A 11/20/2023   Procedure: COLONOSCOPY;  Surgeon: Shaaron Lamar HERO, MD;  [facility];  Service: Endoscopy;  Laterality: N/A;  730am, asa 3   COLONOSCOPY WITH PROPOFOL  N/A 05/23/2020   ;  Surgeon: Shaaron Lamar HERO, MD;  Nonbleeding internal hemorrhoids, diverticulosis in sigmoid and descending colon, 10 polyps removed.Pathology revealed tubular adenomas.  Recommended repeat colonoscopy in 3 years if health permits.   CORONARY ANGIOPLASTY WITH STENT PLACEMENT  11-26-2001  DR WOLM NIDA   DES (Cypher) to LAD 70%/  BMS (Express) to OSTIAL D1 80%/   CFX  &  RCA normal/  EF 65%   CORONARY/GRAFT ACUTE MI REVASCULARIZATION N/A 03/07/2017   Procedure: Coronary/Graft Acute MI Revascularization;  Surgeon: Wonda Sharper, MD;  Location: [facility] INVASIVE CV LAB;  Service: Cardiovascular;  Laterality: N/A;   ESOPHAGEAL DILATION N/A 11/20/2023   Procedure: DILATION, ESOPHAGUS;  Surgeon: Shaaron Lamar HERO, MD;  [facility];  Service: Endoscopy;  Laterality: N/A;   ESOPHAGOGASTRODUODENOSCOPY  04/05/2004   RMR: Small hiatal hernia/Questionable subtle Schatzki's ring, status post dilation as described   ESOPHAGOGASTRODUODENOSCOPY  05/2011   RMR: small hiatal hernia, empiric dilation with 70F and 67F Maloney   ESOPHAGOGASTRODUODENOSCOPY N/A 11/20/2023  Procedure: EGD (ESOPHAGOGASTRODUODENOSCOPY);  Surgeon: Shaaron Lamar HERO, MD;  [facility];  Service: Endoscopy;  Laterality: N/A;   ESOPHAGOGASTRODUODENOSCOPY (EGD) WITH ESOPHAGEAL DILATION N/A 01/16/2013   Dr. Rourk:baggy somewhat atonic esophagus-query underlying esophageal motility disorder. Status post passage of a Maloney dilator (large bore) empirically. Status post esophageal biopsy/Reflux symptoms are well controlled on Nexium .benign path   ESOPHAGOGASTRODUODENOSCOPY (EGD) WITH PROPOFOL  N/A 05/23/2020   Surgeon: Shaaron Lamar HERO, MD; Somewhat baggy esophageal body,  otherwise normal esophagus s/p empiric dilation, small hiatal hernia, otherwise normal exam.   LEFT HEART CATH AND CORONARY ANGIOGRAPHY N/A 03/07/2017   Procedure: LEFT HEART CATH AND CORONARY ANGIOGRAPHY;  Surgeon: Wonda Sharper, MD;  [facility]: [facility] INVASIVE CV LAB;  Service: Cardiovascular;  Laterality: N/A;   MALONEY DILATION N/A 05/23/2020   Procedure: AGAPITO DILATION;  Surgeon: Shaaron Lamar HERO, MD;  [facility];  Service: Endoscopy;  Laterality: N/A;   POLYPECTOMY  05/23/2020   Procedure: POLYPECTOMY;  Surgeon: Shaaron Lamar HERO, MD;  [facility];  Service: Endoscopy;;   TOTAL HIP ARTHROPLASTY Left 09/07/2003   TOTAL KNEE ARTHROPLASTY Left 07/27/2002   TOTAL KNEE ARTHROPLASTY Right 11/27/2022   Procedure: TOTAL KNEE ARTHROPLASTY;  Surgeon: Ernie Cough, MD;  Location: [facility];  Service: Orthopedics;  Laterality: Right;   TRANSURETHRAL RESECTION OF PROSTATE N/A 12/21/2013   Procedure: TRANSURETHRAL RESECTION OF THE PROSTATE WITH GYRUS INSTRUMENTS;  Surgeon: Garnette HERO Shack, MD;  Location: [facility];  Service: Urology;  Laterality: N/A;    Home Medications:  Allergies as of 02/11/2024       Reactions   Codeine Nausea And Vomiting        Medication List        Accurate as of February 10, 2024 12:54 PM. If you have any questions, ask your nurse or doctor.          amLODipine  10 MG tablet Commonly known as: NORVASC  TAKE 1 TABLET EVERY MORNING   atorvastatin  80 MG tablet Commonly known as: LIPITOR  TAKE 1 TABLET EVERY DAY   buprenorphine 10 MCG/HR Ptwk Commonly known as: BUTRANS 1 patch once a week.   esomeprazole  20 MG capsule Commonly known as: NEXIUM  Take 20 mg by mouth daily as needed (acid reflux).   furosemide  20 MG tablet Commonly known as: LASIX  Take 20 mg by mouth daily as needed for edema.   irbesartan  300 MG tablet Commonly known as: AVAPRO  TAKE 1 TABLET EVERY DAY (MUST KEEP APPT FOR REFILLS)   metoprolol   succinate 100 MG 24 hr tablet Commonly known as: TOPROL -XL TAKE 1 TABLET EVERY DAY   polyethylene glycol 17 g packet Commonly known as: MIRALAX  / GLYCOLAX  Take 17 g by mouth 2 (two) times daily.        Allergies:  Allergies  Allergen Reactions   Codeine Nausea And Vomiting    Family History  Problem Relation Age of Onset   Cancer Father        Lung   Hypertension Mother    Coronary artery disease Mother    Kidney failure Mother    Cancer Sister        breast carcinoma   Hypertension Sister    Colon cancer Neg Hx     Social History:  reports that he has never smoked. He has never used smokeless tobacco. He reports that he does not currently use alcohol. He reports that he does not use drugs.  ROS: A complete review of systems was performed.  All  systems are negative except for pertinent findings as noted.  Physical Exam:  Vital signs in last 24 hours: There were no vitals taken for this visit. Constitutional:  Alert and oriented, No acute distress Cardiovascular: Regular rate  Respiratory: Normal respiratory effort Neurologic: Grossly intact, no focal deficits Psychiatric: Normal mood and affect   I have reviewed notes from previous visits [facility]  I have reviewed urinalysis results--clear  I have reviewed prior PSA and pathology results

## 2024-02-11 ENCOUNTER — Ambulatory Visit (INDEPENDENT_AMBULATORY_CARE_PROVIDER_SITE_OTHER): Payer: Medicare PPO | Admitting: Urology

## 2024-02-11 VITALS — BP 157/75 | HR 55

## 2024-02-11 DIAGNOSIS — R3915 Urgency of urination: Secondary | ICD-10-CM | POA: Diagnosis not present

## 2024-02-11 DIAGNOSIS — N401 Enlarged prostate with lower urinary tract symptoms: Secondary | ICD-10-CM | POA: Diagnosis not present

## 2024-02-11 DIAGNOSIS — N400 Enlarged prostate without lower urinary tract symptoms: Secondary | ICD-10-CM

## 2024-02-12 LAB — URINALYSIS, ROUTINE W REFLEX MICROSCOPIC
Bilirubin, UA: NEGATIVE
Glucose, UA: NEGATIVE
Ketones, UA: NEGATIVE
Leukocytes,UA: NEGATIVE
Nitrite, UA: NEGATIVE
Protein,UA: NEGATIVE
RBC, UA: NEGATIVE
Specific Gravity, UA: 1.015 (ref 1.005–1.030)
Urobilinogen, Ur: 0.2 mg/dL (ref 0.2–1.0)
pH, UA: 7 (ref 5.0–7.5)

## 2024-02-28 DIAGNOSIS — M25561 Pain in right knee: Secondary | ICD-10-CM | POA: Diagnosis not present

## 2024-02-28 DIAGNOSIS — M79631 Pain in right forearm: Secondary | ICD-10-CM | POA: Diagnosis not present

## 2024-02-28 DIAGNOSIS — M545 Low back pain, unspecified: Secondary | ICD-10-CM | POA: Diagnosis not present

## 2024-03-19 ENCOUNTER — Encounter: Payer: Self-pay | Admitting: Internal Medicine

## 2024-03-23 ENCOUNTER — Other Ambulatory Visit: Payer: Self-pay | Admitting: Cardiology

## 2024-03-24 NOTE — Progress Notes (Signed)
 HPI: FU CAD s/p DES to LAD and BMS to Dx in 2003, PTCA LAD 9/18, HL, HTN, AFlutter (transient on treadmill - declined anticoagulation). Since last seen,     Studies:  - LHC (10/2001):  Mid LAD 70%, Ostial D1 80%, CFX and RCA normal, EF 65%.  PCI:  Cypher DES to LAD and Express BMS to Dx.   -   LHC September 2018 showed anterior infarct secondary to partially occlusive thrombus in prior LAD stent successfully treated with heparin , Aggrastat  and balloon angioplasty. No other obstructive disease noted. Dual antiplatelet therapy recommended long-term.  - Echocardiogram November 2022 showed normal LV function, moderate left ventricular hypertrophy, grade 1 diastolic dysfunction, mild aortic insufficiency.  - Nuclear (11/19):  No ischemia, EF 69%,   - Abdominal ultrasound (9/23): No abdominal aortic aneurysm noted.  - Lower extremity venous Dopplers September 2020 negative.  - Carotid Dopplers November 2022 showed no significant stenosis.  Current Outpatient Medications  Medication Sig Dispense Refill   amLODipine  (NORVASC ) 10 MG tablet TAKE 1 TABLET EVERY MORNING 90 tablet 0   atorvastatin  (LIPITOR ) 80 MG tablet TAKE 1 TABLET EVERY DAY 30 tablet 1   buprenorphine (BUTRANS) 10 MCG/HR PTWK 1 patch once a week.     esomeprazole  (NEXIUM ) 20 MG capsule Take 20 mg by mouth daily as needed (acid reflux).     furosemide  (LASIX ) 20 MG tablet Take 20 mg by mouth daily as needed for edema.     irbesartan  (AVAPRO ) 300 MG tablet TAKE 1 TABLET EVERY DAY (MUST KEEP APPT FOR REFILLS) 30 tablet 1   metoprolol  succinate (TOPROL -XL) 100 MG 24 hr tablet TAKE 1 TABLET EVERY DAY 90 tablet 0   polyethylene glycol (MIRALAX  / GLYCOLAX ) 17 g packet Take 17 g by mouth 2 (two) times daily. (Patient not taking: Reported on 03/31/2024) 14 each 0   No current facility-administered medications for this visit.     Past Medical History:  Diagnosis Date   Acute ST elevation myocardial infarction (STEMI) of anterior wall  (HCC) 03/07/2017   Arthritis    BPH (benign prostatic hypertrophy)    CAD (coronary artery disease)    a. Lexiscan  Myoview  (11/2013):  Normal stress nuclear study.  LV Ejection Fraction: 65%   Complication of anesthesia    hx PONV   Degenerative joint disease    GERD (gastroesophageal reflux disease)    H/O hiatal hernia    History of atrial flutter    REMOTE HX ON TREADMILL   History of colon polyps    Hyperlipidemia    Hypertension    IBS (irritable bowel syndrome)    Lower urinary tract symptoms (LUTS)    OSA (obstructive sleep apnea) 03/26/2016   PONV (postoperative nausea and vomiting)    RBBB (right bundle branch block)    S/P drug eluting coronary stent placement    11-26-2001   X2 DES  TO LAD &  BMS   Schatzki's ring    Status post dilation of esophageal narrowing     Past Surgical History:  Procedure Laterality Date   APPENDECTOMY     CARDIOVASCULAR STRESS TEST  12/09/2013   NUCLEAR LEXISCAN  STUDY--  RESULTS PENDING   CARDIOVASCULAR STRESS TEST  04/2011  DR Shanin Szymanowski   NORMAL /  NO ISCHEMIA/  EF 67%   CHOLECYSTECTOMY     COLONOSCOPY  05/29/2011   RMR: tubular adenoma   COLONOSCOPY N/A 11/20/2023   Procedure: COLONOSCOPY;  Surgeon: Shaaron Lamar HERO, MD;  [facility];  Service: Endoscopy;  Laterality: N/A;  730am, asa 3   COLONOSCOPY WITH PROPOFOL  N/A 05/23/2020   ;  Surgeon: Shaaron Lamar HERO, MD;  Nonbleeding internal hemorrhoids, diverticulosis in sigmoid and descending colon, 10 polyps removed.Pathology revealed tubular adenomas.  Recommended repeat colonoscopy in 3 years if health permits.   CORONARY ANGIOPLASTY WITH STENT PLACEMENT  11-26-2001  DR WOLM NIDA   DES (Cypher) to LAD 70%/  BMS (Express) to OSTIAL D1 80%/   CFX  &  RCA normal/  EF 65%   CORONARY/GRAFT ACUTE MI REVASCULARIZATION N/A 03/07/2017   Procedure: Coronary/Graft Acute MI Revascularization;  Surgeon: Wonda Sharper, MD;  Location: [facility] INVASIVE CV LAB;  Service: Cardiovascular;   Laterality: N/A;   ESOPHAGEAL DILATION N/A 11/20/2023   Procedure: DILATION, ESOPHAGUS;  Surgeon: Shaaron Lamar HERO, MD;  [facility];  Service: Endoscopy;  Laterality: N/A;   ESOPHAGOGASTRODUODENOSCOPY  04/05/2004   RMR: Small hiatal hernia/Questionable subtle Schatzki's ring, status post dilation as described   ESOPHAGOGASTRODUODENOSCOPY  05/2011   RMR: small hiatal hernia, empiric dilation with 46F and 43F Maloney   ESOPHAGOGASTRODUODENOSCOPY N/A 11/20/2023   Procedure: EGD (ESOPHAGOGASTRODUODENOSCOPY);  Surgeon: Shaaron Lamar HERO, MD;  [facility];  Service: Endoscopy;  Laterality: N/A;   ESOPHAGOGASTRODUODENOSCOPY (EGD) WITH ESOPHAGEAL DILATION N/A 01/16/2013   Dr. Rourk:baggy somewhat atonic esophagus-query underlying esophageal motility disorder. Status post passage of a Maloney dilator (large bore) empirically. Status post esophageal biopsy/Reflux symptoms are well controlled on Nexium .benign path   ESOPHAGOGASTRODUODENOSCOPY (EGD) WITH PROPOFOL  N/A 05/23/2020   Surgeon: Shaaron Lamar HERO, MD; Somewhat baggy esophageal body, otherwise normal esophagus s/p empiric dilation, small hiatal hernia, otherwise normal exam.   LEFT HEART CATH AND CORONARY ANGIOGRAPHY N/A 03/07/2017   Procedure: LEFT HEART CATH AND CORONARY ANGIOGRAPHY;  Surgeon: Wonda Sharper, MD;  Location: [facility] INVASIVE CV LAB;  Service: Cardiovascular;  Laterality: N/A;   MALONEY DILATION N/A 05/23/2020   Procedure: AGAPITO DILATION;  Surgeon: Shaaron Lamar HERO, MD;  [facility];  Service: Endoscopy;  Laterality: N/A;   POLYPECTOMY  05/23/2020   Procedure: POLYPECTOMY;  Surgeon: Shaaron Lamar HERO, MD;  [facility];  Service: Endoscopy;;   TOTAL HIP ARTHROPLASTY Left 09/07/2003   TOTAL KNEE ARTHROPLASTY Left 07/27/2002   TOTAL KNEE ARTHROPLASTY Right 11/27/2022   Procedure: TOTAL KNEE ARTHROPLASTY;  Surgeon: Ernie Cough, MD;  Location: [facility];  Service: Orthopedics;  Laterality: Right;    TRANSURETHRAL RESECTION OF PROSTATE N/A 12/21/2013   Procedure: TRANSURETHRAL RESECTION OF THE PROSTATE WITH GYRUS INSTRUMENTS;  Surgeon: Garnette HERO Shack, MD;  Location: [facility];  Service: Urology;  Laterality: N/A;    Social History   Socioeconomic History   Marital status: Married    Spouse name: Not on file   Number of children: 2   Years of education: Not on file   Highest education level: Not on file  Occupational History   Occupation: retired  Tobacco Use   Smoking status: Never   Smokeless tobacco: Never  Vaping Use   Vaping status: Never Used  Substance and Sexual Activity   Alcohol use: Not Currently    Comment: on holidays   Drug use: No   Sexual activity: Never  Other Topics Concern   Not on file  Social History Narrative   Retired from [employer] at home with wife   Right handed   Caffeine: none   Social Drivers of [employer]  Resource Strain: Not on file  Food Insecurity: No Food Insecurity (11/27/2022)   Hunger Vital Sign    Worried About Running Out of Food in the Last Year: Never true    Ran Out of Food in the Last Year: Never true  Transportation Needs: No Transportation Needs (11/27/2022)   PRAPARE - Administrator, Civil Service (Medical): No    Lack of Transportation (Non-Medical): No  Physical Activity: Not on file  Stress: Not on file  Social Connections: Unknown (11/14/2021)   Received from [facility]   Social Network    Social Network: Not on file  Intimate Partner Violence: Not At Risk (11/27/2022)   Humiliation, Afraid, Rape, and Kick questionnaire    Fear of Current or Ex-Partner: No    Emotionally Abused: No    Physically Abused: No    Sexually Abused: No    Family History  Problem Relation Age of Onset   Cancer Father        Lung   Hypertension Mother    Coronary artery disease Mother    Kidney failure Mother    Cancer Sister        breast carcinoma   Hypertension  Sister    Colon cancer Neg Hx     ROS: no fevers or chills, productive cough, hemoptysis, dysphasia, odynophagia, melena, hematochezia, dysuria, hematuria, rash, seizure activity, orthopnea, PND, pedal edema, claudication. Remaining systems are negative.  Physical Exam: Well-developed well-nourished in no acute distress.  Skin is warm and dry.  HEENT is normal.  Neck is supple.  Chest is clear to auscultation with normal expansion.  Cardiovascular exam is regular rate and rhythm.  Abdominal exam nontender or distended. No masses palpated. Extremities show 1+ edema. neuro grossly intact  A/P  1 coronary artery disease-patient denies chest pain.  Continue aspirin  and statin.  2 hypertension-blood pressure controlled.  Continue present medical regimen.  3 hyperlipidemia-continue statin.  Lipids and liver monitored by primary care.  4 history of lower extremity edema-this appears to be controlled on present dose of diuretic.  Will continue.  5 history of transient atrial flutter during exercise treadmill-will continue beta-blocker.  He previously declined anticoagulation.  Redell Shallow, MD

## 2024-03-31 ENCOUNTER — Encounter: Payer: Self-pay | Admitting: Cardiology

## 2024-03-31 ENCOUNTER — Ambulatory Visit: Attending: Cardiology | Admitting: Cardiology

## 2024-03-31 VITALS — BP 136/82 | HR 67 | Ht 72.0 in | Wt 305.0 lb

## 2024-03-31 DIAGNOSIS — E785 Hyperlipidemia, unspecified: Secondary | ICD-10-CM | POA: Diagnosis not present

## 2024-03-31 DIAGNOSIS — I2510 Atherosclerotic heart disease of native coronary artery without angina pectoris: Secondary | ICD-10-CM

## 2024-03-31 DIAGNOSIS — Z9861 Coronary angioplasty status: Secondary | ICD-10-CM | POA: Diagnosis not present

## 2024-03-31 DIAGNOSIS — I10 Essential (primary) hypertension: Secondary | ICD-10-CM | POA: Diagnosis not present

## 2024-03-31 NOTE — Patient Instructions (Signed)
 Medication Instructions:  Your physician recommends that you continue on your current medications as directed. Please refer to the Current Medication list given to you today.  *If you need a refill on your cardiac medications before your next appointment, please call your pharmacy*  Lab Work: None ordered.  You may go to any [facility] Location for your lab work:  [facility] - [address] ([facility]) - [address] [phone] [address]  [place] - [address]   [place]  - [address]   [place] - [address] - [address] [place] [place]  - [address] [place] [address] ([facility]   If you have labs (blood work) drawn today and your tests are completely normal, you will receive your results only by: [employer] (if you have MyChart)  If you have any lab test that is abnormal or we need to change your treatment, we will call you or send a MyChart message to review the results.  Testing/Procedures: None ordered.  Follow-Up: At [facility], you and your health needs are our priority.  As part of our continuing mission to provide you with exceptional heart care, we have created designated Provider Care Teams.  These Care Teams include your primary Cardiologist (physician) and Advanced Practice Providers (APPs -  Physician Assistants and Nurse Practitioners) who all work together to provide you with the care you need, when you need it.   Your next appointment:   1 year(s)  The format for your next appointment:   In Person  Provider:   Redell Shallow, MD

## 2024-04-02 DIAGNOSIS — F1120 Opioid dependence, uncomplicated: Secondary | ICD-10-CM | POA: Diagnosis not present

## 2024-04-02 DIAGNOSIS — M545 Low back pain, unspecified: Secondary | ICD-10-CM | POA: Diagnosis not present

## 2024-04-02 DIAGNOSIS — M47816 Spondylosis without myelopathy or radiculopathy, lumbar region: Secondary | ICD-10-CM | POA: Diagnosis not present

## 2024-04-02 DIAGNOSIS — Z79891 Long term (current) use of opiate analgesic: Secondary | ICD-10-CM | POA: Diagnosis not present

## 2024-04-02 DIAGNOSIS — G894 Chronic pain syndrome: Secondary | ICD-10-CM | POA: Diagnosis not present

## 2024-04-04 ENCOUNTER — Other Ambulatory Visit: Payer: Self-pay | Admitting: Cardiology

## 2024-04-15 ENCOUNTER — Encounter: Payer: Self-pay | Admitting: Internal Medicine

## 2024-04-21 ENCOUNTER — Encounter: Payer: Self-pay | Admitting: Internal Medicine

## 2024-04-30 DIAGNOSIS — G894 Chronic pain syndrome: Secondary | ICD-10-CM | POA: Diagnosis not present

## 2024-04-30 DIAGNOSIS — M79631 Pain in right forearm: Secondary | ICD-10-CM | POA: Diagnosis not present

## 2024-04-30 DIAGNOSIS — M545 Low back pain, unspecified: Secondary | ICD-10-CM | POA: Diagnosis not present

## 2024-04-30 DIAGNOSIS — G8929 Other chronic pain: Secondary | ICD-10-CM | POA: Diagnosis not present

## 2024-04-30 DIAGNOSIS — M25561 Pain in right knee: Secondary | ICD-10-CM | POA: Diagnosis not present

## 2024-04-30 DIAGNOSIS — Z79891 Long term (current) use of opiate analgesic: Secondary | ICD-10-CM | POA: Diagnosis not present

## 2024-05-21 DIAGNOSIS — Z79891 Long term (current) use of opiate analgesic: Secondary | ICD-10-CM | POA: Diagnosis not present

## 2024-05-21 DIAGNOSIS — G8929 Other chronic pain: Secondary | ICD-10-CM | POA: Diagnosis not present

## 2024-05-21 DIAGNOSIS — M25561 Pain in right knee: Secondary | ICD-10-CM | POA: Diagnosis not present

## 2024-05-21 DIAGNOSIS — G894 Chronic pain syndrome: Secondary | ICD-10-CM | POA: Diagnosis not present

## 2024-05-21 DIAGNOSIS — M545 Low back pain, unspecified: Secondary | ICD-10-CM | POA: Diagnosis not present

## 2024-05-21 DIAGNOSIS — M79631 Pain in right forearm: Secondary | ICD-10-CM | POA: Diagnosis not present

## 2024-06-05 ENCOUNTER — Other Ambulatory Visit: Payer: Self-pay | Admitting: Cardiology

## 2024-06-09 ENCOUNTER — Encounter: Payer: Self-pay | Admitting: Cardiology

## 2024-06-09 MED ORDER — AMLODIPINE BESYLATE 10 MG PO TABS: 10.0000 mg | ORAL_TABLET | Freq: Every morning | ORAL | 2 refills | Status: AC

## 2024-06-09 MED ORDER — METOPROLOL SUCCINATE ER 100 MG PO TB24: 100.0000 mg | ORAL_TABLET | Freq: Every day | ORAL | 2 refills | Status: AC

## 2025-02-16 ENCOUNTER — Ambulatory Visit: Admitting: Urology
